# Patient Record
Sex: Female | Born: 1945 | Race: White | Hispanic: No | Marital: Single | Smoking: Never smoker
Health system: Southern US, Community
[De-identification: ages and names within clinical notes are randomized; demographics above are authoritative.]

## PROBLEM LIST (undated history)

## (undated) DIAGNOSIS — E7800 Pure hypercholesterolemia, unspecified: Secondary | ICD-10-CM

## (undated) DIAGNOSIS — R600 Localized edema: Secondary | ICD-10-CM

## (undated) DIAGNOSIS — N289 Disorder of kidney and ureter, unspecified: Secondary | ICD-10-CM

## (undated) DIAGNOSIS — J309 Allergic rhinitis, unspecified: Secondary | ICD-10-CM

## (undated) DIAGNOSIS — K5792 Diverticulitis of intestine, part unspecified, without perforation or abscess without bleeding: Secondary | ICD-10-CM

## (undated) DIAGNOSIS — M1990 Unspecified osteoarthritis, unspecified site: Secondary | ICD-10-CM

## (undated) DIAGNOSIS — K219 Gastro-esophageal reflux disease without esophagitis: Secondary | ICD-10-CM

## (undated) DIAGNOSIS — D500 Iron deficiency anemia secondary to blood loss (chronic): Secondary | ICD-10-CM

## (undated) DIAGNOSIS — R5381 Other malaise: Secondary | ICD-10-CM

## (undated) DIAGNOSIS — K922 Gastrointestinal hemorrhage, unspecified: Secondary | ICD-10-CM

## (undated) DIAGNOSIS — M810 Age-related osteoporosis without current pathological fracture: Secondary | ICD-10-CM

## (undated) DIAGNOSIS — E079 Disorder of thyroid, unspecified: Secondary | ICD-10-CM

## (undated) DIAGNOSIS — F419 Anxiety disorder, unspecified: Secondary | ICD-10-CM

## (undated) DIAGNOSIS — E785 Hyperlipidemia, unspecified: Secondary | ICD-10-CM

## (undated) DIAGNOSIS — E559 Vitamin D deficiency, unspecified: Secondary | ICD-10-CM

## (undated) DIAGNOSIS — G4700 Insomnia, unspecified: Secondary | ICD-10-CM

## (undated) DIAGNOSIS — I82A11 Acute embolism and thrombosis of right axillary vein: Secondary | ICD-10-CM

## (undated) DIAGNOSIS — E46 Unspecified protein-calorie malnutrition: Secondary | ICD-10-CM

## (undated) DIAGNOSIS — K5010 Crohn's disease of large intestine without complications: Secondary | ICD-10-CM

## (undated) DIAGNOSIS — G4730 Sleep apnea, unspecified: Secondary | ICD-10-CM

## (undated) DIAGNOSIS — K5090 Crohn's disease, unspecified, without complications: Secondary | ICD-10-CM

## (undated) HISTORY — DX: Unspecified osteoarthritis, unspecified site: M19.90

## (undated) HISTORY — DX: Disorder of thyroid, unspecified: E07.9

## (undated) HISTORY — DX: Hyperlipidemia, unspecified: E78.5

## (undated) HISTORY — DX: Insomnia, unspecified: G47.00

## (undated) HISTORY — DX: Pure hypercholesterolemia, unspecified: E78.00

## (undated) HISTORY — DX: Diverticulitis of intestine, part unspecified, without perforation or abscess without bleeding: K57.92

## (undated) HISTORY — PX: COLONOSCOPY: SHX174

## (undated) HISTORY — DX: Other malaise: R53.81

## (undated) HISTORY — DX: Crohn's disease, unspecified, without complications: K50.90

## (undated) HISTORY — DX: Localized edema: R60.0

## (undated) HISTORY — DX: Unspecified protein-calorie malnutrition: E46

## (undated) HISTORY — DX: Acute embolism and thrombosis of right axillary vein: I82.A11

## (undated) HISTORY — DX: Anxiety disorder, unspecified: F41.9

## (undated) HISTORY — DX: Vitamin D deficiency, unspecified: E55.9

## (undated) HISTORY — DX: Iron deficiency anemia secondary to blood loss (chronic): D50.0

## (undated) HISTORY — DX: Allergic rhinitis, unspecified: J30.9

## (undated) HISTORY — DX: Age-related osteoporosis without current pathological fracture: M81.0

## (undated) HISTORY — DX: Gastro-esophageal reflux disease without esophagitis: K21.9

## (undated) HISTORY — DX: Sleep apnea, unspecified: G47.30

## (undated) HISTORY — DX: Gastrointestinal hemorrhage, unspecified: K92.2

## (undated) HISTORY — DX: Disorder of kidney and ureter, unspecified: N28.9

---
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
## 1976-02-14 HISTORY — PX: PARTIAL HYSTERECTOMY: SHX80

## 2002-10-28 ENCOUNTER — Ambulatory Visit (HOSPITAL_COMMUNITY): Admission: RE | Admit: 2002-10-28 | Discharge: 2002-10-28 | Payer: Self-pay | Admitting: Pulmonary Disease

## 2002-10-28 ENCOUNTER — Encounter (INDEPENDENT_AMBULATORY_CARE_PROVIDER_SITE_OTHER): Payer: Self-pay | Admitting: *Deleted

## 2002-12-26 ENCOUNTER — Ambulatory Visit (HOSPITAL_COMMUNITY): Admission: RE | Admit: 2002-12-26 | Discharge: 2002-12-26 | Payer: Self-pay | Admitting: Internal Medicine

## 2002-12-26 ENCOUNTER — Encounter: Payer: Self-pay | Admitting: Internal Medicine

## 2002-12-26 ENCOUNTER — Encounter (INDEPENDENT_AMBULATORY_CARE_PROVIDER_SITE_OTHER): Payer: Self-pay

## 2005-02-27 ENCOUNTER — Other Ambulatory Visit: Admission: RE | Admit: 2005-02-27 | Discharge: 2005-02-27 | Payer: Self-pay | Admitting: Family Medicine

## 2006-07-24 ENCOUNTER — Encounter: Payer: Self-pay | Admitting: Internal Medicine

## 2008-03-30 ENCOUNTER — Encounter: Payer: Self-pay | Admitting: Internal Medicine

## 2008-06-22 ENCOUNTER — Encounter: Payer: Self-pay | Admitting: Internal Medicine

## 2008-10-08 ENCOUNTER — Encounter: Payer: Self-pay | Admitting: Internal Medicine

## 2009-04-12 ENCOUNTER — Encounter (INDEPENDENT_AMBULATORY_CARE_PROVIDER_SITE_OTHER): Payer: Self-pay | Admitting: *Deleted

## 2009-05-01 ENCOUNTER — Emergency Department (HOSPITAL_COMMUNITY): Admission: EM | Admit: 2009-05-01 | Discharge: 2009-05-01 | Payer: Self-pay | Admitting: Emergency Medicine

## 2009-06-08 DIAGNOSIS — K5732 Diverticulitis of large intestine without perforation or abscess without bleeding: Secondary | ICD-10-CM

## 2009-06-08 DIAGNOSIS — M1990 Unspecified osteoarthritis, unspecified site: Secondary | ICD-10-CM | POA: Insufficient documentation

## 2009-06-08 DIAGNOSIS — Z860100 Personal history of colon polyps, unspecified: Secondary | ICD-10-CM | POA: Insufficient documentation

## 2009-06-08 DIAGNOSIS — E039 Hypothyroidism, unspecified: Secondary | ICD-10-CM

## 2009-06-08 DIAGNOSIS — E785 Hyperlipidemia, unspecified: Secondary | ICD-10-CM | POA: Insufficient documentation

## 2009-06-08 DIAGNOSIS — K5730 Diverticulosis of large intestine without perforation or abscess without bleeding: Secondary | ICD-10-CM

## 2009-06-08 DIAGNOSIS — F411 Generalized anxiety disorder: Secondary | ICD-10-CM

## 2009-06-08 DIAGNOSIS — K219 Gastro-esophageal reflux disease without esophagitis: Secondary | ICD-10-CM

## 2009-06-08 DIAGNOSIS — E559 Vitamin D deficiency, unspecified: Secondary | ICD-10-CM

## 2010-03-15 NOTE — Letter (Signed)
Summary: New Patient letter  [facility]  [address] [place], [place] [address]   Phone: [phone]  Fax: [phone]       04/12/2009 MRN: [id number]  Jamie Wood [address] [place], [place]  [address]  Dear Ms. Wynelle Link,  Welcome to the Gastroenterology Division at [facility].    You are scheduled to see Dr. Juanda Chance on 06-09-09 at 2:45p.m. on the 3rd floor at [facility], [address]  We ask that you try to arrive at our office 15 minutes prior to your appointment time to allow for check-in.  We would like you to complete the enclosed self-administered evaluation form prior to your visit and bring it with you on the day of your appointment.  We will review it with you.  Also, please bring a complete list of all your medications or, if you prefer, bring the medication bottles and we will list them.  Please bring your insurance card so that we may make a copy of it.  If your insurance requires a referral to see a specialist, please bring your referral form from your primary care physician.  Co-payments are due at the time of your visit and may be paid by cash, check or credit card.     Your office visit will consist of a consult with your physician (includes a physical exam), any laboratory testing he/she may order, scheduling of any necessary diagnostic testing (e.g. x-ray, ultrasound, CT-scan), and scheduling of a procedure (e.g. Endoscopy, Colonoscopy) if required.  Please allow enough time on your schedule to allow for any/all of these possibilities.    If you cannot keep your appointment, please call [phone] to cancel or reschedule prior to your appointment date.  This allows [place] the opportunity to schedule an appointment for another patient in need of care.  If you do not cancel or reschedule by 5 p.m. the business day prior to your appointment date, you will be charged a $50.00 late cancellation/no-show fee.    Thank you for choosing [facility] for your medical needs.  We appreciate the opportunity to care for you.  Please visit [place] at our website  to learn more about our practice.                     Sincerely,                                                             The Gastroenterology Division

## 2010-03-15 NOTE — Procedures (Signed)
Summary: COLON   Colonoscopy  Procedure date:  12/26/2002  Findings:      Location:  [facility].    NAME:  Jamie Wood, Jamie Wood                         ACCOUNT NO.:  [id number]   MEDICAL RECORD NO.:  [id number]                   PATIENT TYPE:  AMB   LOCATION:  ENDO                                 FACILITY:  [facility]   PHYSICIAN:  Lina Sar, M.D. [facility]               DATE OF BIRTH:  1946/01/26   DATE OF PROCEDURE:  12/26/2002  DATE OF DISCHARGE:                                 OPERATIVE REPORT   PROCEDURE:  Colonoscopy.   GASTROENTEROLOGIST:  Lina Sar, M.D.   INDICATIONS:  This 65 year old white female has had two documented episodes  of diverticulitis, one eight years ago and one this year in September 2004.  She has residual left lower quadrant tenderness, but she has been off  antibiotics now for six weeks.  She is undergoing colonoscopy to further  evaluate her diverticulosis.   ENDOSCOPE:  Fujinon single-channel video endoscope.   SEDATION:  Versed 10 mg, IV fentanyl 100 mcg IV.   FINDINGS:  Fujinon single-channel video endoscope passed under direct vision  into the rectum into the sigmoid colon.  The patient was monitored by pulse  oximetry. Oxygen saturations were normal.   The rectal ampulla was normal.  Her prep was excellent.  There was severe  diverticulosis of the sigmoid colon between 20 and 40 cm from the rectal  opening.  Lumen was narrow, tortuous with large folds obscuring deep  diverticula.  It was difficult to negotiate through, I actually had to exert  some pressure on the endoscope for it to traverse through the narrow portion  of the colon.  The patient had some discomfort as the sigmoid colon was  negotiated with the endoscope.  Once the endoscope traversed into the  descending duodenum, it appeared to have normal size, and there were  scattered diverticulum throughout the splenic flexure.  Transverse colon,  hepatic flexure, and ascending colon  were traversed without difficulty.  Cecal pouch and ileocecal valve were normal.  Colonoscope was then slowly  retracted through the right to the left colon.  At the level of 20 cm from  the right colon was noted a tiny diminutive polyp measuring about 3 to 4 mm  which was ablated with cold biopsy and sent to pathology.  It was not clear  whether this was actual polyp or inverted diverticulum.   IMPRESSION:  1. Severe diverticulosis of the sigmoid colon with partial narrowing of the     colon lumen.  No evidence of acute diverticulitis.  2. Diminutive polyp of the left colon status post polypectomy.    PLAN:  The patient may be able to increase the fiber content of her diet now  and continue taking Metamucil on a daily basis.  She also should have  available Levsin sublingually 0.125 mg  for antispasmodic effect.  The  patient may in the future require segmental resection of the sigmoid, but at  this point she is doing well and continue conservative treatment.                                               Lina Sar, M.D. [facility]    DB/MEDQ  D:  12/26/2002  T:  12/26/2002  Job:  [id number]   cc:   Talmadge Coventry, M.D.  [address]  [place]  [place] [address]  Fax: [phone]  This report was created from the original endoscopy report, which was reviewed and signed by the above listed endoscopist.    FINAL DIAGNOSIS    ***MICROSCOPIC EXAMINATION AND DIAGNOSIS***    COLON: HYPERPLASTIC POLYP(S). NO ADENOMATOUS CHANGE OR   MALIGNANCY IDENTIFIED.    mw   Date Reported: 12/29/2002 Marcie Bal, MD   *** Electronically Signed Out By TAZ ***    Clinical information   History of diverticulitis. (ac)    specimen(s) obtained   Colon, polyp(s)    Gross Description   Received in formalin is a tan, soft tissue fragment that is   submitted in toto. Size: 0.2 cm   (GP:jes, 12/29/02)    jes/

## 2010-03-15 NOTE — Letter (Signed)
[facility]: [facility]   Imported By: Sherian Rein 06/08/2009 14:00:54  _____________________________________________________________________  External Attachment:    Type:   Image     Comment:   External Document

## 2010-03-15 NOTE — Letter (Signed)
Summary: [facility]   Imported By: Sherian Rein 06/08/2009 14:01:47  _____________________________________________________________________  External Attachment:    Type:   Image     Comment:   External Document

## 2010-07-01 NOTE — Op Note (Signed)
NAME:  Jamie Wood, Jamie Wood                         ACCOUNT NO.:  [id number]   MEDICAL RECORD NO.:  [id number]                   PATIENT TYPE:  AMB   LOCATION:  ENDO                                 FACILITY:  [facility]   PHYSICIAN:  Lina Sar, M.D. [facility]               DATE OF BIRTH:  05/23/1945   DATE OF PROCEDURE:  12/26/2002  DATE OF DISCHARGE:                                 OPERATIVE REPORT   PROCEDURE:  Colonoscopy.   GASTROENTEROLOGIST:  Lina Sar, M.D.   INDICATIONS:  This 65 year old white female has had two documented episodes  of diverticulitis, one eight years ago and one this year in September 2004.  She has residual left lower quadrant tenderness, but she has been off  antibiotics now for six weeks.  She is undergoing colonoscopy to further  evaluate her diverticulosis.   ENDOSCOPE:  Fujinon single-channel video endoscope.   SEDATION:  Versed 10 mg, IV fentanyl 100 mcg IV.   FINDINGS:  Fujinon single-channel video endoscope passed under direct vision  into the rectum into the sigmoid colon.  The patient was monitored by pulse  oximetry. Oxygen saturations were normal.   The rectal ampulla was normal.  Her prep was excellent.  There was severe  diverticulosis of the sigmoid colon between 20 and 40 cm from the rectal  opening.  Lumen was narrow, tortuous with large folds obscuring deep  diverticula.  It was difficult to negotiate through, I actually had to exert  some pressure on the endoscope for it to traverse through the narrow portion  of the colon.  The patient had some discomfort as the sigmoid colon was  negotiated with the endoscope.  Once the endoscope traversed into the  descending duodenum, it appeared to have normal size, and there were  scattered diverticulum throughout the splenic flexure.  Transverse colon,  hepatic flexure, and ascending colon were traversed without difficulty.  Cecal pouch and ileocecal valve were normal.  Colonoscope was then slowly  retracted through the right to the left colon.  At the level of 20 cm from  the right colon was noted a tiny diminutive polyp measuring about 3 to 4 mm  which was ablated with cold biopsy and sent to pathology.  It was not clear  whether this was actual polyp or inverted diverticulum.   IMPRESSION:  1. Severe diverticulosis of the sigmoid colon with partial narrowing of the     colon lumen.  No evidence of acute diverticulitis.  2. Diminutive polyp of the left colon status post polypectomy.    PLAN:  The patient may be able to increase the fiber content of her diet now  and continue taking Metamucil on a daily basis.  She also should have  available Levsin sublingually 0.125 mg for antispasmodic effect.  The  patient may in the future require segmental resection of the sigmoid, but at  this point she is doing  well and continue conservative treatment.                                               Lina Sar, M.D. [facility]    DB/MEDQ  D:  12/26/2002  T:  12/26/2002  Job:  [id number]   cc:   Talmadge Coventry, M.D.  [address]  [place]  [place] [address]  Fax: [phone]

## 2010-10-13 ENCOUNTER — Other Ambulatory Visit: Payer: Self-pay | Admitting: Family Medicine

## 2010-10-13 DIAGNOSIS — R1032 Left lower quadrant pain: Secondary | ICD-10-CM

## 2010-10-18 ENCOUNTER — Ambulatory Visit
Admission: RE | Admit: 2010-10-18 | Discharge: 2010-10-18 | Disposition: A | Discharge status: Home / Self Care | Payer: Federal, State, Local not specified - PPO | Source: Ambulatory Visit | Attending: Family Medicine | Admitting: Family Medicine

## 2010-10-18 DIAGNOSIS — R1032 Left lower quadrant pain: Secondary | ICD-10-CM

## 2010-10-18 MED ORDER — IOHEXOL 300 MG/ML  SOLN: 125.0000 mL | Freq: Once | INTRAMUSCULAR | Status: AC | PRN

## 2012-08-20 ENCOUNTER — Ambulatory Visit: Payer: Self-pay | Admitting: Neurology

## 2012-09-30 ENCOUNTER — Encounter: Payer: Self-pay | Admitting: Internal Medicine

## 2012-10-01 ENCOUNTER — Encounter: Payer: Self-pay | Admitting: Internal Medicine

## 2012-12-04 ENCOUNTER — Ambulatory Visit (AMBULATORY_SURGERY_CENTER): Payer: Self-pay

## 2012-12-04 VITALS — Ht 63.25 in | Wt 215.0 lb

## 2012-12-04 DIAGNOSIS — Z860100 Personal history of colon polyps, unspecified: Secondary | ICD-10-CM

## 2012-12-04 MED ORDER — MOVIPREP 100 G PO SOLR: 1.0000 | Freq: Once | ORAL | Status: DC

## 2012-12-18 ENCOUNTER — Ambulatory Visit (AMBULATORY_SURGERY_CENTER): Payer: Medicare Other | Admitting: Internal Medicine

## 2012-12-18 ENCOUNTER — Encounter: Payer: Self-pay | Admitting: Internal Medicine

## 2012-12-18 VITALS — BP 127/73 | HR 68 | Temp 97.0°F | Resp 13 | Ht 63.0 in | Wt 215.0 lb

## 2012-12-18 DIAGNOSIS — Z1211 Encounter for screening for malignant neoplasm of colon: Secondary | ICD-10-CM

## 2012-12-18 DIAGNOSIS — Z86010 Personal history of colonic polyps: Secondary | ICD-10-CM

## 2012-12-18 MED ORDER — SODIUM CHLORIDE 0.9 % IV SOLN: 500.0000 mL | INTRAVENOUS | Status: DC

## 2012-12-18 NOTE — Op Note (Signed)
[facility] [address] [place], [address]   COLONOSCOPY PROCEDURE REPORT  PATIENT: Jamie Wood, Jamie Wood  MR#: [id number] BIRTHDATE: [birth year] , 67  yrs. old GENDER: Female ENDOSCOPIST: Hart Carwin, MD REFERRED HY:QMVHQIO Cliffton Asters, M.D. PROCEDURE DATE:  12/18/2012 PROCEDURE:   Colonoscopy, screening First Screening Colonoscopy - Avg.  risk and is 50 yrs.  old or older - No.  Prior Negative Screening - Now for repeat screening. N/A  History of Adenoma - Now for follow-up colonoscopy & has been > or = to 3 yrs.  N/A  Polyps Removed Today? No.  Recommend repeat exam, <10 yrs? No. ASA CLASS:   Class II INDICATIONS:Average risk patient for colon cancer. , last colon 2004- MEDICATIONS: MAC sedation, administered by CRNA and propofol (Diprivan) 150mg  IV  DESCRIPTION OF PROCEDURE:   After the risks benefits and alternatives of the procedure were thoroughly explained, informed consent was obtained.  A digital rectal exam revealed no abnormalities of the rectum.   The LB [id number]-[id number]  endoscope was introduced through the anus and advanced to the cecum, which was identified by both the appendix and ileocecal valve. No adverse events experienced.   The quality of the prep was good, using MoviPrep  The instrument was then slowly withdrawn as the colon was fully examined.      COLON FINDINGS: Small internal hemorrhoids were found.   There was moderate diverticulosis noted in the sigmoid colon with associated muscular hypertrophy.  Retroflexed views revealed no abnormalities. The time to cecum=4 minutes 15 seconds.  Withdrawal time=6 minutes 15 seconds.  The scope was withdrawn and the procedure completed. COMPLICATIONS: There were no complications.  ENDOSCOPIC IMPRESSION: 1.   Small internal hemorrhoids 2.   There was moderate diverticulosis noted in the sigmoid colon  RECOMMENDATIONS: 1.  High fiber diet 2.   recall colonoscopy in 10 years   eSigned:  Hart Carwin, MD 12/18/2012 10:58 AM   cc:   PATIENT NAME:  [name], [name] MR#: [id number]

## 2012-12-18 NOTE — Progress Notes (Signed)
Patient did not experience any of the following events: a burn prior to discharge; a fall within the facility; wrong site/side/patient/procedure/implant event; or a hospital transfer or hospital admission upon discharge from the facility. (G8907) Patient did not have preoperative order for IV antibiotic SSI prophylaxis. (G8918)  

## 2012-12-18 NOTE — Progress Notes (Signed)
Lidocaine-40mg IV prior to Propofol InductionPropofol given over incremental dosages 

## 2012-12-18 NOTE — Patient Instructions (Signed)
YOU HAD AN ENDOSCOPIC PROCEDURE TODAY AT THE [facility]: Refer to the procedure report that was given to you for any specific questions about what was found during the examination.  If the procedure report does not answer your questions, please call your gastroenterologist to clarify.  If you requested that your care partner not be given the details of your procedure findings, then the procedure report has been included in a sealed envelope for you to review at your convenience later.  YOU SHOULD EXPECT: Some feelings of bloating in the abdomen. Passage of more gas than usual.  Walking can help get rid of the air that was put into your GI tract during the procedure and reduce the bloating. If you had a lower endoscopy (such as a colonoscopy or flexible sigmoidoscopy) you may notice spotting of blood in your stool or on the toilet paper. If you underwent a bowel prep for your procedure, then you may not have a normal bowel movement for a few days.  DIET: Your first meal following the procedure should be a light meal and then it is ok to progress to your normal diet.  A half-sandwich or bowl of soup is an example of a good first meal.  Heavy or fried foods are harder to digest and may make you feel nauseous or bloated.  Likewise meals heavy in dairy and vegetables can cause extra gas to form and this can also increase the bloating.  Drink plenty of fluids but you should avoid alcoholic beverages for 24 hours.  ACTIVITY: Your care partner should take you home directly after the procedure.  You should plan to take it easy, moving slowly for the rest of the day.  You can resume normal activity the day after the procedure however you should NOT DRIVE or use heavy machinery for 24 hours (because of the sedation medicines used during the test).    SYMPTOMS TO REPORT IMMEDIATELY: A gastroenterologist can be reached at any hour.  During normal business hours, 8:30 AM to 5:00 PM Monday through Friday,  call [phone].  After hours and on weekends, please call the GI answering service at [phone] who will take a message and have the physician on call contact you.   Following lower endoscopy (colonoscopy or flexible sigmoidoscopy):  Excessive amounts of blood in the stool  Significant tenderness or worsening of abdominal pains  Swelling of the abdomen that is new, acute  Fever of 100F or higher    FOLLOW UP: If any biopsies were taken you will be contacted by phone or by letter within the next 1-3 weeks.  Call your gastroenterologist if you have not heard about the biopsies in 3 weeks.  Our staff will call the home number listed on your records the next business day following your procedure to check on you and address any questions or concerns that you may have at that time regarding the information given to you following your procedure. This is a courtesy call and so if there is no answer at the home number and we have not heard from you through the emergency physician on call, we will assume that you have returned to your regular daily activities without incident.  SIGNATURES/CONFIDENTIALITY: You and/or your care partner have signed paperwork which will be entered into your electronic medical record.  These signatures attest to the fact that that the information above on your After Visit Summary has been reviewed and is understood.  Full responsibility of the confidentiality   of this discharge information lies with you and/or your care-partner.     

## 2012-12-19 ENCOUNTER — Telehealth: Payer: Self-pay | Admitting: *Deleted

## 2012-12-19 NOTE — Telephone Encounter (Signed)
  Follow up Call-  Call back number 12/18/2012  Post procedure Call Back phone  # [phone]  Permission to leave phone message Yes     Patient questions:  Do you have a fever, pain , or abdominal swelling? no Pain Score  0 *  Have you tolerated food without any problems? yes  Have you been able to return to your normal activities? yes  Do you have any questions about your discharge instructions: Diet   no Medications  no Follow up visit  no  Do you have questions or concerns about your Care? no  Actions: * If pain score is 4 or above: No action needed, pain <4.

## 2015-06-25 ENCOUNTER — Emergency Department (HOSPITAL_COMMUNITY)
Admission: EM | Admit: 2015-06-25 | Discharge: 2015-06-25 | Disposition: A | Discharge status: Home / Self Care | Payer: Medicare Other | Attending: Emergency Medicine | Admitting: Emergency Medicine

## 2015-06-25 ENCOUNTER — Emergency Department (HOSPITAL_COMMUNITY): Payer: Medicare Other

## 2015-06-25 ENCOUNTER — Encounter (HOSPITAL_COMMUNITY): Payer: Self-pay

## 2015-06-25 DIAGNOSIS — K529 Noninfective gastroenteritis and colitis, unspecified: Secondary | ICD-10-CM | POA: Diagnosis not present

## 2015-06-25 DIAGNOSIS — K219 Gastro-esophageal reflux disease without esophagitis: Secondary | ICD-10-CM | POA: Diagnosis not present

## 2015-06-25 DIAGNOSIS — Z7952 Long term (current) use of systemic steroids: Secondary | ICD-10-CM | POA: Insufficient documentation

## 2015-06-25 DIAGNOSIS — R197 Diarrhea, unspecified: Secondary | ICD-10-CM

## 2015-06-25 DIAGNOSIS — N183 Chronic kidney disease, stage 3 (moderate): Secondary | ICD-10-CM | POA: Diagnosis not present

## 2015-06-25 DIAGNOSIS — R1030 Lower abdominal pain, unspecified: Secondary | ICD-10-CM | POA: Diagnosis present

## 2015-06-25 DIAGNOSIS — Z792 Long term (current) use of antibiotics: Secondary | ICD-10-CM | POA: Insufficient documentation

## 2015-06-25 DIAGNOSIS — E7800 Pure hypercholesterolemia, unspecified: Secondary | ICD-10-CM | POA: Diagnosis not present

## 2015-06-25 DIAGNOSIS — Z79891 Long term (current) use of opiate analgesic: Secondary | ICD-10-CM | POA: Insufficient documentation

## 2015-06-25 DIAGNOSIS — Z7982 Long term (current) use of aspirin: Secondary | ICD-10-CM | POA: Diagnosis not present

## 2015-06-25 DIAGNOSIS — R195 Other fecal abnormalities: Secondary | ICD-10-CM | POA: Diagnosis not present

## 2015-06-25 DIAGNOSIS — Z79899 Other long term (current) drug therapy: Secondary | ICD-10-CM | POA: Diagnosis not present

## 2015-06-25 LAB — CBC WITH DIFFERENTIAL/PLATELET
Basophils Absolute: 0 10*3/uL (ref 0.0–0.1)
Basophils Relative: 0 %
Eosinophils Absolute: 0.1 10*3/uL (ref 0.0–0.7)
Eosinophils Relative: 1 %
HCT: 41.4 % (ref 36.0–46.0)
Hemoglobin: 14.5 g/dL (ref 12.0–15.0)
Lymphocytes Relative: 11 %
Lymphs Abs: 1.4 10*3/uL (ref 0.7–4.0)
MCH: 31.3 pg (ref 26.0–34.0)
MCHC: 35 g/dL (ref 30.0–36.0)
MCV: 89.2 fL (ref 78.0–100.0)
Monocytes Absolute: 1.3 10*3/uL — ABNORMAL HIGH (ref 0.1–1.0)
Monocytes Relative: 11 %
Neutro Abs: 9.5 10*3/uL — ABNORMAL HIGH (ref 1.7–7.7)
Neutrophils Relative %: 77 %
Platelets: 260 10*3/uL (ref 150–400)
RBC: 4.64 MIL/uL (ref 3.87–5.11)
RDW: 13.6 % (ref 11.5–15.5)
WBC: 12.3 10*3/uL — ABNORMAL HIGH (ref 4.0–10.5)

## 2015-06-25 LAB — URINALYSIS, ROUTINE W REFLEX MICROSCOPIC
Glucose, UA: NEGATIVE mg/dL
Hgb urine dipstick: NEGATIVE
Ketones, ur: 40 mg/dL — AB
Leukocytes, UA: NEGATIVE
Nitrite: NEGATIVE
Protein, ur: NEGATIVE mg/dL
Specific Gravity, Urine: >1.046 — ABNORMAL HIGH (ref 1.005–1.030)
pH: 5.5 (ref 5.0–8.0)

## 2015-06-25 LAB — COMPREHENSIVE METABOLIC PANEL
ALT: 17 U/L (ref 14–54)
AST: 14 U/L — ABNORMAL LOW (ref 15–41)
Albumin: 3.1 g/dL — ABNORMAL LOW (ref 3.5–5.0)
Alkaline Phosphatase: 75 U/L (ref 38–126)
Anion gap: 15 (ref 5–15)
BUN: 13 mg/dL (ref 6–20)
CO2: 21 mmol/L — ABNORMAL LOW (ref 22–32)
Calcium: 8.5 mg/dL — ABNORMAL LOW (ref 8.9–10.3)
Chloride: 107 mmol/L (ref 101–111)
Creatinine, Ser: 1.09 mg/dL — ABNORMAL HIGH (ref 0.44–1.00)
GFR calc Af Amer: 59 mL/min — ABNORMAL LOW (ref >60)
GFR calc non Af Amer: 51 mL/min — ABNORMAL LOW (ref >60)
Glucose, Bld: 144 mg/dL — ABNORMAL HIGH (ref 65–99)
Potassium: 3.1 mmol/L — ABNORMAL LOW (ref 3.5–5.1)
Sodium: 143 mmol/L (ref 135–145)
Total Bilirubin: 1 mg/dL (ref 0.3–1.2)
Total Protein: 6.2 g/dL — ABNORMAL LOW (ref 6.5–8.1)

## 2015-06-25 LAB — LIPASE, BLOOD: Lipase: 25 U/L (ref 11–51)

## 2015-06-25 LAB — POC OCCULT BLOOD, ED: Fecal Occult Bld: POSITIVE — AB

## 2015-06-25 MED ORDER — DIATRIZOATE MEGLUMINE & SODIUM 66-10 % PO SOLN: 15.0000 mL | ORAL | Status: DC | PRN

## 2015-06-25 MED ORDER — ONDANSETRON HCL 4 MG/2ML IJ SOLN
4.0000 mg | Freq: Once | INTRAMUSCULAR | Status: AC
  Administered 2015-06-25: 4 mg via INTRAVENOUS
  Filled 2015-06-25: qty 2

## 2015-06-25 MED ORDER — MORPHINE SULFATE (PF) 4 MG/ML IV SOLN
6.0000 mg | Freq: Once | INTRAVENOUS | Status: AC
  Administered 2015-06-25: 6 mg via INTRAVENOUS
  Filled 2015-06-25: qty 2

## 2015-06-25 MED ORDER — HYDROCODONE-ACETAMINOPHEN 5-325 MG PO TABS: 1.0000 | ORAL_TABLET | ORAL | Status: DC | PRN

## 2015-06-25 MED ORDER — IOPAMIDOL (ISOVUE-300) INJECTION 61%
100.0000 mL | Freq: Once | INTRAVENOUS | Status: AC | PRN
  Administered 2015-06-25: 100 mL via INTRAVENOUS

## 2015-06-25 MED ORDER — SODIUM CHLORIDE 0.9 % IV BOLUS (SEPSIS)
1000.0000 mL | Freq: Once | INTRAVENOUS | Status: AC
  Administered 2015-06-25: 1000 mL via INTRAVENOUS

## 2015-06-25 MED ORDER — METRONIDAZOLE 500 MG PO TABS: 500.0000 mg | ORAL_TABLET | Freq: Three times a day (TID) | ORAL | Status: DC

## 2015-06-25 NOTE — ED Provider Notes (Signed)
CSN: [id number]     Arrival date & time 06/25/15  4098 History   First MD Initiated Contact with Patient 06/25/15 [phone]     Chief Complaint  Patient presents with  . Abdominal Pain  . Diarrhea     (Consider location/radiation/quality/duration/timing/severity/associated sxs/prior Treatment) HPI Comments: Patient is a 70 year old female with history of diverticulitis who presents with 1 week of lower abdominal pain, anorexia, and diarrhea. Patient states she was seen by her primary care provider on Tuesday who diagnosed her clinically with diverticulitis. The patient was started on Cipro. Patient continues to have symptoms that are worsening. She describes her lower abdominal pain as a band across her lower abdomen. She describes the pain is constant, and sharp, however it is improved with bowel movement. She rates her pain as a 5/10. The patient reported that she had a entirely bloody bowel movement this morning. She describes it as bright red. Earlier in the week she said it started as rust colored. Patient states she has been having diarrhea 4-5 times per hour. Patient states she was nauseous last Saturday, but denies nausea vomiting since. Patient states she had a fever of 104 2 nights ago. Patient states she was recently on doxycycline for 9 days for an abscess that was drained. Patient has been on a liquid diet. Patient denies chest pain, shortness of breath, nausea, vomiting, dysuria.  Patient is a 70 y.o. female presenting with abdominal pain and diarrhea. The history is provided by the patient.  Abdominal Pain Associated symptoms: diarrhea   Associated symptoms: no chest pain, no chills, no dysuria, no fever, no nausea, no shortness of breath, no sore throat and no vomiting   Diarrhea Associated symptoms: abdominal pain   Associated symptoms: no chills, no fever, no headaches and no vomiting     Past Medical History  Diagnosis Date  . Thyroid disease   . Hypercholesterolemia   .  Arthritis   . GERD (gastroesophageal reflux disease)   . Diverticulitis   . Sleep apnea   . Renal insufficiency     stage 3 kidney disease   Past Surgical History  Procedure Laterality Date  . Partial hysterectomy  1978    vaginal  . Tonsillectomy  1967  . Appendectomy  1957   Family History  Problem Relation Age of Onset  . Colon cancer Neg Hx   . Pancreatic cancer Neg Hx   . Stomach cancer Neg Hx    Social History  Substance Use Topics  . Smoking status: Never Smoker   . Smokeless tobacco: Never Used  . Alcohol Use: No   OB History    No data available     Review of Systems  Constitutional: Negative for fever and chills.  HENT: Negative for facial swelling and sore throat.   Respiratory: Negative for shortness of breath.   Cardiovascular: Negative for chest pain.  Gastrointestinal: Positive for abdominal pain and diarrhea. Negative for nausea and vomiting.  Genitourinary: Negative for dysuria.  Musculoskeletal: Negative for back pain.  Skin: Negative for rash and wound.  Neurological: Negative for headaches.  Psychiatric/Behavioral: The patient is not nervous/anxious.       Allergies  Codeine; Penicillins; Propoxyphene hcl; and Tramadol  Home Medications   Prior to Admission medications   Medication Sig Start Date End Date Taking? Authorizing Provider  ALPRAZolam (XANAX) 0.25 MG tablet Take 0.25 mg by mouth daily.   Yes Historical Provider, MD  aspirin 81 MG tablet Take 81 mg by mouth daily.  Yes Historical Provider, MD  ciprofloxacin (CIPRO) 500 MG tablet Take 500 mg by mouth 2 (two) times daily. 06/22/15-07/01/15 06/22/15  Yes Historical Provider, MD  clobetasol cream (TEMOVATE) 0.05 % Apply 1 application topically 2 (two) times daily as needed (for rash, itching, irritation on hands).    Yes Historical Provider, MD  doxycycline (VIBRA-TABS) 100 MG tablet Take 100 mg by mouth 2 (two) times daily. 10 day course stopped by MD on day 9  06/13/15-06/22/15 06/13/15  Yes  Historical Provider, MD  fluticasone (VERAMYST) 27.5 MCG/SPRAY nasal spray Place 2 sprays into the nose daily as needed for rhinitis or allergies.    Yes Historical Provider, MD  HYDROcodone-acetaminophen (NORCO/VICODIN) 5-325 MG tablet Take 1-2 tablets by mouth every 4 (four) hours as needed. 06/25/15   Emi Holes, PA-C  levothyroxine (SYNTHROID, LEVOTHROID) 100 MCG tablet Take 100 mcg by mouth daily before breakfast.   Yes Historical Provider, MD  metroNIDAZOLE (FLAGYL) 500 MG tablet Take 1 tablet (500 mg total) by mouth 3 (three) times daily. 06/25/15   Emi Holes, PA-C  pantoprazole (PROTONIX) 40 MG tablet Take 40 mg by mouth daily.   Yes Historical Provider, MD  simvastatin (ZOCOR) 20 MG tablet Take 20 mg by mouth every evening.   Yes Historical Provider, MD  VITAMIN D, CHOLECALCIFEROL, PO Take 5,000 Units by mouth.   Yes Historical Provider, MD   BP 134/77 mmHg  Pulse 96  Temp(Src) 99.2 F (37.3 C) (Oral)  Resp 14  Ht 5\' 4"  (1.626 m)  Wt 106.142 kg  BMI 40.15 kg/m2  SpO2 98% Physical Exam  Constitutional: She appears well-developed and well-nourished. No distress.  HENT:  Head: Normocephalic and atraumatic.  Mouth/Throat: Oropharynx is clear and moist. No oropharyngeal exudate.  Eyes: Conjunctivae are normal. Pupils are equal, round, and reactive to light. Right eye exhibits no discharge. Left eye exhibits no discharge. No scleral icterus.  Neck: Normal range of motion. Neck supple. No thyromegaly present.  Cardiovascular: Normal rate, regular rhythm, normal heart sounds and intact distal pulses.  Exam reveals no gallop and no friction rub.   No murmur heard. Pulmonary/Chest: Effort normal and breath sounds normal. No stridor. No respiratory distress. She has no wheezes. She has no rales.  Abdominal: Soft. Bowel sounds are normal. She exhibits no distension. There is tenderness in the periumbilical area and left lower quadrant. There is no rebound, no guarding and negative  Murphy's sign.    Tenderness to lower abdomen and periumbilical, especially LLQ  Musculoskeletal: She exhibits no edema.  Lymphadenopathy:    She has no cervical adenopathy.  Neurological: She is alert. Coordination normal.  Skin: Skin is warm and dry. No rash noted. She is not diaphoretic. No pallor.  Psychiatric: She has a normal mood and affect.  Nursing note and vitals reviewed.   ED Course  Procedures (including critical care time) Labs Review Labs Reviewed  COMPREHENSIVE METABOLIC PANEL - Abnormal; Notable for the following:    Potassium 3.1 (*)    CO2 21 (*)    Glucose, Bld 144 (*)    Creatinine, Ser 1.09 (*)    Calcium 8.5 (*)    Total Protein 6.2 (*)    Albumin 3.1 (*)    AST 14 (*)    GFR calc non Af Amer 51 (*)    GFR calc Af Amer 59 (*)    All other components within normal limits  CBC WITH DIFFERENTIAL/PLATELET - Abnormal; Notable for the following:    WBC  12.3 (*)    Neutro Abs 9.5 (*)    Monocytes Absolute 1.3 (*)    All other components within normal limits  URINALYSIS, ROUTINE W REFLEX MICROSCOPIC (NOT AT [facility]) - Abnormal; Notable for the following:    Color, Urine AMBER (*)    Specific Gravity, Urine >1.046 (*)    Bilirubin Urine SMALL (*)    Ketones, ur 40 (*)    All other components within normal limits  POC OCCULT BLOOD, ED - Abnormal; Notable for the following:    Fecal Occult Bld POSITIVE (*)    All other components within normal limits  URINE CULTURE  GASTROINTESTINAL PANEL BY PCR, STOOL (REPLACES STOOL CULTURE)  LIPASE, BLOOD    Imaging Review Ct Abdomen Pelvis W Contrast  06/25/2015  CLINICAL DATA:  Bright red diarrhea this morning. Lower abdominal pain. EXAM: CT ABDOMEN AND PELVIS WITH CONTRAST TECHNIQUE: Multidetector CT imaging of the abdomen and pelvis was performed using the standard protocol following bolus administration of intravenous contrast. CONTRAST:  ISOVUE-300 IOPAMIDOL (ISOVUE-300) INJECTION 61% COMPARISON:  10/18/2010  FINDINGS: Lower chest: Stable 5 mm nodule in the right middle lobe on sequence 4, image 10 appears to have a few calcifications and probably represents a benign granuloma. Stable punctate nodular density near the right minor fissure on sequence 4, image 4. Small amount atelectasis or scarring at the right lung base. Hepatobiliary: Normal appearance of the liver, gallbladder and portal venous system. Pancreas: Fatty changes throughout the pancreas without evidence for inflammation or duct dilatation. Spleen: Normal appearance of spleen without enlargement Adrenals/Urinary Tract: Normal bilateral adrenal glands. There are bilateral peripelvic cysts without hydronephrosis. Normal urinary bladder. There is probably a punctate nonobstructive right kidney stone in lower pole. Stomach/Bowel: There is extensive colon wall thickening, predominantly involving the transverse colon and hepatic flexure. There is small amount of pericolonic edema near the hepatic flexure. There is extensive diverticulosis involving the descending colon and the sigmoid colon. Difficult to exclude mild inflammation in the descending colon. There is oral contrast in the small bowel and right colon. No evidence for a small bowel obstruction. Normal appearance of the stomach and duodenum. Evidence for vascular engorgement in the omentum. Vascular/Lymphatic: Normal caliber of the abdominal aorta without significant atherosclerotic disease. Few scattered periaortic lymph nodes have not significantly changed. There are prominent lymph nodes along the lymphatic drainage of the sigmoid colon on sequence 2, images 66 and 63. There are enlarged lymph nodes near the left iliac chain which could also be along the lymphatic drainage of the left colon. These lymph nodes are best seen on sequence 2, image 59 and 60. Largest node measures 0.9 cm in the short axis. Reproductive: Uterus has been removed. Evidence for bilateral ovarian tissue without gross  abnormality. Other: Trace amount of free fluid in the pelvis.  No free air. Musculoskeletal: Stable sclerotic density in the right ilium is suggestive for a bone island. No suspicious bone findings. IMPRESSION: Diffuse colonic wall thickening which is most prominent in the transverse colon and hepatic flexure. Findings are suggestive for colitis. This could be an infectious or inflammatory process. There is a small amount of fluid in the pelvis. Extensive diverticulosis involving the sigmoid colon and left colon but this does not appear to be the source for the colonic inflammation. Prominent lymph nodes along the lymphatic distribution of the sigmoid colon could be reactive. These nodes have enlarged since 2012 and consider a 3-6 month follow-up for surveillance. Bilateral renal cysts without hydronephrosis. Electronically  Signed   By: Richarda Overlie M.D.   On: 06/25/2015 10:41   I have personally reviewed and evaluated these images and lab results as part of my medical decision-making.   EKG Interpretation None      Pain completely resolved with morphine.  MDM   UA shows elevated specific gravity, small bilirubin, 40 ketones. Urine culture sent. Lipase 25. CMP shows potassium 3.1, CO2 21, glucose 144, creatinine is 1.09, calcium 8.5, protein 6.2, albumin 3.1, AST 14. CBC shows WBC 12.3. CT abdomen pelvis shows diffuse colonic wall thickening in the transverse colon and hepatic flexure suggestive for colitis; extensive diverticulosis involving the sigmoid colon and left colon not appearing to be source for colonic inflammation; prominent lymph nodes along the lymphatic distribution of the sigmoid colon could be reactive, recommending 3-6 month follow-up for surveillance; bilateral renal cysts without hydronephrosis. We'll discharge patient with added Flagyl to have more complete coverage for colitis. Patient encouraged to continue a liquid diet until abdominal pain has improved. Strict for return  precautions given and outlined in discharge paperwork. Patient will return with a stool specimen for C. difficile PCR. Patient to follow-up with PCP in 2-3 days. Patient discharged with short course of Norco. Patient also evaluated by Dr. Juleen China who is in agreement with plan. Patient vitals stable throughout ED course and in satisfactory condition at discharge.  Final diagnoses:  Colitis  Lower abdominal pain  Diarrhea, unspecified type  Occult blood in stools       Emi Holes, PA-C 06/25/15 1419  Raeford Razor, MD 07/01/15 [phone]

## 2015-06-25 NOTE — ED Notes (Signed)
Bed: RV61 Expected date:  Expected time:  Means of arrival:  Comments: EMS 69yo lower abd pain

## 2015-06-25 NOTE — Discharge Instructions (Signed)
Medications: Flagyl, Norco  Treatment: Add Flagyl to your antibiotic regimen as prescribed. Take Norco every 4 hours as needed for severe pain. Try to hydrate as much as possible. Continue your liquid diet until you are feeling better and are cleared by your primary care provider.  Follow-up: Please follow-up with your primary care provider in 2-3 days for follow-up and further evaluation and treatment of your symptoms. Your CT scan indicated that you have enlarged lymph nodes on your sigmoid colon in comparison to the last CT scan in 2012. It is recommended by the radiologist that you have follow-up and reevaluation of these in 3-6 months, although these could be reactive to the inflammatory process causing your symptoms today. Please return to emergency department if you develop any new or worsening symptoms, including increasing bleeding, or you cannot tolerate your medications or stay hydrated.   Colitis Colitis is inflammation of the colon. Colitis may last a short time (acute) or it may last a long time (chronic). CAUSES This condition may be caused by:  Viruses.  Bacteria.  Reactions to medicine.  Certain autoimmune diseases, such as Crohn disease or ulcerative colitis. SYMPTOMS Symptoms of this condition include:  Diarrhea.  Passing bloody or tarry stool.  Pain.  Fever.  Vomiting.  Tiredness (fatigue).  Weight loss.  Bloating.  Sudden increase in abdominal pain.  Having fewer bowel movements than usual. DIAGNOSIS This condition is diagnosed with a stool test or a blood test. You may also have other tests, including X-rays, a CT scan, or a colonoscopy. TREATMENT Treatment may include:  Resting the bowel. This involves not eating or drinking for a period of time.  Fluids that are given through an IV tube.  Medicine for pain and diarrhea.  Antibiotic medicines.  Cortisone medicines.  Surgery. HOME CARE INSTRUCTIONS Eating and Drinking  Follow  instructions from your health care provider about eating or drinking restrictions.  Drink enough fluid to keep your urine clear or pale yellow.  Work with a dietitian to determine which foods cause your condition to flare up.  Avoid foods that cause flare-ups.  Eat a well-balanced diet. Medicines  Take over-the-counter and prescription medicines only as told by your health care provider.  If you were prescribed an antibiotic medicine, take it as told by your health care provider. Do not stop taking the antibiotic even if you start to feel better. General Instructions  Keep all follow-up visits as told by your health care provider. This is important. SEEK MEDICAL CARE IF:  Your symptoms do not go away.  You develop new symptoms. SEEK IMMEDIATE MEDICAL CARE IF:  You have a fever that does not go away with treatment.  You develop chills.  You have extreme weakness, fainting, or dehydration.  You have repeated vomiting.  You develop severe pain in your abdomen.  You pass bloody or tarry stool.   This information is not intended to replace advice given to you by your health care provider. Make sure you discuss any questions you have with your health care provider.   Document Released: 03/09/2004 Document Revised: 10/21/2014 Document Reviewed: 05/25/2014 Elsevier Interactive Patient Education [employer]

## 2015-06-25 NOTE — ED Provider Notes (Signed)
Medical screening examination/treatment/procedure(s) were conducted as a shared visit with non-physician practitioner(s) and myself.  I personally evaluated the patient during the encounter.   EKG Interpretation None     70 year old female with abdominal pain and diarrhea. Symptom onset several days ago. Crampy abdominal pain. Center around the umbilicus also until her abdomen. Doesn't particularly lateralize. She was diagnosed with diverticulitis by her PCP on Tuesday has been taking ciprofloxacin for this. She additionally finished a course of doxycycline for a breast wound last week. Today she began having bright red blood mixed in with her diarrhea. Subjective fever. No nausea or vomiting. No urinary complaints. No recent hospitalizations.  Clinically suspect that this may be diverticulitis. She does have some mild to moderate tenderness across her lower abdomen. Could potentially explain the bloody diarrhea as well. She has been on antibiotics for only a couple days and cipro doesn't have great anerobic coverage. Less likely bacterial infectious diarrhea. Really no risk factors for cdiff aside from recent abx use. Cipro would cover for many forms of infectious diarrhea which may cause bleeding.   IVF. Labs. Will CT. Symptomatic tx.   Raeford Razor, MD 06/27/15 [phone]

## 2015-06-25 NOTE — ED Notes (Signed)
PA at bedside.

## 2015-06-25 NOTE — ED Notes (Signed)
Pt BIB EMS from home c/o lower abdominal pain. States she passed some bright red diarrhea this morning. Was dx with diverticulitis on Tuesday and has been taking cipro. Denies vomiting/ nausea/ fever.

## 2015-06-25 NOTE — ED Notes (Signed)
Pt provided with specimen cup to bring a stool sample back when she can provide one.

## 2015-06-25 NOTE — ED Notes (Signed)
Unable to obtain labs from IV start. Phlebotomy at bedside

## 2015-06-25 NOTE — ED Notes (Signed)
RN attempting labs while starting IV.

## 2015-06-25 NOTE — ED Notes (Signed)
Notified Dr Juleen China that pt is requesting pain medicine

## 2015-06-25 NOTE — ED Notes (Signed)
Pt reminded of need for urine 

## 2015-06-26 LAB — URINE CULTURE: Culture: NO GROWTH

## 2015-06-29 ENCOUNTER — Ambulatory Visit (INDEPENDENT_AMBULATORY_CARE_PROVIDER_SITE_OTHER): Payer: Medicare Other | Admitting: Gastroenterology

## 2015-06-29 ENCOUNTER — Encounter: Payer: Self-pay | Admitting: Gastroenterology

## 2015-06-29 VITALS — BP 122/70 | HR 100 | Ht 63.75 in | Wt 230.0 lb

## 2015-06-29 DIAGNOSIS — R935 Abnormal findings on diagnostic imaging of other abdominal regions, including retroperitoneum: Secondary | ICD-10-CM | POA: Diagnosis not present

## 2015-06-29 DIAGNOSIS — K602 Anal fissure, unspecified: Secondary | ICD-10-CM | POA: Diagnosis not present

## 2015-06-29 DIAGNOSIS — K625 Hemorrhage of anus and rectum: Secondary | ICD-10-CM

## 2015-06-29 DIAGNOSIS — R197 Diarrhea, unspecified: Secondary | ICD-10-CM

## 2015-06-29 DIAGNOSIS — A09 Infectious gastroenteritis and colitis, unspecified: Secondary | ICD-10-CM

## 2015-06-29 MED ORDER — AMBULATORY NON FORMULARY MEDICATION: 0.1250 mg | Freq: Three times a day (TID) | Status: DC

## 2015-06-29 NOTE — Patient Instructions (Signed)
If you are age 69 or older, your body mass index should be between 23-30. Your Body mass index is 39.8 kg/(m^2). If this is out of the aforementioned range listed, please consider follow up with your Primary Care Provider.  If you are age 12 or younger, your body mass index should be between 19-25. Your Body mass index is 39.8 kg/(m^2). If this is out of the aformentioned range listed, please consider follow up with your Primary Care Provider.   We have sent the following medications to your pharmacy for you to pick up at your convenience: Nitro ointment  This was sent to [facility].  Thank you for coming to [facility]  Dr Adela Lank

## 2015-06-29 NOTE — Progress Notes (Signed)
HPI :  70 y/o female here for ER follow up, former patient of Dr. Juanda Chance, new to me.   About 9 days ago she developed abdominal cramps and diarrhea which started at the same time. She reports she had 4 days worth of symptoms of diarrhea, high frequency loose stools, and abdominal cramps, and was given some Ciprofloxacin per PCP for possible diverticulitis. She reported symptoms were persisting and did not improve despite Cipro, she had persistent discomfort and nocturnal stools. She developed blood in the stools about 5 days after symptoms onset, which was last Friday. She was seen in the ER and had a CT scan abdomen showing colitis of the hepatic flexure and transverse colon, and given Flagyl in addition to Ciprofloxacin. Since the flagyl was added she reports she is feeling much better, roughly 75% improvement. She has started to eat BRAT diet (was on liquids for a few days), and tolerating it. In the past 24 hours she has had 7 BMs or so. When at its worst she was having 3-4 BMs per hour or so. She reports she continues to have rectal bleeding. Initially the bleeding was bright red and mixed with brown stool, and now just on the toilet paper. She thinks she has had a fever ranging to 102 to 104 when symptoms first started but this has since resolved. The abdominal discomfort is improving. Having a bowel movement will resolve the cramping. . She has some pressure in her anal canal but no pain. She was told she had an anal fissure on DRE per PCP.  A week prior to onset of symptoms she was on doxycycline for a cellulitis on left breast. At present time she is only taking Flagyl and stopped Cipro, she has 3 days left of this. She has submitted a stool sample for C Diff, and this is pending, this was given this morning.   She has never had anything like this before. No FH of colon cancer. No FH of Cronhs or colitis. She does not take NSAIDs, she had renal disease due to NSAIDs.  Colonoscopy 12/2012 -  hemorrhoids, diverticulosis, no polyps    Past Medical History  Diagnosis Date  . Thyroid disease   . Hypercholesterolemia   . Arthritis   . GERD (gastroesophageal reflux disease)   . Diverticulitis   . Sleep apnea   . Renal insufficiency     stage 3 kidney disease     Past Surgical History  Procedure Laterality Date  . Partial hysterectomy  1978    vaginal  . Tonsillectomy  1967  . Appendectomy  1957   Family History  Problem Relation Age of Onset  . Colon cancer Neg Hx   . Pancreatic cancer Neg Hx   . Stomach cancer Neg Hx    Social History  Substance Use Topics  . Smoking status: Never Smoker   . Smokeless tobacco: Never Used  . Alcohol Use: No   Current Outpatient Prescriptions  Medication Sig Dispense Refill  . ALPRAZolam (XANAX) 0.25 MG tablet Take 0.25 mg by mouth daily.    Marland Kitchen aspirin 81 MG tablet Take 81 mg by mouth daily.    . clobetasol cream (TEMOVATE) 0.05 % Apply 1 application topically 2 (two) times daily as needed (for rash, itching, irritation on hands).     . fluticasone (VERAMYST) 27.5 MCG/SPRAY nasal spray Place 2 sprays into the nose daily as needed for rhinitis or allergies.     Marland Kitchen levothyroxine (SYNTHROID, LEVOTHROID) 100 MCG tablet  Take 100 mcg by mouth daily before breakfast.    . metroNIDAZOLE (FLAGYL) 500 MG tablet Take 1 tablet (500 mg total) by mouth 3 (three) times daily. 21 tablet 0  . pantoprazole (PROTONIX) 40 MG tablet Take 40 mg by mouth daily.    . simvastatin (ZOCOR) 20 MG tablet Take 20 mg by mouth every evening.    Marland Kitchen VITAMIN D, CHOLECALCIFEROL, PO Take 5,000 Units by mouth.    Marland Kitchen HYDROcodone-acetaminophen (NORCO/VICODIN) 5-325 MG tablet Take 1-2 tablets by mouth every 4 (four) hours as needed. (Patient not taking: Reported on 06/29/2015) 15 tablet 0   No current facility-administered medications for this visit.   Allergies  Allergen Reactions  . Codeine Nausea And Vomiting    Pt is unable to recall all reactions to codeine  .  Meloxicam Other (See Comments)    Due to stage III kidney disease  . Penicillins     Has patient had a PCN reaction causing immediate rash, facial/tongue/throat swelling, SOB or lightheadedness with hypotension: unknown Has patient had a PCN reaction causing severe rash involving mucus membranes or skin necrosis: unknown Has patient had a PCN reaction that required hospitalization: yes, drs visit Has patient had a PCN reaction occurring within the last 10 years: yes If all of the above answers are "NO", then may proceed with Cephalosporin use.   Marland Kitchen Propoxyphene Hcl Nausea And Vomiting  . Tramadol Nausea Only    jittery     Review of Systems: All systems reviewed and negative except where noted in HPI.    Ct Abdomen Pelvis W Contrast  06/25/2015  CLINICAL DATA:  Bright red diarrhea this morning. Lower abdominal pain. EXAM: CT ABDOMEN AND PELVIS WITH CONTRAST TECHNIQUE: Multidetector CT imaging of the abdomen and pelvis was performed using the standard protocol following bolus administration of intravenous contrast. CONTRAST:  ISOVUE-300 IOPAMIDOL (ISOVUE-300) INJECTION 61% COMPARISON:  10/18/2010 FINDINGS: Lower chest: Stable 5 mm nodule in the right middle lobe on sequence 4, image 10 appears to have a few calcifications and probably represents a benign granuloma. Stable punctate nodular density near the right minor fissure on sequence 4, image 4. Small amount atelectasis or scarring at the right lung base. Hepatobiliary: Normal appearance of the liver, gallbladder and portal venous system. Pancreas: Fatty changes throughout the pancreas without evidence for inflammation or duct dilatation. Spleen: Normal appearance of spleen without enlargement Adrenals/Urinary Tract: Normal bilateral adrenal glands. There are bilateral peripelvic cysts without hydronephrosis. Normal urinary bladder. There is probably a punctate nonobstructive right kidney stone in lower pole. Stomach/Bowel: There is  extensive colon wall thickening, predominantly involving the transverse colon and hepatic flexure. There is small amount of pericolonic edema near the hepatic flexure. There is extensive diverticulosis involving the descending colon and the sigmoid colon. Difficult to exclude mild inflammation in the descending colon. There is oral contrast in the small bowel and right colon. No evidence for a small bowel obstruction. Normal appearance of the stomach and duodenum. Evidence for vascular engorgement in the omentum. Vascular/Lymphatic: Normal caliber of the abdominal aorta without significant atherosclerotic disease. Few scattered periaortic lymph nodes have not significantly changed. There are prominent lymph nodes along the lymphatic drainage of the sigmoid colon on sequence 2, images 66 and 63. There are enlarged lymph nodes near the left iliac chain which could also be along the lymphatic drainage of the left colon. These lymph nodes are best seen on sequence 2, image 59 and 60. Largest node measures 0.9 cm in the  short axis. Reproductive: Uterus has been removed. Evidence for bilateral ovarian tissue without gross abnormality. Other: Trace amount of free fluid in the pelvis.  No free air. Musculoskeletal: Stable sclerotic density in the right ilium is suggestive for a bone island. No suspicious bone findings. IMPRESSION: Diffuse colonic wall thickening which is most prominent in the transverse colon and hepatic flexure. Findings are suggestive for colitis. This could be an infectious or inflammatory process. There is a small amount of fluid in the pelvis. Extensive diverticulosis involving the sigmoid colon and left colon but this does not appear to be the source for the colonic inflammation. Prominent lymph nodes along the lymphatic distribution of the sigmoid colon could be reactive. These nodes have enlarged since 2012 and consider a 3-6 month follow-up for surveillance. Bilateral renal cysts without  hydronephrosis. Electronically Signed   By: Richarda Overlie M.D.   On: 06/25/2015 10:41    Physical Exam: BP 122/70 mmHg  Pulse 100  Ht 5' 3.75" (1.619 m)  Wt 230 lb (104.327 kg)  BMI 39.80 kg/m2  Repeat pulse 85-90 , obtained later in clinic visit Constitutional: Pleasant,well-developed, female in no acute distress. HEENT: Normocephalic and atraumatic. Conjunctivae are normal. No scleral icterus. Neck supple.  Cardiovascular: Normal rate, regular rhythm.  Pulmonary/chest: Effort normal and breath sounds normal. No wheezing, rales or rhonchi. Abdominal: Soft, nondistended, mild mid abdominal TTP without rebound or guarding. Bowel sounds active throughout. There are no masses palpable. No hepatomegaly. DRE - anal fissure posterior midline canal, external hemorrhoids, no mass Extremities: no edema Lymphadenopathy: No cervical adenopathy noted. Neurological: Alert and oriented to person place and time. Skin: Skin is warm and dry. No rashes noted. Psychiatric: Normal mood and affect. Behavior is normal.   ASSESSMENT AND PLAN: 70 y/o female who developed new onset high frequency stools, abdominal pain, fevers, followed by rectal bleeding after 5 days of symptoms onset. CT scan shows colitis as outlined above. DDx includes most likely infectious colitis vs. less likely IBD. I think ischemic colitis is much less likely given her description and duration of symptoms. She has an anal fissure noted on DRE and may be the likely source of her rectal bleeding in the setting of diarrhea. Overall, now on Flagyl and is "75%" improved from previous.   She had a GI pathogen panel which is pending, and just submitted C diff sample this AM, although this could be falsely negative in the setting of a few days of Flagyl. At this time she can advance her diet as tolerated. I will give her nitroglycerin ointment to apply TID and she should stop steroid suppository. If C Diff is negative, and pending GI pathogen panel,  she can use some immodium PRN. We will await these results. Otherwise, we will touch base with her in a few days for reassessment. If she worsens once she completes Flagyl or fails to resolve this with persistent symptoms, I offered her a colonoscopy to ensure no evidence of IBD. We will consider a colonoscopy in a few months regardless given her CT findings.    All questions answered, she agreed with the plan.   Ileene Patrick, MD [facility] Pager [phone]  CC: Laurann Montana, MD

## 2015-07-02 ENCOUNTER — Telehealth: Payer: Self-pay | Admitting: Gastroenterology

## 2015-07-02 ENCOUNTER — Encounter: Payer: Self-pay | Admitting: *Deleted

## 2015-07-02 DIAGNOSIS — R197 Diarrhea, unspecified: Secondary | ICD-10-CM

## 2015-07-02 NOTE — Telephone Encounter (Signed)
Patient states she continues to have diarrhea. Report 5 stools today already. She has completed the Flagyl. She is on the [employer]. I do not see that the GI pathogen panel was ever collected. Patient states she took a sample in for c. Diff. Please, advise if she needs to come to our lab for stool pathogen studies and what she can try now.

## 2015-07-02 NOTE — Telephone Encounter (Signed)
Jamie Wood, I think you are right, it appears the GI pathogen panel was never collected. She had C diff testing done by her primary care the other day, can you touch base with them to see if that was resulted? If that is negative she can use some immodium to help slow her stools. Otherwise when she saw me she said she was "75%" better on the flagyl, is that still the case or has she worsened since I have seen her? I can add her for a colonoscopy on Monday morning if her symptoms are persistent despite multiple courses of antibiotics to ensure no evidence of IBD, but would like her C diff back prior to doing it, as if she is C diff positive I would treat her with oral vancomycin. Can you please look into this and touch base with me? Thanks

## 2015-07-02 NOTE — Telephone Encounter (Signed)
Thanks very much.

## 2015-07-02 NOTE — Telephone Encounter (Signed)
Spoke with patient and Dr. Laurann Montana is her PCP that did the C. Diff test. Left a message with medical records to call me back.

## 2015-07-02 NOTE — Telephone Encounter (Signed)
Received a call from Dr. Lucilla Lame office that c. Diff is negative. Patient notified and she will take Imodium. Scheduled prop colonoscopy on 07/06/15 at 10:30 AM. Patient will come Monday at 10:00 AM to sign consent and go over instructions.

## 2015-07-05 ENCOUNTER — Telehealth: Payer: Self-pay | Admitting: Gastroenterology

## 2015-07-05 MED ORDER — NA SULFATE-K SULFATE-MG SULF 17.5-3.13-1.6 GM/177ML PO SOLN: ORAL | Status: DC

## 2015-07-05 NOTE — Telephone Encounter (Signed)
Rx sent to [facility]

## 2015-07-06 ENCOUNTER — Telehealth: Payer: Self-pay | Admitting: Gastroenterology

## 2015-07-06 ENCOUNTER — Ambulatory Visit (AMBULATORY_SURGERY_CENTER): Payer: Medicare Other | Admitting: Gastroenterology

## 2015-07-06 ENCOUNTER — Encounter: Payer: Self-pay | Admitting: Gastroenterology

## 2015-07-06 ENCOUNTER — Other Ambulatory Visit: Payer: Self-pay

## 2015-07-06 ENCOUNTER — Other Ambulatory Visit: Payer: Self-pay | Admitting: *Deleted

## 2015-07-06 ENCOUNTER — Other Ambulatory Visit (INDEPENDENT_AMBULATORY_CARE_PROVIDER_SITE_OTHER): Payer: Medicare Other

## 2015-07-06 VITALS — BP 102/60 | HR 85 | Temp 95.3°F | Resp 24 | Ht 63.75 in | Wt 230.0 lb

## 2015-07-06 DIAGNOSIS — R935 Abnormal findings on diagnostic imaging of other abdominal regions, including retroperitoneum: Secondary | ICD-10-CM

## 2015-07-06 DIAGNOSIS — K51319 Ulcerative (chronic) rectosigmoiditis with unspecified complications: Secondary | ICD-10-CM

## 2015-07-06 DIAGNOSIS — R197 Diarrhea, unspecified: Secondary | ICD-10-CM

## 2015-07-06 DIAGNOSIS — K635 Polyp of colon: Secondary | ICD-10-CM

## 2015-07-06 LAB — CBC WITH DIFFERENTIAL/PLATELET
BASOS ABS: 0 10*3/uL (ref 0.0–0.1)
Basophils Relative: 0.2 % (ref 0.0–3.0)
EOS PCT: 0.3 % (ref 0.0–5.0)
Eosinophils Absolute: 0 10*3/uL (ref 0.0–0.7)
HCT: 34.6 % — ABNORMAL LOW (ref 36.0–46.0)
HEMOGLOBIN: 11.6 g/dL — AB (ref 12.0–15.0)
LYMPHS ABS: 0.6 10*3/uL — AB (ref 0.7–4.0)
Lymphocytes Relative: 6.1 % — ABNORMAL LOW (ref 12.0–46.0)
MCHC: 33.4 g/dL (ref 30.0–36.0)
MCV: 89.8 fl (ref 78.0–100.0)
MONO ABS: 1.4 10*3/uL — AB (ref 0.1–1.0)
MONOS PCT: 13.4 % — AB (ref 3.0–12.0)
NEUTROS PCT: 80 % — AB (ref 43.0–77.0)
Neutro Abs: 8.2 10*3/uL — ABNORMAL HIGH (ref 1.4–7.7)
Platelets: 460 10*3/uL — ABNORMAL HIGH (ref 150.0–400.0)
RBC: 3.85 Mil/uL — AB (ref 3.87–5.11)
RDW: 14.9 % (ref 11.5–15.5)
WBC: 10.3 10*3/uL (ref 4.0–10.5)

## 2015-07-06 LAB — COMPREHENSIVE METABOLIC PANEL
ALBUMIN: 2.3 g/dL — AB (ref 3.5–5.2)
ALK PHOS: 46 U/L (ref 39–117)
ALT: 12 U/L (ref 0–35)
AST: 26 U/L (ref 0–37)
BILIRUBIN TOTAL: 0.4 mg/dL (ref 0.2–1.2)
BUN: 21 mg/dL (ref 6–23)
CO2: 30 mEq/L (ref 19–32)
Calcium: 7.3 mg/dL — ABNORMAL LOW (ref 8.4–10.5)
Chloride: 93 mEq/L — ABNORMAL LOW (ref 96–112)
Creatinine, Ser: 1.51 mg/dL — ABNORMAL HIGH (ref 0.40–1.20)
GFR: 36.32 mL/min — ABNORMAL LOW (ref >60.00)
GLUCOSE: 147 mg/dL — AB (ref 70–99)
Potassium: 2.9 mEq/L — ABNORMAL LOW (ref 3.5–5.1)
SODIUM: 135 meq/L (ref 135–145)
TOTAL PROTEIN: 5.1 g/dL — AB (ref 6.0–8.3)

## 2015-07-06 LAB — SEDIMENTATION RATE: SED RATE: 68 mm/h — AB (ref 0–30)

## 2015-07-06 LAB — C-REACTIVE PROTEIN: CRP: 26.2 mg/dL — ABNORMAL HIGH (ref 0.5–20.0)

## 2015-07-06 MED ORDER — MESALAMINE 4 G RE ENEM: 4.0000 g | ENEMA | Freq: Every day | RECTAL | Status: DC

## 2015-07-06 MED ORDER — MESALAMINE 1.2 G PO TBEC: 4.8000 g | DELAYED_RELEASE_TABLET | Freq: Every day | ORAL | Status: DC

## 2015-07-06 MED ORDER — SODIUM CHLORIDE 0.9 % IV SOLN: 500.0000 mL | INTRAVENOUS | Status: DC

## 2015-07-06 NOTE — Telephone Encounter (Signed)
Called pt and explained will resend prescription into [facility] at [facility].

## 2015-07-06 NOTE — Op Note (Signed)
[facility] Patient Name: Nevena Rozenberg Procedure Date: 07/06/2015 10:09 AM MRN: [id number] Endoscopist: Remo Lipps P. Havery Moros , MD Age: 70 Referring MD:  Date of Birth: 1945-04-03 Gender: Female Procedure:                Colonoscopy Indications:              Clinically significant diarrhea of unexplained                            origin, prior abnormal CT scan with inflammation of                            hepatic flexure and transverse colon. Initially                            improved significantly on antibiotics, however                            symptoms have since recurred. C diff PCR is negative Medicines:                Monitored Anesthesia Care Procedure:                Pre-Anesthesia Assessment:                           - Prior to the procedure, a History and Physical                            was performed, and patient medications and                            allergies were reviewed. The patient's tolerance of                            previous anesthesia was also reviewed. The risks                            and benefits of the procedure and the sedation                            options and risks were discussed with the patient.                            All questions were answered, and informed consent                            was obtained. Prior Anticoagulants: The patient has                            taken aspirin, last dose was 1 day prior to                            procedure. ASA Grade Assessment: II - A patient  with mild systemic disease. After reviewing the                            risks and benefits, the patient was deemed in                            satisfactory condition to undergo the procedure.                           After obtaining informed consent, the colonoscope                            was passed under direct vision. Throughout the                            procedure, the patient's blood  pressure, pulse, and                            oxygen saturations were monitored continuously. The                            Model PCF-H190DL [id number]) scope was introduced                            through the anus with the intention of advancing to                            the cecum. The scope was advanced to the sigmoid                            colon before the procedure was aborted. Medications                            were given. The colonoscopy was performed without                            difficulty. The patient tolerated the procedure                            well. The quality of the bowel preparation was                            adequate. The rectum was photographed. Scope In: Scope Out: Findings:                 The perianal exam findings include anal fissure and                            external hemorrhoids.                           Diffuse moderate inflammation characterized by                            altered vascularity,  congestion (edema) and                            erythema was found in the rectum, in the                            recto-sigmoid colon and in the sigmoid colon. The                            inflammation appeared superficial however, no                            erosions or ulcerations appreciated. Biopsies were                            taken with a cold forceps for histology. Due to the                            edema in the sigmoid colon, in conjunction with                            diverticular disease, the lumen was not well                            visualized and I did not traverse this area and the                            endoscope was withdrawn. Retroflexion was not                            performed given the inflammatory changes noted.                           A 15 mm polypoid lesion was found in the sigmoid                            colon. The polyp was sessile. Unclear if this was                             an inflammatory polyp versus adenomatous lesion.                            Given the inflammation noted, it was not removed.                            Biopsies were taken with a cold forceps for                            histology.                           A few medium-mouthed diverticula were found in the  sigmoid colon.                           Non-bleeding internal hemorrhoids were found. The                            hemorrhoids were moderate. Complications:            No immediate complications. Estimated blood loss:                            Minimal. Estimated Blood Loss:     Estimated blood loss was minimal. Impression:               - Anal fissure and non-thrombosed external                            hemorrhoids found on perianal exam.                           - Diffuse moderate inflammation was found in the                            rectum, in the recto-sigmoid colon and in the                            sigmoid colon secondary to colitis. Biopsied.                           - One polypoid lesion in the sigmoid colon.                            Biopsied.                           - Diverticulosis in the sigmoid colon.                           - Non-bleeding internal hemorrhoids.                           Overall, findings could certainly represent                            ulcerative colitis, however inflammation appeared                            more superficial than usually seen with IBD, and                            given her prior history infectious is possible but                            seems less likely at this time given the time                            course. Recommendation:           -  Patient has a contact number available for                            emergencies. The signs and symptoms of potential                            delayed complications were discussed with the                            patient. Return to  normal activities tomorrow.                            Written discharge instructions were provided to the                            patient.                           - Resume previous diet.                           - Continue present medications.                           - No aspirin, ibuprofen, naproxen, or other                            non-steroidal anti-inflammatory drugs                           - Please go to the lab to submit GI pathopen panel                            as previously ordered.                           - Please go to the lab for CBC, CMP, ESR, and CRP                            to be drawn                           - Await pathology results.                           - Start Lialda 4.8gm daily + Rowasa enemas qHS. I                            will also discuss starting steroids with the                            patient pending the severity of her symptoms.                           - Repeat colonoscopy in thwe future to evaluate the  response to therapy and pending course Carlota Raspberry. Jayln Branscom, MD 07/06/2015 10:47:30 AM This report has been signed electronically.

## 2015-07-06 NOTE — Progress Notes (Signed)
Called to room to assist during endoscopic procedure.  Patient ID and intended procedure confirmed with present staff. Received instructions for my participation in the procedure from the performing physician.  

## 2015-07-06 NOTE — Progress Notes (Signed)
Patient awakening,vss,report to rn 

## 2015-07-06 NOTE — Patient Instructions (Signed)
Discharge instructions given. Prescriptions sent to pharmacy. Sent to lab after discharge. Resume previous medications. YOU HAD AN ENDOSCOPIC PROCEDURE TODAY AT THE [facility]:   Refer to the procedure report that was given to you for any specific questions about what was found during the examination.  If the procedure report does not answer your questions, please call your gastroenterologist to clarify.  If you requested that your care partner not be given the details of your procedure findings, then the procedure report has been included in a sealed envelope for you to review at your convenience later.  YOU SHOULD EXPECT: Some feelings of bloating in the abdomen. Passage of more gas than usual.  Walking can help get rid of the air that was put into your GI tract during the procedure and reduce the bloating. If you had a lower endoscopy (such as a colonoscopy or flexible sigmoidoscopy) you may notice spotting of blood in your stool or on the toilet paper. If you underwent a bowel prep for your procedure, you may not have a normal bowel movement for a few days.  Please Note:  You might notice some irritation and congestion in your nose or some drainage.  This is from the oxygen used during your procedure.  There is no need for concern and it should clear up in a day or so.  SYMPTOMS TO REPORT IMMEDIATELY:   Following lower endoscopy (colonoscopy or flexible sigmoidoscopy):  Excessive amounts of blood in the stool  Significant tenderness or worsening of abdominal pains  Swelling of the abdomen that is new, acute  Fever of 100F or higher   For urgent or emergent issues, a gastroenterologist can be reached at any hour by calling [phone].   DIET: Your first meal following the procedure should be a small meal and then it is ok to progress to your normal diet. Heavy or fried foods are harder to digest and may make you feel nauseous or bloated.  Likewise, meals heavy in dairy  and vegetables can increase bloating.  Drink plenty of fluids but you should avoid alcoholic beverages for 24 hours.  ACTIVITY:  You should plan to take it easy for the rest of today and you should NOT DRIVE or use heavy machinery until tomorrow (because of the sedation medicines used during the test).    FOLLOW UP: Our staff will call the number listed on your records the next business day following your procedure to check on you and address any questions or concerns that you may have regarding the information given to you following your procedure. If we do not reach you, we will leave a message.  However, if you are feeling well and you are not experiencing any problems, there is no need to return our call.  We will assume that you have returned to your regular daily activities without incident.  If any biopsies were taken you will be contacted by phone or by letter within the next 1-3 weeks.  Please call us at [phone] if you have not heard about the biopsies in 3 weeks.    SIGNATURES/CONFIDENTIALITY: You and/or your care partner have signed paperwork which will be entered into your electronic medical record.  These signatures attest to the fact that that the information above on your After Visit Summary has been reviewed and is understood.  Full responsibility of the confidentiality of this discharge information lies with you and/or your care-partner.

## 2015-07-07 ENCOUNTER — Other Ambulatory Visit: Payer: Self-pay

## 2015-07-07 ENCOUNTER — Telehealth: Payer: Self-pay | Admitting: *Deleted

## 2015-07-07 ENCOUNTER — Ambulatory Visit (HOSPITAL_COMMUNITY)
Admission: RE | Admit: 2015-07-07 | Discharge: 2015-07-07 | Disposition: A | Discharge status: Home / Self Care | Payer: Medicare Other | Source: Ambulatory Visit | Attending: Family Medicine | Admitting: Family Medicine

## 2015-07-07 DIAGNOSIS — E860 Dehydration: Secondary | ICD-10-CM

## 2015-07-07 MED ORDER — SODIUM CHLORIDE 0.9 % IV SOLN
Freq: Once | INTRAVENOUS | Status: AC
  Administered 2015-07-07: 10:00:00 via INTRAVENOUS

## 2015-07-07 MED ORDER — PREDNISONE 10 MG PO TABS: ORAL_TABLET | ORAL | Status: DC

## 2015-07-07 MED ORDER — POTASSIUM CHLORIDE CRYS ER 20 MEQ PO TBCR
40.0000 meq | EXTENDED_RELEASE_TABLET | Freq: Once | ORAL | Status: AC
  Administered 2015-07-07: 40 meq via ORAL
  Filled 2015-07-07: qty 2

## 2015-07-07 NOTE — Progress Notes (Signed)
Patient ID: Jamie Wood, female   DOB: 10-Nov-1945, 70 y.o.   MRN: [id number]   Medical Provider; Ruffin Frederick, MD  Associated Diagnosis: Dehydration  Procedure: Infusion of 1500 ml 0.9% Normal Saline via peripheral IV. PO potassium given as ordered.  Patient tolerated infusion well. No adverse reactions. Went over discharge instructions and copy given to patient. Alert, oriented and ambulatory at time of discharge. Discharged to home.

## 2015-07-07 NOTE — Telephone Encounter (Signed)
  Follow up Call-  Call back number 07/06/2015 12/18/2012  Post procedure Call Back phone  # #[phone] cell [phone]  Permission to leave phone message Yes Yes     Patient questions:  Do you have a fever, pain , or abdominal swelling? No. Pain Score  0 *  Have you tolerated food without any problems? Yes.    Have you been able to return to your normal activities? Yes.    Do you have any questions about your discharge instructions: Diet   No. Medications  No. Follow up visit  No.  Do you have questions or concerns about your Care? No.  Actions: * If pain score is 4 or above: No action needed, pain <4.

## 2015-07-07 NOTE — Discharge Instructions (Signed)
You received an IV infusion of normal saline today per your physician's orders. Please follow-up with your provider for any questions or concerns related to your diagnosis.  Dehydration, Adult Dehydration is a condition in which you do not have enough fluid or water in your body. It happens when you take in less fluid than you lose. Vital organs such as the kidneys, brain, and heart cannot function without a proper amount of fluids. Any loss of fluids from the body can cause dehydration.  Dehydration can range from mild to severe. This condition should be treated right away to help prevent it from becoming severe. CAUSES  This condition may be caused by:  Vomiting.  Diarrhea.  Excessive sweating, such as when exercising in hot or humid weather.  Not drinking enough fluid during strenuous exercise or during an illness.  Excessive urine output.  Fever.  Certain medicines. RISK FACTORS This condition is more likely to develop in:  People who are taking certain medicines that cause the body to lose excess fluid (diuretics).   People who have a chronic illness, such as diabetes, that may increase urination.  Older adults.   People who live at high altitudes.   People who participate in endurance sports.  SYMPTOMS  Mild Dehydration  Thirst.  Dry lips.  Slightly dry mouth.  Dry, warm skin. Moderate Dehydration  Very dry mouth.   Muscle cramps.   Dark urine and decreased urine production.   Decreased tear production.   Headache.   Light-headedness, especially when you stand up from a sitting position.  Severe Dehydration  Changes in skin.   Cold and clammy skin.   Skin does not spring back quickly when lightly pinched and released.   Changes in body fluids.   Extreme thirst.   No tears.   Not able to sweat when body temperature is high, such as in hot weather.   Minimal urine production.   Changes in vital signs.   Rapid, weak  pulse (more than 100 beats per minute when you are sitting still).   Rapid breathing.   Low blood pressure.   Other changes.   Sunken eyes.   Cold hands and feet.   Confusion.  Lethargy and difficulty being awakened.  Fainting (syncope).   Short-term weight loss.   Unconsciousness. DIAGNOSIS  This condition may be diagnosed based on your symptoms. You may also have tests to determine how severe your dehydration is. These tests may include:   Urine tests.   Blood tests.  TREATMENT  Treatment for this condition depends on the severity. Mild or moderate dehydration can often be treated at home. Treatment should be started right away. Do not wait until dehydration becomes severe. Severe dehydration needs to be treated at the hospital. Treatment for Mild Dehydration  Drinking plenty of water to replace the fluid you have lost.   Replacing minerals in your blood (electrolytes) that you may have lost.  Treatment for Moderate Dehydration  Consuming oral rehydration solution (ORS). Treatment for Severe Dehydration  Receiving fluid through an IV tube.   Receiving electrolyte solution through a feeding tube that is passed through your nose and into your stomach (nasogastric tube or NG tube).  Correcting any abnormalities in electrolytes. HOME CARE INSTRUCTIONS   Drink enough fluid to keep your urine clear or pale yellow.   Drink water or fluid slowly by taking small sips. You can also try sucking on ice cubes.  Have food or beverages that contain electrolytes. Examples include bananas  and sports drinks.  Take over-the-counter and prescription medicines only as told by your health care provider.   Prepare ORS according to the manufacturer's instructions. Take sips of ORS every 5 minutes until your urine returns to normal.  If you have vomiting or diarrhea, continue to try to drink water, ORS, or both.   If you have diarrhea, avoid:   Beverages that  contain caffeine.   Fruit juice.   Milk.   Carbonated soft drinks.  Do not take salt tablets. This can lead to the condition of having too much sodium in your body (hypernatremia).  SEEK MEDICAL CARE IF:  You cannot eat or drink without vomiting.  You have had moderate diarrhea during a period of more than 24 hours.  You have a fever. SEEK IMMEDIATE MEDICAL CARE IF:   You have extreme thirst.  You have severe diarrhea.  You have not urinated in 6-8 hours, or you have urinated only a small amount of very dark urine.  You have shriveled skin.  You are dizzy, confused, or both.   This information is not intended to replace advice given to you by your health care provider. Make sure you discuss any questions you have with your health care provider.   Document Released: 01/30/2005 Document Revised: 10/21/2014 Document Reviewed: 06/17/2014 Elsevier Interactive Patient Education [employer]

## 2015-07-09 ENCOUNTER — Telehealth: Payer: Self-pay | Admitting: *Deleted

## 2015-07-09 ENCOUNTER — Inpatient Hospital Stay (HOSPITAL_COMMUNITY)
Admission: EM | Admit: 2015-07-09 | Discharge: 2015-07-28 | DRG: 385 | Disposition: A | Discharge status: Skilled Nursing Facility | Payer: Medicare Other | Attending: Internal Medicine | Admitting: Internal Medicine

## 2015-07-09 ENCOUNTER — Encounter (HOSPITAL_COMMUNITY): Payer: Self-pay | Admitting: Emergency Medicine

## 2015-07-09 ENCOUNTER — Inpatient Hospital Stay (HOSPITAL_COMMUNITY): Payer: Medicare Other

## 2015-07-09 DIAGNOSIS — E871 Hypo-osmolality and hyponatremia: Secondary | ICD-10-CM | POA: Diagnosis present

## 2015-07-09 DIAGNOSIS — E559 Vitamin D deficiency, unspecified: Secondary | ICD-10-CM | POA: Diagnosis present

## 2015-07-09 DIAGNOSIS — E039 Hypothyroidism, unspecified: Secondary | ICD-10-CM | POA: Diagnosis present

## 2015-07-09 DIAGNOSIS — I82401 Acute embolism and thrombosis of unspecified deep veins of right lower extremity: Secondary | ICD-10-CM | POA: Diagnosis not present

## 2015-07-09 DIAGNOSIS — K5730 Diverticulosis of large intestine without perforation or abscess without bleeding: Secondary | ICD-10-CM | POA: Diagnosis present

## 2015-07-09 DIAGNOSIS — K922 Gastrointestinal hemorrhage, unspecified: Secondary | ICD-10-CM

## 2015-07-09 DIAGNOSIS — E43 Unspecified severe protein-calorie malnutrition: Secondary | ICD-10-CM | POA: Diagnosis present

## 2015-07-09 DIAGNOSIS — W1830XA Fall on same level, unspecified, initial encounter: Secondary | ICD-10-CM | POA: Diagnosis not present

## 2015-07-09 DIAGNOSIS — Y848 Other medical procedures as the cause of abnormal reaction of the patient, or of later complication, without mention of misadventure at the time of the procedure: Secondary | ICD-10-CM | POA: Diagnosis present

## 2015-07-09 DIAGNOSIS — I071 Rheumatic tricuspid insufficiency: Secondary | ICD-10-CM | POA: Diagnosis present

## 2015-07-09 DIAGNOSIS — D500 Iron deficiency anemia secondary to blood loss (chronic): Secondary | ICD-10-CM | POA: Diagnosis present

## 2015-07-09 DIAGNOSIS — E860 Dehydration: Secondary | ICD-10-CM | POA: Diagnosis present

## 2015-07-09 DIAGNOSIS — Z7982 Long term (current) use of aspirin: Secondary | ICD-10-CM

## 2015-07-09 DIAGNOSIS — Y838 Other surgical procedures as the cause of abnormal reaction of the patient, or of later complication, without mention of misadventure at the time of the procedure: Secondary | ICD-10-CM | POA: Diagnosis not present

## 2015-07-09 DIAGNOSIS — R5381 Other malaise: Secondary | ICD-10-CM | POA: Diagnosis not present

## 2015-07-09 DIAGNOSIS — E861 Hypovolemia: Secondary | ICD-10-CM | POA: Diagnosis present

## 2015-07-09 DIAGNOSIS — N183 Chronic kidney disease, stage 3 unspecified: Secondary | ICD-10-CM

## 2015-07-09 DIAGNOSIS — D72829 Elevated white blood cell count, unspecified: Secondary | ICD-10-CM | POA: Diagnosis present

## 2015-07-09 DIAGNOSIS — F411 Generalized anxiety disorder: Secondary | ICD-10-CM | POA: Diagnosis present

## 2015-07-09 DIAGNOSIS — Y92230 Patient room in hospital as the place of occurrence of the external cause: Secondary | ICD-10-CM | POA: Diagnosis not present

## 2015-07-09 DIAGNOSIS — K219 Gastro-esophageal reflux disease without esophagitis: Secondary | ICD-10-CM | POA: Diagnosis present

## 2015-07-09 DIAGNOSIS — E8770 Fluid overload, unspecified: Secondary | ICD-10-CM | POA: Diagnosis not present

## 2015-07-09 DIAGNOSIS — Z79899 Other long term (current) drug therapy: Secondary | ICD-10-CM

## 2015-07-09 DIAGNOSIS — I951 Orthostatic hypotension: Secondary | ICD-10-CM | POA: Diagnosis present

## 2015-07-09 DIAGNOSIS — K529 Noninfective gastroenteritis and colitis, unspecified: Secondary | ICD-10-CM | POA: Diagnosis not present

## 2015-07-09 DIAGNOSIS — R609 Edema, unspecified: Secondary | ICD-10-CM | POA: Diagnosis not present

## 2015-07-09 DIAGNOSIS — Z880 Allergy status to penicillin: Secondary | ICD-10-CM

## 2015-07-09 DIAGNOSIS — D649 Anemia, unspecified: Secondary | ICD-10-CM | POA: Diagnosis not present

## 2015-07-09 DIAGNOSIS — K5010 Crohn's disease of large intestine without complications: Secondary | ICD-10-CM | POA: Diagnosis present

## 2015-07-09 DIAGNOSIS — I129 Hypertensive chronic kidney disease with stage 1 through stage 4 chronic kidney disease, or unspecified chronic kidney disease: Secondary | ICD-10-CM | POA: Diagnosis present

## 2015-07-09 DIAGNOSIS — Z886 Allergy status to analgesic agent status: Secondary | ICD-10-CM

## 2015-07-09 DIAGNOSIS — Z885 Allergy status to narcotic agent status: Secondary | ICD-10-CM

## 2015-07-09 DIAGNOSIS — K50119 Crohn's disease of large intestine with unspecified complications: Secondary | ICD-10-CM | POA: Diagnosis not present

## 2015-07-09 DIAGNOSIS — R109 Unspecified abdominal pain: Secondary | ICD-10-CM | POA: Diagnosis present

## 2015-07-09 DIAGNOSIS — M810 Age-related osteoporosis without current pathological fracture: Secondary | ICD-10-CM | POA: Diagnosis present

## 2015-07-09 DIAGNOSIS — G4730 Sleep apnea, unspecified: Secondary | ICD-10-CM | POA: Diagnosis present

## 2015-07-09 DIAGNOSIS — E669 Obesity, unspecified: Secondary | ICD-10-CM | POA: Diagnosis present

## 2015-07-09 DIAGNOSIS — R600 Localized edema: Secondary | ICD-10-CM | POA: Diagnosis not present

## 2015-07-09 DIAGNOSIS — Z7952 Long term (current) use of systemic steroids: Secondary | ICD-10-CM

## 2015-07-09 DIAGNOSIS — T82868A Thrombosis of vascular prosthetic devices, implants and grafts, initial encounter: Secondary | ICD-10-CM | POA: Diagnosis not present

## 2015-07-09 DIAGNOSIS — E785 Hyperlipidemia, unspecified: Secondary | ICD-10-CM | POA: Diagnosis present

## 2015-07-09 DIAGNOSIS — Z888 Allergy status to other drugs, medicaments and biological substances status: Secondary | ICD-10-CM

## 2015-07-09 DIAGNOSIS — I82A11 Acute embolism and thrombosis of right axillary vein: Secondary | ICD-10-CM | POA: Diagnosis not present

## 2015-07-09 DIAGNOSIS — Z6841 Body Mass Index (BMI) 40.0 and over, adult: Secondary | ICD-10-CM

## 2015-07-09 DIAGNOSIS — K51911 Ulcerative colitis, unspecified with rectal bleeding: Secondary | ICD-10-CM | POA: Diagnosis not present

## 2015-07-09 DIAGNOSIS — K50111 Crohn's disease of large intestine with rectal bleeding: Secondary | ICD-10-CM | POA: Diagnosis not present

## 2015-07-09 DIAGNOSIS — T380X5A Adverse effect of glucocorticoids and synthetic analogues, initial encounter: Secondary | ICD-10-CM | POA: Diagnosis present

## 2015-07-09 DIAGNOSIS — E876 Hypokalemia: Secondary | ICD-10-CM | POA: Diagnosis not present

## 2015-07-09 DIAGNOSIS — R197 Diarrhea, unspecified: Secondary | ICD-10-CM | POA: Diagnosis present

## 2015-07-09 DIAGNOSIS — M25561 Pain in right knee: Secondary | ICD-10-CM | POA: Diagnosis not present

## 2015-07-09 DIAGNOSIS — K5090 Crohn's disease, unspecified, without complications: Secondary | ICD-10-CM

## 2015-07-09 DIAGNOSIS — N179 Acute kidney failure, unspecified: Secondary | ICD-10-CM | POA: Diagnosis present

## 2015-07-09 DIAGNOSIS — K51011 Ulcerative (chronic) pancolitis with rectal bleeding: Secondary | ICD-10-CM | POA: Diagnosis present

## 2015-07-09 DIAGNOSIS — Z9049 Acquired absence of other specified parts of digestive tract: Secondary | ICD-10-CM

## 2015-07-09 DIAGNOSIS — D62 Acute posthemorrhagic anemia: Secondary | ICD-10-CM | POA: Diagnosis present

## 2015-07-09 DIAGNOSIS — Z7960 Long term (current) use of unspecified immunomodulators and immunosuppressants: Secondary | ICD-10-CM

## 2015-07-09 DIAGNOSIS — M25562 Pain in left knee: Secondary | ICD-10-CM | POA: Diagnosis not present

## 2015-07-09 HISTORY — DX: Crohn's disease of large intestine without complications: K50.10

## 2015-07-09 LAB — CBC
HCT: 27.2 % — ABNORMAL LOW (ref 36.0–46.0)
HCT: 21.2 % — ABNORMAL LOW (ref 36.0–46.0)
HEMOGLOBIN: 9.4 g/dL — AB (ref 12.0–15.0)
Hemoglobin: 7.1 g/dL — ABNORMAL LOW (ref 12.0–15.0)
MCH: 30.8 pg (ref 26.0–34.0)
MCH: 30.2 pg (ref 26.0–34.0)
MCHC: 34.6 g/dL (ref 30.0–36.0)
MCHC: 33.5 g/dL (ref 30.0–36.0)
MCV: 89.2 fL (ref 78.0–100.0)
MCV: 90.2 fL (ref 78.0–100.0)
PLATELETS: 426 10*3/uL — AB (ref 150–400)
Platelets: 572 10*3/uL — ABNORMAL HIGH (ref 150–400)
RBC: 3.05 MIL/uL — AB (ref 3.87–5.11)
RBC: 2.35 MIL/uL — AB (ref 3.87–5.11)
RDW: 15.2 % (ref 11.5–15.5)
RDW: 15.3 % (ref 11.5–15.5)
WBC: 18.4 10*3/uL — AB (ref 4.0–10.5)
WBC: 16.7 10*3/uL — AB (ref 4.0–10.5)

## 2015-07-09 LAB — COMPREHENSIVE METABOLIC PANEL
ALK PHOS: 58 U/L (ref 38–126)
ALT: 22 U/L (ref 14–54)
ANION GAP: 9 (ref 5–15)
AST: 41 U/L (ref 15–41)
Albumin: 1.9 g/dL — ABNORMAL LOW (ref 3.5–5.0)
BILIRUBIN TOTAL: 1.4 mg/dL — AB (ref 0.3–1.2)
BUN: 22 mg/dL — ABNORMAL HIGH (ref 6–20)
CALCIUM: 7.4 mg/dL — AB (ref 8.9–10.3)
CO2: 27 mmol/L (ref 22–32)
Chloride: 97 mmol/L — ABNORMAL LOW (ref 101–111)
Creatinine, Ser: 1.54 mg/dL — ABNORMAL HIGH (ref 0.44–1.00)
GFR calc non Af Amer: 34 mL/min — ABNORMAL LOW (ref >60)
GFR, EST AFRICAN AMERICAN: 39 mL/min — AB (ref >60)
Glucose, Bld: 206 mg/dL — ABNORMAL HIGH (ref 65–99)
Potassium: 3.5 mmol/L (ref 3.5–5.1)
Sodium: 133 mmol/L — ABNORMAL LOW (ref 135–145)
TOTAL PROTEIN: 5.5 g/dL — AB (ref 6.5–8.1)

## 2015-07-09 LAB — ABO/RH: ABO/RH(D): A POS

## 2015-07-09 LAB — LIPASE, BLOOD: Lipase: 34 U/L (ref 11–51)

## 2015-07-09 LAB — PREPARE RBC (CROSSMATCH)

## 2015-07-09 LAB — MRSA PCR SCREENING: MRSA BY PCR: NEGATIVE

## 2015-07-09 MED ORDER — DIPHENOXYLATE-ATROPINE 2.5-0.025 MG PO TABS
2.0000 | ORAL_TABLET | Freq: Once | ORAL | Status: AC
  Administered 2015-07-09: 2 via ORAL
  Filled 2015-07-09: qty 2

## 2015-07-09 MED ORDER — POTASSIUM CHLORIDE CRYS ER 20 MEQ PO TBCR
20.0000 meq | EXTENDED_RELEASE_TABLET | Freq: Three times a day (TID) | ORAL | Status: DC
  Administered 2015-07-09 – 2015-07-10 (×4): 20 meq via ORAL
  Filled 2015-07-09 (×4): qty 1

## 2015-07-09 MED ORDER — SODIUM CHLORIDE 0.9% FLUSH: 3.0000 mL | INTRAVENOUS | Status: DC | PRN

## 2015-07-09 MED ORDER — ONDANSETRON HCL 4 MG/2ML IJ SOLN
4.0000 mg | Freq: Four times a day (QID) | INTRAMUSCULAR | Status: DC | PRN
  Filled 2015-07-09 (×3): qty 2

## 2015-07-09 MED ORDER — SODIUM CHLORIDE 0.9% FLUSH: 3.0000 mL | Freq: Two times a day (BID) | INTRAVENOUS | Status: DC

## 2015-07-09 MED ORDER — SODIUM CHLORIDE 0.9 % IV SOLN: 250.0000 mL | INTRAVENOUS | Status: DC | PRN

## 2015-07-09 MED ORDER — ONDANSETRON HCL 4 MG PO TABS: 4.0000 mg | ORAL_TABLET | Freq: Four times a day (QID) | ORAL | Status: DC | PRN

## 2015-07-09 MED ORDER — ACETAMINOPHEN 650 MG RE SUPP: 650.0000 mg | Freq: Four times a day (QID) | RECTAL | Status: DC | PRN

## 2015-07-09 MED ORDER — SODIUM CHLORIDE 0.9 % IV BOLUS (SEPSIS)
1000.0000 mL | Freq: Once | INTRAVENOUS | Status: AC
  Administered 2015-07-09: 1000 mL via INTRAVENOUS

## 2015-07-09 MED ORDER — SIMVASTATIN 20 MG PO TABS
20.0000 mg | ORAL_TABLET | Freq: Every day | ORAL | Status: DC
  Administered 2015-07-09 – 2015-07-27 (×19): 20 mg via ORAL
  Filled 2015-07-09 (×13): qty 1
  Filled 2015-07-09: qty 2
  Filled 2015-07-09 (×7): qty 1

## 2015-07-09 MED ORDER — LOPERAMIDE HCL 2 MG PO CAPS: 4.0000 mg | ORAL_CAPSULE | Freq: Two times a day (BID) | ORAL | Status: DC

## 2015-07-09 MED ORDER — OXYCODONE HCL 5 MG PO TABS
5.0000 mg | ORAL_TABLET | ORAL | Status: DC | PRN
  Administered 2015-07-14 – 2015-07-18 (×2): 5 mg via ORAL
  Filled 2015-07-09 (×2): qty 1

## 2015-07-09 MED ORDER — METHYLPREDNISOLONE SODIUM SUCC 40 MG IJ SOLR
20.0000 mg | Freq: Three times a day (TID) | INTRAMUSCULAR | Status: DC
  Administered 2015-07-09 – 2015-07-14 (×15): 20 mg via INTRAVENOUS
  Filled 2015-07-09 (×2): qty 1
  Filled 2015-07-09 (×3): qty 0.5
  Filled 2015-07-09: qty 1
  Filled 2015-07-09 (×3): qty 0.5
  Filled 2015-07-09: qty 1
  Filled 2015-07-09 (×2): qty 0.5
  Filled 2015-07-09: qty 1
  Filled 2015-07-09 (×5): qty 0.5

## 2015-07-09 MED ORDER — SODIUM CHLORIDE 0.9 % IV SOLN: INTRAVENOUS | Status: DC

## 2015-07-09 MED ORDER — ONDANSETRON HCL 4 MG/2ML IJ SOLN
4.0000 mg | Freq: Two times a day (BID) | INTRAMUSCULAR | Status: DC
  Administered 2015-07-09 – 2015-07-27 (×37): 4 mg via INTRAVENOUS
  Filled 2015-07-09 (×35): qty 2

## 2015-07-09 MED ORDER — ALPRAZOLAM 0.25 MG PO TABS
0.2500 mg | ORAL_TABLET | Freq: Every day | ORAL | Status: DC
  Administered 2015-07-10 – 2015-07-28 (×19): 0.25 mg via ORAL
  Filled 2015-07-09 (×19): qty 1

## 2015-07-09 MED ORDER — POTASSIUM CHLORIDE IN NACL 20-0.9 MEQ/L-% IV SOLN
INTRAVENOUS | Status: DC
  Administered 2015-07-09 – 2015-07-10 (×3): via INTRAVENOUS
  Filled 2015-07-09 (×4): qty 1000

## 2015-07-09 MED ORDER — TRAZODONE HCL 50 MG PO TABS
50.0000 mg | ORAL_TABLET | Freq: Every evening | ORAL | Status: DC | PRN
  Administered 2015-07-13 – 2015-07-14 (×2): 50 mg via ORAL
  Filled 2015-07-09 (×3): qty 1

## 2015-07-09 MED ORDER — ONDANSETRON HCL 4 MG/2ML IJ SOLN
4.0000 mg | Freq: Once | INTRAMUSCULAR | Status: AC
  Administered 2015-07-09: 4 mg via INTRAVENOUS
  Filled 2015-07-09: qty 2

## 2015-07-09 MED ORDER — MORPHINE SULFATE (PF) 2 MG/ML IV SOLN
2.0000 mg | INTRAVENOUS | Status: DC | PRN
  Administered 2015-07-10 – 2015-07-26 (×5): 2 mg via INTRAVENOUS
  Filled 2015-07-09 (×5): qty 1

## 2015-07-09 MED ORDER — SODIUM CHLORIDE 0.9 % IV SOLN: Freq: Once | INTRAVENOUS | Status: DC

## 2015-07-09 MED ORDER — METHYLPREDNISOLONE SODIUM SUCC 40 MG IJ SOLR: 40.0000 mg | INTRAMUSCULAR | Status: DC

## 2015-07-09 MED ORDER — FOLIC ACID 1 MG PO TABS
1.0000 mg | ORAL_TABLET | Freq: Every day | ORAL | Status: DC
  Administered 2015-07-09 – 2015-07-28 (×20): 1 mg via ORAL
  Filled 2015-07-09 (×22): qty 1

## 2015-07-09 MED ORDER — LEVOTHYROXINE SODIUM 100 MCG PO TABS
100.0000 ug | ORAL_TABLET | Freq: Every day | ORAL | Status: DC
  Administered 2015-07-10 – 2015-07-28 (×18): 100 ug via ORAL
  Filled 2015-07-09 (×23): qty 1

## 2015-07-09 MED ORDER — LOPERAMIDE HCL 2 MG PO CAPS
4.0000 mg | ORAL_CAPSULE | Freq: Two times a day (BID) | ORAL | Status: DC
  Administered 2015-07-09: 4 mg via ORAL
  Filled 2015-07-09: qty 2

## 2015-07-09 MED ORDER — ACETAMINOPHEN 325 MG PO TABS
650.0000 mg | ORAL_TABLET | Freq: Four times a day (QID) | ORAL | Status: DC | PRN
  Administered 2015-07-24: 650 mg via ORAL
  Filled 2015-07-09: qty 2

## 2015-07-09 MED ORDER — VITAMIN D3 25 MCG (1000 UNIT) PO TABS
5000.0000 [IU] | ORAL_TABLET | Freq: Every morning | ORAL | Status: DC
  Administered 2015-07-10 – 2015-07-28 (×19): 5000 [IU] via ORAL
  Filled 2015-07-09 (×21): qty 5

## 2015-07-09 MED ORDER — FERROUS SULFATE 325 (65 FE) MG PO TABS
325.0000 mg | ORAL_TABLET | Freq: Two times a day (BID) | ORAL | Status: DC
  Administered 2015-07-10 – 2015-07-18 (×17): 325 mg via ORAL
  Filled 2015-07-09 (×20): qty 1

## 2015-07-09 MED ORDER — PANTOPRAZOLE SODIUM 40 MG PO TBEC
40.0000 mg | DELAYED_RELEASE_TABLET | Freq: Every day | ORAL | Status: DC
  Administered 2015-07-10 – 2015-07-28 (×19): 40 mg via ORAL
  Filled 2015-07-09 (×22): qty 1

## 2015-07-09 MED ORDER — ALBUTEROL SULFATE (2.5 MG/3ML) 0.083% IN NEBU
2.5000 mg | INHALATION_SOLUTION | RESPIRATORY_TRACT | Status: DC | PRN
  Filled 2015-07-09: qty 3

## 2015-07-09 MED ORDER — MORPHINE SULFATE (PF) 4 MG/ML IV SOLN
4.0000 mg | Freq: Once | INTRAVENOUS | Status: AC
  Administered 2015-07-09: 4 mg via INTRAVENOUS
  Filled 2015-07-09: qty 1

## 2015-07-09 NOTE — ED Provider Notes (Signed)
CSN: [id number]     Arrival date & time 07/09/15  1333 History  First MD Initiated Contact with Patient 07/09/15 1414     Chief Complaint  Patient presents with  . Abdominal Pain  . Abnormal Lab   HPI Patient presents to the emergency room because of persistent trouble with diarrhea and abdominal pain. She is now getting weaker and lightheaded. The patient has been having trouble with diarrhea and abdominal cramping for at least the last month. She has seen several doctors including her primary doctor, the emergency room and eventually saw a gastroenterologist. She was initially treated for diverticulitis with ciprofloxacin. This did not help much and the symptoms persisted. Patient has had numerous episodes of diarrhea. She has been having several episodes per hour. She denies any trouble with vomiting. She has felt nauseated. She most recently had a colonoscopy by her gastroenterologist which demonstrated findings suggestive of inflammatory bowel disease. Patient was started on steroids yesterday. She was also evaluated in the GI clinic and had to be given IV fluids and potassium. Patient called her doctor today because she is feeling worse. Told her to come to the emergency room because she may require admission to the hospital.  Past Medical History  Diagnosis Date  . Thyroid disease   . Hypercholesterolemia   . Arthritis   . GERD (gastroesophageal reflux disease)   . Diverticulitis   . Renal insufficiency     stage 3 kidney disease  . Sleep apnea     occasionally wears a c-pap  . Anxiety   . Osteoporosis   . Ulcerative colitis [facility]    Past Surgical History  Procedure Laterality Date  . Partial hysterectomy  1978    vaginal  . Tonsillectomy  1967  . Appendectomy  1957  . Colonoscopy     Family History  Problem Relation Age of Onset  . Colon cancer Neg Hx   . Pancreatic cancer Neg Hx   . Stomach cancer Neg Hx   . Esophageal cancer Neg Hx   . Rectal cancer Neg Hx    Social  History  Substance Use Topics  . Smoking status: Never Smoker   . Smokeless tobacco: Never Used  . Alcohol Use: No   OB History    No data available     Review of Systems  All other systems reviewed and are negative.     Allergies  Codeine; Meloxicam; Penicillins; Propoxyphene hcl; and Tramadol  Home Medications   Prior to Admission medications   Medication Sig Start Date End Date Taking? Authorizing Provider  ALPRAZolam (XANAX) 0.25 MG tablet Take 0.25 mg by mouth daily.   Yes Historical Provider, MD  AMBULATORY NON FORMULARY MEDICATION Place 0.125 mg rectally 3 (three) times daily. Medication Name: Nitrogylcerin ointment 0.125% 06/29/15  Yes Ruffin Frederick, MD  aspirin 81 MG tablet Take 81 mg by mouth daily.   Yes Historical Provider, MD  fluticasone (FLONASE) 50 MCG/ACT nasal spray USE 2 SPRAYS EACH NOSTRIL DAILY AS NEEDED for allergies 06/01/15  Yes Historical Provider, MD  KLOR-CON M20 20 MEQ tablet Take 20 mEq by mouth 3 (three) times daily.  06/29/15  Yes Historical Provider, MD  levothyroxine (SYNTHROID, LEVOTHROID) 100 MCG tablet Take 100 mcg by mouth daily before breakfast.   Yes Historical Provider, MD  pantoprazole (PROTONIX) 40 MG tablet Take 40 mg by mouth daily.   Yes Historical Provider, MD  predniSONE (DELTASONE) 10 MG tablet Take 4 tablets by mouth with breakfast for 14  days and as directed Patient taking differently: Take 40 mg by mouth See admin instructions. Take 4 tablets by mouth with breakfast for 14 days and as directed 07/07/15  Yes Ruffin Frederick, MD  simvastatin (ZOCOR) 20 MG tablet Take 20 mg by mouth daily at 6 PM.    Yes Historical Provider, MD  VITAMIN D, CHOLECALCIFEROL, PO Take 5,000 Units by mouth.   Yes Historical Provider, MD  HYDROcodone-acetaminophen (NORCO/VICODIN) 5-325 MG tablet Take 1-2 tablets by mouth every 4 (four) hours as needed. Patient not taking: Reported on 06/29/2015 06/25/15   Emi Holes, PA-C  mesalamine  (LIALDA) 1.2 g EC tablet Take 4 tablets (4.8 g total) by mouth daily with breakfast. Patient not taking: Reported on 07/09/2015 07/06/15   Ruffin Frederick, MD  mesalamine (ROWASA) 4 g enema Place 60 mLs (4 g total) rectally at bedtime. Patient not taking: Reported on 07/09/2015 07/06/15   Ruffin Frederick, MD   BP 105/65 mmHg  Pulse 109  Temp(Src) 98.5 F (36.9 C) (Oral)  Resp 14  Ht 5' 3.5" (1.613 m)  Wt 104.327 kg  BMI 40.10 kg/m2  SpO2 98% Physical Exam  Constitutional: She has a sickly appearance. No distress.  HENT:  Head: Normocephalic and atraumatic.  Right Ear: External ear normal.  Left Ear: External ear normal.  Mouth/Throat: Mucous membranes are dry.  Eyes: Conjunctivae are normal. Right eye exhibits no discharge. Left eye exhibits no discharge. No scleral icterus.  Neck: Neck supple. No tracheal deviation present.  Cardiovascular: Regular rhythm and intact distal pulses.  Tachycardia present.   Pulmonary/Chest: Effort normal and breath sounds normal. No stridor. No respiratory distress. She has no wheezes. She has no rales.  Abdominal: Soft. Bowel sounds are normal. She exhibits no distension and no mass. There is generalized tenderness. There is no rigidity, no rebound, no guarding and no CVA tenderness. No hernia.  Musculoskeletal: She exhibits no edema or tenderness.  Neurological: She is alert. She has normal strength. No cranial nerve deficit (no facial droop, extraocular movements intact, no slurred speech) or sensory deficit. She exhibits normal muscle tone. She displays no seizure activity. Coordination normal.  Skin: Skin is warm and dry. No rash noted. She is not diaphoretic.  Psychiatric: She has a normal mood and affect.  Nursing note and vitals reviewed.   ED Course  Procedures  Medications  sodium chloride 0.9 % bolus 1,000 mL (1,000 mLs Intravenous New Bag/Given 07/09/15 1502)    And  sodium chloride 0.9 % bolus 1,000 mL (not administered)     And  0.9 %  sodium chloride infusion (not administered)  loperamide (IMODIUM) capsule 4 mg (not administered)  ondansetron (ZOFRAN) injection 4 mg (4 mg Intravenous Given 07/09/15 1505)  morphine 4 MG/ML injection 4 mg (4 mg Intravenous Given 07/09/15 1507)    Labs Review Labs Reviewed  COMPREHENSIVE METABOLIC PANEL - Abnormal; Notable for the following:    Sodium 133 (*)    Chloride 97 (*)    Glucose, Bld 206 (*)    BUN 22 (*)    Creatinine, Ser 1.54 (*)    Calcium 7.4 (*)    Total Protein 5.5 (*)    Albumin 1.9 (*)    Total Bilirubin 1.4 (*)    GFR calc non Af Amer 34 (*)    GFR calc Af Amer 39 (*)    All other components within normal limits  CBC - Abnormal; Notable for the following:    WBC 18.4 (*)  RBC 3.05 (*)    Hemoglobin 9.4 (*)    HCT 27.2 (*)    Platelets 572 (*)    All other components within normal limits  LIPASE, BLOOD  URINALYSIS, ROUTINE W REFLEX MICROSCOPIC (NOT AT [facility])   Medications  sodium chloride 0.9 % bolus 1,000 mL (1,000 mLs Intravenous New Bag/Given 07/09/15 1502)    And  sodium chloride 0.9 % bolus 1,000 mL (not administered)    And  0.9 %  sodium chloride infusion (not administered)  loperamide (IMODIUM) capsule 4 mg (not administered)  ondansetron (ZOFRAN) injection 4 mg (4 mg Intravenous Given 07/09/15 1505)  morphine 4 MG/ML injection 4 mg (4 mg Intravenous Given 07/09/15 1507)     MDM   Final diagnoses:  Dehydration  Diarrhea, unspecified type     Pt has persistent weakness, tachcyardia associated with her diarrhea and dehydration.  Increased wbc but on steroids.  Creatinine is still elevated.   Pt is not feeling better after IV fluids.  Discussed with GI.  Agrees with inpatient hydration.  They will see patient regarding further treatment.    Linwood Dibbles, MD 07/09/15 [phone]

## 2015-07-09 NOTE — ED Notes (Signed)
Pt sent by Dr. Fritzi Mandes for abd pain. Had bloodwork and a colonoscopy 2 days ago which showed ulcerative colitis. Pt had K of 2.9 and still felt unwell despite treatment. Was told to come here for possible admission.

## 2015-07-09 NOTE — Telephone Encounter (Signed)
-----   Message from Daphine Deutscher, RN sent at 07/08/2015  9:40 AM EDT ----- Call her and check on how she is doing. SA

## 2015-07-09 NOTE — H&P (Signed)
Patient Demographics:    Jamie Wood, is a 70 y.o. female  MRN: [id number]   DOB - [birth year]  Admit Date - 07/09/2015  Outpatient Primary MD for the patient is Jamie Bradford, MD   Assessment & Plan:    Principal Problem:   AKI (acute kidney injury) (HCC) Active Problems:   Anemia due to GI blood loss   Diarrhea in adult patient   Hypovolemia dehydration   Colitis, nonspecific    1)Colitis- Infectious Vs Inflammatory- CT abdomen and pelvis from 06/25/2015 suggested diffuse colonic wall thickening most prominent along the transverse colon and the hepatic flexure, patient was treated empirically with Cipro with no relief. Direct visualization with colonoscopic  Evaluation on 07/06/2015 suggested possible inflammatory colitis however biopsies from the colonoscopy did not support any chronic inflammation. Patient did not improve on mesalamine and steroids.  GI pathogen study was previously unremarkable. Abdominal pain and bloody Diarrhea persist , check stool for C. difficile, repeat GI pathogen study, treat empirically with Imodium. GI consult pending to help with management.  patient is very familiar to GI service, please note that the total bilirubin is up to 1.4. WBC is up to 18 most likely due to steroid therapy  2)AKI/Dehydration- patient has orthostatic dizziness secondary to #1 above, hydrate IV and orally, replace electrolytes as indicated. Creatinine is up to 1.54 today from a baseline of 1.0, GFR is down to 34 from a baseline of over 50. Avoid nephrotoxic agents including NSAIDs  3)Anemia- patient's hemoglobin continues to drift down, she is having bloody diarrhea from time to time, hemoglobin was 14.5 on 06/25/2015, hemoglobin was down to 11.3 and 07/06/2015 and hemoglobin today is down to 9.4, anticipated  for the drop in hemoglobin with hydration due to hemodilution. Check serial H&H, transfuse as clinically indicated. GI consult pending patient had colonoscopic evaluation on 07/06/2015 with findings of significant inflammatory changes in the distal colon   With History of - Reviewed by me  Past Medical History  Diagnosis Date  . Thyroid disease   . Hypercholesterolemia   . Arthritis   . GERD (gastroesophageal reflux disease)   . Diverticulitis   . Renal insufficiency     stage 3 kidney disease  . Sleep apnea     occasionally wears a c-pap  . Anxiety   . Osteoporosis   . Ulcerative colitis [facility])       Past Surgical History  Procedure Laterality Date  . Partial hysterectomy  1978    vaginal  . Tonsillectomy  1967  . Appendectomy  1957  . Colonoscopy        Chief Complaint  Patient presents with  . Abdominal Pain  . Abnormal Lab      HPI:    Jamie Wood  is a 70 y.o. female with abdominal pain and diarrhea which is at times bloody for about a month now. Symptoms has gotten worse and patient presents today  with dizziness, lightheadedness, and continued loose stools (at times bloody). In the ED the patient is noted to have a drop in H&H from her baseline and had creatinines trending up she is orthostatic and clinically dehydrated.  CT abdomen and pelvis from 06/25/2015 suggested diffuse colonic wall thickening most prominent along the transverse colon and the hepatic flexure, patient was treated empirically with Cipro with no relief. Direct visualization with colonoscopic  Evaluation on 07/06/2015 suggested possible inflammatory colitis however biopsies from the colonoscopy did not support any chronic inflammation. Patient did not improve on mesalamine and steroids. Leukocytosis may be from steroids, GI pathogen study was previously unremarkable. Abdominal pain and bloody Diarrhea persist. No fever no chills. No syncope, no chest pains. Patient denies any recent travel, she does  not eat out much at all, she does not have any pets, no sick contacts at home   Review of systems:    In addition to the HPI above,   A full 12 point Review of Systems was done, except as stated above, all other Review of Systems were negative.    Social History:  Reviewed by me    Social History  Substance Use Topics  . Smoking status: Never Smoker   . Smokeless tobacco: Never Used  . Alcohol Use: No       Family History :  Reviewed by me    Family History  Problem Relation Age of Onset  . Colon cancer Neg Hx   . Pancreatic cancer Neg Hx   . Stomach cancer Neg Hx   . Esophageal cancer Neg Hx   . Rectal cancer Neg Hx      Home Medications:   Prior to Admission medications   Medication Sig Start Date End Date Taking? Authorizing Provider  ALPRAZolam (XANAX) 0.25 MG tablet Take 0.25 mg by mouth daily.   Yes Historical Provider, MD  AMBULATORY NON FORMULARY MEDICATION Place 0.125 mg rectally 3 (three) times daily. Medication Name: Nitrogylcerin ointment 0.125% 06/29/15  Yes Ruffin Frederick, MD  aspirin 81 MG tablet Take 81 mg by mouth daily.   Yes Historical Provider, MD  fluticasone (FLONASE) 50 MCG/ACT nasal spray USE 2 SPRAYS EACH NOSTRIL DAILY AS NEEDED for allergies 06/01/15  Yes Historical Provider, MD  KLOR-CON M20 20 MEQ tablet Take 20 mEq by mouth 3 (three) times daily.  06/29/15  Yes Historical Provider, MD  levothyroxine (SYNTHROID, LEVOTHROID) 100 MCG tablet Take 100 mcg by mouth daily before breakfast.   Yes Historical Provider, MD  pantoprazole (PROTONIX) 40 MG tablet Take 40 mg by mouth daily.   Yes Historical Provider, MD  predniSONE (DELTASONE) 10 MG tablet Take 4 tablets by mouth with breakfast for 14 days and as directed Patient taking differently: Take 40 mg by mouth See admin instructions. Take 4 tablets by mouth with breakfast for 14 days and as directed 07/07/15  Yes Ruffin Frederick, MD  simvastatin (ZOCOR) 20 MG tablet Take 20 mg by mouth  daily at 6 PM.    Yes Historical Provider, MD  VITAMIN D, CHOLECALCIFEROL, PO Take 5,000 Units by mouth.   Yes Historical Provider, MD  HYDROcodone-acetaminophen (NORCO/VICODIN) 5-325 MG tablet Take 1-2 tablets by mouth every 4 (four) hours as needed. Patient not taking: Reported on 06/29/2015 06/25/15   Emi Holes, PA-C  mesalamine (LIALDA) 1.2 g EC tablet Take 4 tablets (4.8 g total) by mouth daily with breakfast. Patient not taking: Reported on 07/09/2015 07/06/15   Ruffin Frederick, MD  mesalamine (  ROWASA) 4 g enema Place 60 mLs (4 g total) rectally at bedtime. Patient not taking: Reported on 07/09/2015 07/06/15   Ruffin Frederick, MD     Allergies:     Allergies  Allergen Reactions  . Codeine Nausea And Vomiting    Pt is unable to recall all reactions to codeine  . Meloxicam Other (See Comments)    Due to stage III kidney disease  . Penicillins     Has patient had a PCN reaction causing immediate rash, facial/tongue/throat swelling, SOB or lightheadedness with hypotension: unknown Has patient had a PCN reaction causing severe rash involving mucus membranes or skin necrosis: unknown Has patient had a PCN reaction that required hospitalization: yes, drs visit Has patient had a PCN reaction occurring within the last 10 years: yes If all of the above answers are "NO", then may proceed with Cephalosporin use.   Marland Kitchen Propoxyphene Hcl Nausea And Vomiting  . Tramadol Nausea Only    jittery     Physical Exam:   Vitals  Blood pressure 113/66, pulse 88, temperature 98.5 F (36.9 C), temperature source Oral, resp. rate 18, height 5' 3.5" (1.613 m), weight 104.327 kg (230 lb), SpO2 96 %.  Physical Examination: General appearance - alert, obese, and in no distress Mental status - alert, oriented to person, place, and time,  Eyes - sclera anicteric Neck - supple, no JVD elevation , Chest - clear  to auscultation bilaterally, symmetrical air movement,  Heart - S1 and S2  normal,  Abdomen - soft,   nondistended, no masses or organomegaly, generalized lower abdominal tenderness without rebound or guarding, CVA areas are nontender Neurological - screening mental status exam normal, neck supple without rigidity, cranial nerves II through XII intact, DTR's normal and symmetric Extremities - no pedal edema noted, intact peripheral pulses  Skin - warm, dry    Data Review:    CBC  Recent Labs Lab 07/06/15 1133 07/09/15 1423  WBC 10.3 18.4*  HGB 11.6* 9.4*  HCT 34.6* 27.2*  PLT 460.0* 572*  MCV 89.8 89.2  MCH  --  30.8  MCHC 33.4 34.6  RDW 14.9 15.2  LYMPHSABS 0.6*  --   MONOABS 1.4*  --   EOSABS 0.0  --   BASOSABS 0.0  --    ------------------------------------------------------------------------------------------------------------------  Chemistries   Recent Labs Lab 07/06/15 1133 07/09/15 1423  NA 135 133*  K 2.9* 3.5  CL 93* 97*  CO2 30 27  GLUCOSE 147* 206*  BUN 21 22*  CREATININE 1.51* 1.54*  CALCIUM 7.3* 7.4*  AST 26 41  ALT 12 22  ALKPHOS 46 58  BILITOT 0.4 1.4*   ------------------------------------------------------------------------------------------------------------------ estimated creatinine clearance is 40.8 mL/min (by C-G formula based on Cr of 1.54). ------------------------------------------------------------------------------------------------------------------ No results for input(s): TSH, T4TOTAL, T3FREE, THYROIDAB in the last 72 hours.  Invalid input(s): FREET3   Coagulation profile No results for input(s): INR, PROTIME in the last 168 hours. ------------------------------------------------------------------------------------------------------------------- No results for input(s): DDIMER in the last 72 hours. -------------------------------------------------------------------------------------------------------------------  Cardiac Enzymes No results for input(s): CKMB, TROPONINI, MYOGLOBIN in the last 168  hours.  Invalid input(s): CK ------------------------------------------------------------------------------------------------------------------ No results found for: BNP   ---------------------------------------------------------------------------------------------------------------  Urinalysis    Component Value Date/Time   COLORURINE AMBER* 06/25/2015 1049   APPEARANCEUR CLEAR 06/25/2015 1049   LABSPEC >1.046* 06/25/2015 1049   PHURINE 5.5 06/25/2015 1049   GLUCOSEU NEGATIVE 06/25/2015 1049   HGBUR NEGATIVE 06/25/2015 1049   BILIRUBINUR SMALL* 06/25/2015 1049   KETONESUR 40* 06/25/2015  1049   PROTEINUR NEGATIVE 06/25/2015 1049   NITRITE NEGATIVE 06/25/2015 1049   LEUKOCYTESUR NEGATIVE 06/25/2015 1049    ----------------------------------------------------------------------------------------------------------------   Imaging Results:    No results found.  Radiological Exams on Admission: No results found.  DVT Prophylaxis SCD  AM Labs Ordered, also please review Full Orders  Family Communication: Admission, patients condition and plan of care including tests being ordered have been discussed with the patient who indicate understanding and agree with the plan   Code Status - Full Code  Likely DC to  home  Condition   Stable  Keleigh Kazee M.D on 07/09/2015 at 4:52 PM   Between 7am to 7pm - Pager - [phone]  After 7pm go to [email or web address] - password [email or web address]  [facility] - Office  [phone]  Dragon dictation system was used to create this note, attempts have been made to correct errors, however presence of uncorrected errors is not a reflection quality of care provided.

## 2015-07-09 NOTE — Telephone Encounter (Signed)
I called the patient to see how she was doing. She reports feeling "terrible". Feels lightheaded and dizzy. No significant abdominal pains but she has ongoing loose stools. On prednisone for 2 days now, she does not think significantly improved. She had not taken the Lialda due to AKI noted on labs. We gave her 1.5L of NS with potassium replacement yesterday as outpatient. She lives by herself and is asked if she can be admitted since she is feeling worse. Given her recent labs with AKI, hypokalemia with elevated inflammatory markers, suspect she may need admission, but she will go to the ER for labs and initial assessment to see if she warrants admission. Unclear what time she will be available to go, awaiting on her brother to arrive and take her. I called the ER and spoke with charge nurse about her case. I have spoke with our inpatient GI service about her case in case she needs admission.   Biopsies suprisingly show only mild inflammatory changes, path not consistent with IBD initially. I will need to speak with pathology about this result. She has yet to submit GI pathologen panel which was previously requested.  I think given her course that IBD is most likely, although atypical infectious process is possible. Ischemia seems very unlikely, given time course and involvement of the rectum.

## 2015-07-09 NOTE — Telephone Encounter (Signed)
Spoke with patient and she had a little more energy yesterday. Started Prednisone yesterday and took it today. Feels a little weaker today than yesterday.

## 2015-07-09 NOTE — ED Notes (Signed)
RN starting IV will collect labs 

## 2015-07-09 NOTE — Telephone Encounter (Signed)
Spoke with patient and she had a little more energy yesterday. Started Prednisone yesterday and took it today. She had used the Rowasa enema last night. Told patient to hold this also. She states she is having less diarrhea. Felt a little stronger yesterday than today. She will push fluids. She will try Pedialyte since the Gatoraid is a little to acidic.

## 2015-07-09 NOTE — ED Notes (Signed)
Urine specimen had bloody stool mixed in so unable to use. Made Hospitalist aware.

## 2015-07-09 NOTE — ED Notes (Signed)
Pt states she doesn't have to void at this time

## 2015-07-09 NOTE — ED Notes (Signed)
GI MD at bedside

## 2015-07-09 NOTE — Consult Note (Signed)
Referring Provider: Dr. Lynelle Wood Primary Care Physician:  Jamie Bradford, MD Primary Gastroenterologist:  Dr. Adela Wood  Reason for Consultation:  Colitis   HPI: Jamie Wood is a 70 y.o. female who is known to Dr. Adela Wood.  She was admitted to the hospital earlier this month with colitis that was suspected to be infectious in nature. She was treated with antibiotics and at outpatient follow-up she was improving. Short time later her symptoms seemed to return or worsen again and she was scheduled for colonoscopy, which she underwent on May 23. This showed a lot of superficial inflammation in the rectum, rectosigmoid colon, and sigmoid colon. Procedure actually was not advanced past that area due to the amount of inflammation. It was suspected that she possibly had ulcerative colitis, but the pathology shows benign colonic mucosa with some mild active colitis, but no definite chronic changes. Her CRP and sedimentation rate were elevated and she was started on Lialda and Rowasa. She then called our office 3 days ago and said that she is not feeling better. Labs were performed and showed some acute kidney injury and hypokalemia. It was arranged for her to receive IV fluids with potassium replacement as an outpatient, which I believe she received 5/24. Lialda and Rowasa were discontinued due to AKI.  She was started on prednisone 40 mg daily as well. Dr. Adela Wood called her in follow-up today and she reported feeling terrible with dizziness, lightheadedness, and continued loose stools.  Was sent to the ED for evaluation.  Her labs do not look terrible today. She does have a white count of 18, but once again has been on prednisone.  Cr elevated from baseline but stable from 3 days ago.  Clinically she does not look well and does not feel like she can go home.  She is going to be admitted for IV hydration, etc.     Past Medical History  Diagnosis Date  . Thyroid disease   . Hypercholesterolemia   .  Arthritis   . GERD (gastroesophageal reflux disease)   . Diverticulitis   . Renal insufficiency     stage 3 kidney disease  . Sleep apnea     occasionally wears a c-pap  . Anxiety   . Osteoporosis   . Ulcerative colitis [facility])     Past Surgical History  Procedure Laterality Date  . Partial hysterectomy  1978    vaginal  . Tonsillectomy  1967  . Appendectomy  1957  . Colonoscopy      Prior to Admission medications   Medication Sig Start Date End Date Taking? Authorizing Provider  ALPRAZolam (XANAX) 0.25 MG tablet Take 0.25 mg by mouth daily.   Yes Historical Provider, MD  AMBULATORY NON FORMULARY MEDICATION Place 0.125 mg rectally 3 (three) times daily. Medication Name: Nitrogylcerin ointment 0.125% 06/29/15  Yes Jamie Frederick, MD  aspirin 81 MG tablet Take 81 mg by mouth daily.   Yes Historical Provider, MD  fluticasone (FLONASE) 50 MCG/ACT nasal spray USE 2 SPRAYS EACH NOSTRIL DAILY AS NEEDED for allergies 06/01/15  Yes Historical Provider, MD  KLOR-CON M20 20 MEQ tablet Take 20 mEq by mouth 3 (three) times daily.  06/29/15  Yes Historical Provider, MD  levothyroxine (SYNTHROID, LEVOTHROID) 100 MCG tablet Take 100 mcg by mouth daily before breakfast.   Yes Historical Provider, MD  pantoprazole (PROTONIX) 40 MG tablet Take 40 mg by mouth daily.   Yes Historical Provider, MD  predniSONE (DELTASONE) 10 MG tablet Take 4 tablets by  mouth with breakfast for 14 days and as directed Patient taking differently: Take 40 mg by mouth See admin instructions. Take 4 tablets by mouth with breakfast for 14 days and as directed 07/07/15  Yes Jamie Frederick, MD  simvastatin (ZOCOR) 20 MG tablet Take 20 mg by mouth daily at 6 PM.    Yes Historical Provider, MD  VITAMIN D, CHOLECALCIFEROL, PO Take 5,000 Units by mouth.   Yes Historical Provider, MD  HYDROcodone-acetaminophen (NORCO/VICODIN) 5-325 MG tablet Take 1-2 tablets by mouth every 4 (four) hours as needed. Patient not taking:  Reported on 06/29/2015 06/25/15   Jamie Holes, PA-C  mesalamine (LIALDA) 1.2 g EC tablet Take 4 tablets (4.8 g total) by mouth daily with breakfast. Patient not taking: Reported on 07/09/2015 07/06/15   Jamie Frederick, MD  mesalamine (ROWASA) 4 g enema Place 60 mLs (4 g total) rectally at bedtime. Patient not taking: Reported on 07/09/2015 07/06/15   Jamie Frederick, MD    Current Facility-Administered Medications  Medication Dose Route Frequency Provider Last Rate Last Dose  . sodium chloride 0.9 % bolus 1,000 mL  1,000 mL Intravenous Once Jamie Dibbles, MD 1,000 mL/hr at 07/09/15 1502 1,000 mL at 07/09/15 1502   And  . sodium chloride 0.9 % bolus 1,000 mL  1,000 mL Intravenous Once Jamie Dibbles, MD       And  . 0.9 %  sodium chloride infusion   Intravenous Continuous Jamie Dibbles, MD       Current Outpatient Prescriptions  Medication Sig Dispense Refill  . ALPRAZolam (XANAX) 0.25 MG tablet Take 0.25 mg by mouth daily.    . AMBULATORY NON FORMULARY MEDICATION Place 0.125 mg rectally 3 (three) times daily. Medication Name: Nitrogylcerin ointment 0.125% 30 g 0  . aspirin 81 MG tablet Take 81 mg by mouth daily.    . fluticasone (FLONASE) 50 MCG/ACT nasal spray USE 2 SPRAYS EACH NOSTRIL DAILY AS NEEDED for allergies  12  . KLOR-CON M20 20 MEQ tablet Take 20 mEq by mouth 3 (three) times daily.     Marland Kitchen levothyroxine (SYNTHROID, LEVOTHROID) 100 MCG tablet Take 100 mcg by mouth daily before breakfast.    . pantoprazole (PROTONIX) 40 MG tablet Take 40 mg by mouth daily.    . predniSONE (DELTASONE) 10 MG tablet Take 4 tablets by mouth with breakfast for 14 days and as directed (Patient taking differently: Take 40 mg by mouth See admin instructions. Take 4 tablets by mouth with breakfast for 14 days and as directed) 100 tablet 0  . simvastatin (ZOCOR) 20 MG tablet Take 20 mg by mouth daily at 6 PM.     . VITAMIN D, CHOLECALCIFEROL, PO Take 5,000 Units by mouth.    Marland Kitchen HYDROcodone-acetaminophen  (NORCO/VICODIN) 5-325 MG tablet Take 1-2 tablets by mouth every 4 (four) hours as needed. (Patient not taking: Reported on 06/29/2015) 15 tablet 0  . mesalamine (LIALDA) 1.2 g EC tablet Take 4 tablets (4.8 g total) by mouth daily with breakfast. (Patient not taking: Reported on 07/09/2015) 120 tablet 1  . mesalamine (ROWASA) 4 g enema Place 60 mLs (4 g total) rectally at bedtime. (Patient not taking: Reported on 07/09/2015) 14 Bottle 0    Allergies as of 07/09/2015 - Review Complete 07/09/2015  Allergen Reaction Noted  . Codeine Nausea And Vomiting 06/08/2009  . Meloxicam Other (See Comments) 06/29/2015  . Penicillins  06/08/2009  . Propoxyphene hcl Nausea And Vomiting 06/08/2009  . Tramadol Nausea Only 12/04/2012  Family History  Problem Relation Age of Onset  . Colon cancer Neg Hx   . Pancreatic cancer Neg Hx   . Stomach cancer Neg Hx   . Esophageal cancer Neg Hx   . Rectal cancer Neg Hx     Social History   Social History  . Marital Status: Single    Spouse Name: N/A  . Number of Children: N/A  . Years of Education: N/A   Occupational History  . Not on file.   Social History Main Topics  . Smoking status: Never Smoker   . Smokeless tobacco: Never Used  . Alcohol Use: No  . Drug Use: No  . Sexual Activity: Not on file   Other Topics Concern  . Not on file   Social History Narrative    Review of Systems: Ten point ROS is O/W negative except as mentioned in HPI.  Physical Exam: Vital signs in last 24 hours: Temp:  [98.5 F (36.9 C)] 98.5 F (36.9 C) (05/26 1338) Pulse Rate:  [109] 109 (05/26 1338) Resp:  [14] 14 (05/26 1338) BP: (105)/(65) 105/65 mmHg (05/26 1338) SpO2:  [98 %] 98 % (05/26 1338) Weight:  [230 lb (104.327 kg)] 230 lb (104.327 kg) (05/26 1338)   General:  Alert, Well-developed, well-nourished, pleasant and cooperative in NAD Head:  Normocephalic and atraumatic. Eyes:  Sclera clear, no icterus.  Conjunctiva pink. Ears:  Normal auditory  acuity. Mouth:  No deformity or lesions.   Lungs:  Clear throughout to auscultation.  No wheezes, crackles, or rhonchi.  Heart:  Slightly tachy.  No murmurs noted. Abdomen:  Soft, non-distended.  BS present.  Mild diffuse TTP. Rectal:  Deferred.  Msk:  Symmetrical without gross deformities. Pulses:  Normal pulses noted. Extremities:  Without clubbing or edema. Neurologic:  Alert and oriented x 4;  grossly normal neurologically. Skin:  Intact without significant lesions or rashes. Psych:  Alert and cooperative. Normal mood and affect.  Lab Results:  Recent Labs  07/09/15 1423  WBC 18.4*  HGB 9.4*  HCT 27.2*  PLT 572*   BMET  Recent Labs  07/09/15 1423  NA 133*  K 3.5  CL 97*  CO2 27  GLUCOSE 206*  BUN 22*  CREATININE 1.54*  CALCIUM 7.4*   LFT  Recent Labs  07/09/15 1423  PROT 5.5*  ALBUMIN 1.9*  AST 41  ALT 22  ALKPHOS 58  BILITOT 1.4*   IMPRESSION:  -70 year old female with active colitis, suspected to be ulcerative colitis, but no chronic features seen on recent biopsies. Is being admitted to the hospital for ongoing diarrhea, acute kidney injury, dehydration.  PLAN: -Will put her on scheduled Imodium 2 tab BID. -? Continue PO prednisone 40 mg daily vs IV solumedrol. -IV fluids, supportive care.  Wood, Jamie D.  07/09/2015, 3:41 PM  Pager number [phone]     ________________________________________________________________________  Jamie Wood GI MD note:  I personally examined the patient, reviewed the data and agree with the assessment and plan described above.  Most suspicious for IBD, UC.  No chronic features on biopsies but perhaps this is truly the start of her illness (pan colitis on CT). Agree with resending Gi pathogen panel and retesting for C. Diff.  I'm going to change to IV steroids (20mg  IV every 8 hours), will add imodium 2 pills twice daily, should encourage PO intake (starting full liquids for now), empiric zofran twice daily, IV  fluids.  Will follow along.   Jamie Bunting, MD [facility] Pager [phone]

## 2015-07-10 DIAGNOSIS — K529 Noninfective gastroenteritis and colitis, unspecified: Secondary | ICD-10-CM

## 2015-07-10 DIAGNOSIS — D649 Anemia, unspecified: Secondary | ICD-10-CM | POA: Diagnosis present

## 2015-07-10 LAB — URINALYSIS, ROUTINE W REFLEX MICROSCOPIC
Bilirubin Urine: NEGATIVE
Glucose, UA: NEGATIVE mg/dL
HGB URINE DIPSTICK: NEGATIVE
KETONES UR: NEGATIVE mg/dL
Leukocytes, UA: NEGATIVE
Nitrite: NEGATIVE
PROTEIN: NEGATIVE mg/dL
Specific Gravity, Urine: 1.018 (ref 1.005–1.030)
pH: 6 (ref 5.0–8.0)

## 2015-07-10 LAB — HEMOGLOBIN AND HEMATOCRIT, BLOOD
HCT: 26.4 % — ABNORMAL LOW (ref 36.0–46.0)
Hemoglobin: 8.9 g/dL — ABNORMAL LOW (ref 12.0–15.0)

## 2015-07-10 LAB — GASTROINTESTINAL PANEL BY PCR, STOOL (REPLACES STOOL CULTURE)
ASTROVIRUS: NOT DETECTED
Adenovirus F40/41: NOT DETECTED
CRYPTOSPORIDIUM: NOT DETECTED
CYCLOSPORA CAYETANENSIS: NOT DETECTED
Campylobacter species: NOT DETECTED
E. COLI O157: NOT DETECTED
ENTAMOEBA HISTOLYTICA: NOT DETECTED
ENTEROAGGREGATIVE E COLI (EAEC): NOT DETECTED
Enteropathogenic E coli (EPEC): NOT DETECTED
Enterotoxigenic E coli (ETEC): NOT DETECTED
Giardia lamblia: NOT DETECTED
Norovirus GI/GII: NOT DETECTED
Plesimonas shigelloides: NOT DETECTED
Rotavirus A: NOT DETECTED
SALMONELLA SPECIES: NOT DETECTED
SAPOVIRUS (I, II, IV, AND V): NOT DETECTED
Shiga like toxin producing E coli (STEC): NOT DETECTED
Shigella/Enteroinvasive E coli (EIEC): NOT DETECTED
VIBRIO CHOLERAE: NOT DETECTED
Vibrio species: NOT DETECTED
YERSINIA ENTEROCOLITICA: NOT DETECTED

## 2015-07-10 LAB — C DIFFICILE QUICK SCREEN W PCR REFLEX
C DIFFICILE (CDIFF) TOXIN: NEGATIVE
C Diff antigen: NEGATIVE
C Diff interpretation: NEGATIVE

## 2015-07-10 LAB — BASIC METABOLIC PANEL
ANION GAP: 6 (ref 5–15)
BUN: 18 mg/dL (ref 6–20)
CO2: 25 mmol/L (ref 22–32)
Calcium: 7 mg/dL — ABNORMAL LOW (ref 8.9–10.3)
Chloride: 101 mmol/L (ref 101–111)
Creatinine, Ser: 1.29 mg/dL — ABNORMAL HIGH (ref 0.44–1.00)
GFR calc Af Amer: 48 mL/min — ABNORMAL LOW (ref >60)
GFR, EST NON AFRICAN AMERICAN: 42 mL/min — AB (ref >60)
Glucose, Bld: 166 mg/dL — ABNORMAL HIGH (ref 65–99)
POTASSIUM: 3.3 mmol/L — AB (ref 3.5–5.1)
Sodium: 132 mmol/L — ABNORMAL LOW (ref 135–145)

## 2015-07-10 LAB — CBC
HEMATOCRIT: 26.6 % — AB (ref 36.0–46.0)
HEMOGLOBIN: 9 g/dL — AB (ref 12.0–15.0)
MCH: 29.2 pg (ref 26.0–34.0)
MCHC: 33.8 g/dL (ref 30.0–36.0)
MCV: 86.4 fL (ref 78.0–100.0)
Platelets: 347 10*3/uL (ref 150–400)
RBC: 3.08 MIL/uL — ABNORMAL LOW (ref 3.87–5.11)
RDW: 14.7 % (ref 11.5–15.5)
WBC: 18 10*3/uL — AB (ref 4.0–10.5)

## 2015-07-10 LAB — PREPARE RBC (CROSSMATCH)

## 2015-07-10 MED ORDER — CIPROFLOXACIN IN D5W 400 MG/200ML IV SOLN
400.0000 mg | Freq: Two times a day (BID) | INTRAVENOUS | Status: DC
  Administered 2015-07-10 – 2015-07-14 (×10): 400 mg via INTRAVENOUS
  Filled 2015-07-10 (×10): qty 200

## 2015-07-10 MED ORDER — SODIUM CHLORIDE 0.9 % IV SOLN: Freq: Once | INTRAVENOUS | Status: DC

## 2015-07-10 MED ORDER — METRONIDAZOLE IN NACL 5-0.79 MG/ML-% IV SOLN
500.0000 mg | Freq: Three times a day (TID) | INTRAVENOUS | Status: DC
  Administered 2015-07-10 – 2015-07-14 (×13): 500 mg via INTRAVENOUS
  Filled 2015-07-10 (×14): qty 100

## 2015-07-10 NOTE — Progress Notes (Signed)
Pt follow-up hgb is 9.0 this am, did not transfuse ordered 2 units of PRBC's per MD orders (only if hgb <8).

## 2015-07-10 NOTE — Consult Note (Signed)
[address] [place], [place] [address] Phone: [phone] FAX: [place]  1945-08-26 [id number]  CARE TEAM:  PCP: Vidal Schwalbe, MD  Outpatient Care Team: Patient Care Team: Harlan Stains, MD as PCP - General (Family Medicine) Manus Gunning, MD as Consulting Physician (Gastroenterology)  Inpatient Treatment Team: Treatment Team: Attending Provider: Charlynne Cousins, MD; Consulting Physician: Milus Banister, MD; Registered Nurse: Scarlett Presto Pinnix, RN; Consulting Physician: Nolon Nations, MD; Rounding Team: Garner Gavel, MD  This patient is a 70 y.o.female who presents today for surgical evaluation at the request of Dr Dyane Dustman.   Reason for evaluation: Persistent gastrointestinal bleed with colitis.  Concern for fulminant colitis in need for colectomy.  70 year old woman with history of hematochezia.  Has undergone endoscopies and found to have colitis.  Try to be treated with antibiotics and oral immunosuppression.  Worsening.  Admitted.  Starting on IV antibiotics.  Has had significant GI blood loss requiring transfusions.  Concern by gastroenterology that this is not well controlled and may require colectomy to save her life.  She had normal screening colonoscopies in 2004 2014.  Dr. Sydell Axon Birdie retired and Dr. Havery Moros assumed care.  Patient an episode of diarrhea and bloody stools.  Felt to be infectious.  Placed on oral ciprofloxacin/metronidazole 10 days.  Did not improve.  Underwent colonoscopy four days ago.  Biopsy showed active colitis.  Much inflammation on the left colon.  Proximal colon not really assessed.  Suspicious for ulcerative colitis clinically but biopsies just showed nonspecific active colitis.  Started on the Aldara and Rowasa.  No improvement after three days.  Showing evidence of kidney failure.  Started on oral steroids.  Felt worse with loose stools and dizziness.   Admitted from emergency room.  Significant bloody diarrhea requiring blood transfusions.  Now in the ICU.  Past Medical History  Diagnosis Date  . Thyroid disease   . Hypercholesterolemia   . Arthritis   . GERD (gastroesophageal reflux disease)   . Diverticulitis   . Renal insufficiency     stage 3 kidney disease  . Sleep apnea     occasionally wears a c-pap  . Anxiety   . Osteoporosis   . Ulcerative colitis [facility])     Past Surgical History  Procedure Laterality Date  . Partial hysterectomy  1978    vaginal  . Tonsillectomy  1967  . Appendectomy  1957  . Colonoscopy      Social History   Social History  . Marital Status: Single    Spouse Name: N/A  . Number of Children: N/A  . Years of Education: N/A   Occupational History  . Not on file.   Social History Main Topics  . Smoking status: Never Smoker   . Smokeless tobacco: Never Used  . Alcohol Use: No  . Drug Use: No  . Sexual Activity: Not on file   Other Topics Concern  . Not on file   Social History Narrative    Family History  Problem Relation Age of Onset  . Colon cancer Neg Hx   . Pancreatic cancer Neg Hx   . Stomach cancer Neg Hx   . Esophageal cancer Neg Hx   . Rectal cancer Neg Hx     Current Facility-Administered Medications  Medication Dose Route Frequency Provider Last Rate Last Dose  . 0.9 %  sodium chloride infusion   Intravenous Continuous Wille Glaser  Tomi Bamberger, MD      . 0.9 %  sodium chloride infusion  250 mL Intravenous PRN Courage Emokpae, MD      . 0.9 %  sodium chloride infusion   Intravenous Once Ritta Slot, NP      . 0.9 %  sodium chloride infusion   Intravenous Once Ritta Slot, NP      . 0.9 % NaCl with KCl 20 mEq/ L  infusion   Intravenous Continuous Roxan Hockey, MD 125 mL/hr at 07/10/15 0218    . acetaminophen (TYLENOL) tablet 650 mg  650 mg Oral Q6H PRN Roxan Hockey, MD       Or  . acetaminophen (TYLENOL) suppository 650 mg  650 mg Rectal Q6H PRN Courage Emokpae, MD      .  albuterol (PROVENTIL) (2.5 MG/3ML) 0.083% nebulizer solution 2.5 mg  2.5 mg Nebulization Q2H PRN Courage Emokpae, MD      . ALPRAZolam Duanne Moron) tablet 0.25 mg  0.25 mg Oral Daily Courage Emokpae, MD      . cholecalciferol (VITAMIN D) tablet 5,000 Units  5,000 Units Oral q morning - 10a Courage Emokpae, MD      . ciprofloxacin (CIPRO) IVPB 400 mg  400 mg Intravenous BID Milus Banister, MD   400 mg at 07/10/15 0333  . ferrous sulfate tablet 325 mg  325 mg Oral BID WC Roxan Hockey, MD      . folic acid (FOLVITE) tablet 1 mg  1 mg Oral Daily Roxan Hockey, MD   1 mg at 07/09/15 2223  . levothyroxine (SYNTHROID, LEVOTHROID) tablet 100 mcg  100 mcg Oral QAC breakfast Courage Emokpae, MD      . methylPREDNISolone sodium succinate (SOLU-MEDROL) 40 mg/mL injection 20 mg  20 mg Intravenous Q8H Milus Banister, MD   20 mg at 07/10/15 0251  . metroNIDAZOLE (FLAGYL) IVPB 500 mg  500 mg Intravenous Q8H Milus Banister, MD   500 mg at 07/10/15 0336  . morphine 2 MG/ML injection 2 mg  2 mg Intravenous Q3H PRN Courage Emokpae, MD      . ondansetron (ZOFRAN) tablet 4 mg  4 mg Oral Q6H PRN Roxan Hockey, MD       Or  . ondansetron (ZOFRAN) injection 4 mg  4 mg Intravenous Q6H PRN Courage Emokpae, MD      . ondansetron (ZOFRAN) injection 4 mg  4 mg Intravenous BID Milus Banister, MD   4 mg at 07/09/15 2200  . oxyCODONE (Oxy IR/ROXICODONE) immediate release tablet 5 mg  5 mg Oral Q4H PRN Courage Emokpae, MD      . pantoprazole (PROTONIX) EC tablet 40 mg  40 mg Oral Daily Courage Emokpae, MD      . potassium chloride SA (K-DUR,KLOR-CON) CR tablet 20 mEq  20 mEq Oral TID Roxan Hockey, MD   20 mEq at 07/09/15 2203  . simvastatin (ZOCOR) tablet 20 mg  20 mg Oral q1800 Roxan Hockey, MD   20 mg at 07/09/15 2200  . sodium chloride flush (NS) 0.9 % injection 3 mL  3 mL Intravenous Q12H Roxan Hockey, MD   3 mL at 07/09/15 2223  . sodium chloride flush (NS) 0.9 % injection 3 mL  3 mL Intravenous PRN Roxan Hockey, MD      . traZODone (DESYREL) tablet 50 mg  50 mg Oral QHS PRN Roxan Hockey, MD         Allergies  Allergen Reactions  . Codeine Nausea And Vomiting  Pt is unable to recall all reactions to codeine  . Meloxicam Other (See Comments)    Due to stage III kidney disease  . Penicillins     Has patient had a PCN reaction causing immediate rash, facial/tongue/throat swelling, SOB or lightheadedness with hypotension: unknown Has patient had a PCN reaction causing severe rash involving mucus membranes or skin necrosis: unknown Has patient had a PCN reaction that required hospitalization: yes, drs visit Has patient had a PCN reaction occurring within the last 10 years: yes If all of the above answers are "NO", then may proceed with Cephalosporin use.   Marland Kitchen Propoxyphene Hcl Nausea And Vomiting  . Tramadol Nausea Only    jittery    ROS: Constitutional:  No fevers, chills, sweats.  Weight stable Eyes:  No vision changes, No discharge HENT:  No sore throats, nasal drainage Lymph: No neck swelling, No bruising easily Pulmonary:  No cough, productive sputum CV: No orthopnea, PND  Patient walks 20 minutes without difficulty.  No exertional chest/neck/shoulder/arm pain. GI:  No personal nor family history of GI/colon cancer, inflammatory bowel disease, irritable bowel syndrome, allergy such as Celiac Sprue, dietary/dairy problems, ulcers nor gastritis.  No recent sick contacts/gastroenteritis.  No travel outside the country.  No changes in diet. Renal: No UTIs, No hematuria Genital:  No drainage, bleeding, masses Musculoskeletal: No severe joint pain.  Good ROM major joints Skin:  No sores or lesions.  No rashes Heme/Lymph:  No easy bleeding.  No swollen lymph nodes Neuro: No focal weakness/numbness.  No seizures Psych: No suicidal ideation.  No hallucinations  BP 125/67 mmHg  Pulse 89  Temp(Src) 98.7 F (37.1 C) (Oral)  Resp 18  Ht 5' 3.5" (1.613 m)  Wt 104.327 kg (230 lb)   BMI 40.10 kg/m2  SpO2 98%  Physical Exam: General: Pt awake/alert/oriented x4 in mild acute distress.  Tired but not toxic Eyes: PERRL, normal EOM. Sclera nonicteric Neuro: CN II-XII intact w/o focal sensory/motor deficits. Lymph: No head/neck/groin lymphadenopathy Psych:  No delerium/psychosis/paranoia HENT: Normocephalic, Mucus membranes moist.  No thrush Neck: Supple, No tracheal deviation Chest: No pain.  Good respiratory excursion. CV:  Pulses intact.  Regular rhythm Abdomen: Soft, Nondistended.  Nontender.  No incarcerated hernias. Ext:  SCDs BLE.  No significant edema.  No cyanosis Skin: No petechiae / purpurea.  No major sores Musculoskeletal: No severe joint pain.  Good ROM major joints   Results:   Diagnosis 1. Surgical [P], random left colon - BENIGN COLONIC MUCOSA WITH MILD ACTIVE COLITIS, SEE COMMENT. 2. Surgical [P], left colon - BENIGN COLONIC MUCOSA WITH MILD ACTIVE COLITIS, SEE COMMENT. Microscopic Comment 1. and 2. Both specimens show similar features with fragments of benign appearing colonic mucosa generally displaying orderly crypt architecture but with associated mild active inflammation. No granulomata or adenomatous epithelium identified. The appearance may be related to infection, diverticulitis, early ischemia, etc. There is no evidence of malignancy. Clinical and endoscopic correlation is recommended. (BNS:gt, 07/08/15) Susanne Greenhouse MD Pathologist, Electronic Signature (Case signed 07/08/2015) Specimen Arhianna Ebey and Clinical Information Specimen Comment 1. Abnormal abdominal ct scan Specimen(s) Obtained: 1. Surgical [P], random left colon 2. Surgical [P], left colon Specimen Clinical Information 1. R/O malignancy 2. R/O microscopic colitis, IBD 1 of 2 FINAL for JONETTA, DAGLEY ([id number]) Caprina Wussow 1. Received in formalin is a tan, soft tissue fragment that is submitted in toto. Size: 0.2 cm, 1 block submitted. (TA) 2. Received in formalin are tan, soft  tissue fragments that are submitted in  toto. Number: three, Size: 0.2 cm, (1 B) ( TA ) Technical component for this case was performed at [facility], [place], [place], [place] [address] CLIA# [id number] Report signed out from the following location(s) Technical Component was performed at [facility] [address],[place] [address].BJSE:[id number]., Interpretation was performed at [facility] [address], [place], [place] [address]. CLIA #: [id number], 2 of  Labs: Results for orders placed or performed during the hospital encounter of 07/09/15 (from the past 44 hour(s))  MRSA PCR Screening     Status: None   Collection Time: 07/09/15  1:36 PM  Result Value Ref Range   MRSA by PCR NEGATIVE NEGATIVE    Comment:        The GeneXpert MRSA Assay (FDA approved for NASAL specimens only), is one component of a comprehensive MRSA colonization surveillance program. It is not intended to diagnose MRSA infection nor to guide or monitor treatment for MRSA infections.   Lipase, blood     Status: None   Collection Time: 07/09/15  2:23 PM  Result Value Ref Range   Lipase 34 11 - 51 U/L  Comprehensive metabolic panel     Status: Abnormal   Collection Time: 07/09/15  2:23 PM  Result Value Ref Range   Sodium 133 (L) 135 - 145 mmol/L   Potassium 3.5 3.5 - 5.1 mmol/L   Chloride 97 (L) 101 - 111 mmol/L   CO2 27 22 - 32 mmol/L   Glucose, Bld 206 (H) 65 - 99 mg/dL   BUN 22 (H) 6 - 20 mg/dL   Creatinine, Ser 1.54 (H) 0.44 - 1.00 mg/dL   Calcium 7.4 (L) 8.9 - 10.3 mg/dL   Total Protein 5.5 (L) 6.5 - 8.1 g/dL   Albumin 1.9 (L) 3.5 - 5.0 g/dL   AST 41 15 - 41 U/L   ALT 22 14 - 54 U/L   Alkaline Phosphatase 58 38 - 126 U/L   Total Bilirubin 1.4 (H) 0.3 - 1.2 mg/dL   GFR calc non Af Amer 34 (L) >60 mL/min   GFR calc Af Amer 39 (L) >60 mL/min    Comment: (NOTE) The eGFR has been calculated using the CKD EPI  equation. This calculation has not been validated in all clinical situations. eGFR's persistently <60 mL/min signify possible Chronic Kidney Disease.    Anion gap 9 5 - 15  CBC     Status: Abnormal   Collection Time: 07/09/15  2:23 PM  Result Value Ref Range   WBC 18.4 (H) 4.0 - 10.5 K/uL   RBC 3.05 (L) 3.87 - 5.11 MIL/uL   Hemoglobin 9.4 (L) 12.0 - 15.0 g/dL   HCT 27.2 (L) 36.0 - 46.0 %   MCV 89.2 78.0 - 100.0 fL   MCH 30.8 26.0 - 34.0 pg   MCHC 34.6 30.0 - 36.0 g/dL   RDW 15.2 11.5 - 15.5 %   Platelets 572 (H) 150 - 400 K/uL  CBC     Status: Abnormal   Collection Time: 07/09/15  8:17 PM  Result Value Ref Range   WBC 16.7 (H) 4.0 - 10.5 K/uL   RBC 2.35 (L) 3.87 - 5.11 MIL/uL   Hemoglobin 7.1 (L) 12.0 - 15.0 g/dL    Comment: DELTA CHECK NOTED REPEATED TO VERIFY    HCT 21.2 (L) 36.0 - 46.0 %   MCV 90.2 78.0 - 100.0 fL   MCH 30.2 26.0 - 34.0 pg   MCHC 33.5  30.0 - 36.0 g/dL   RDW 15.3 11.5 - 15.5 %   Platelets 426 (H) 150 - 400 K/uL  Type and screen Victor     Status: None (Preliminary result)   Collection Time: 07/09/15  8:17 PM  Result Value Ref Range   ABO/RH(D) A POS    Antibody Screen NEG    Sample Expiration 07/12/2015    Unit Number [id number]    Blood Component Type RED CELLS,LR    Unit division 00    Status of Unit ISSUED    Transfusion Status OK TO TRANSFUSE    Crossmatch Result Compatible    Unit Number [id number]    Blood Component Type RED CELLS,LR    Unit division 00    Status of Unit ISSUED    Transfusion Status OK TO TRANSFUSE    Crossmatch Result Compatible    Unit Number [id number]    Blood Component Type RED CELLS,LR    Unit division 00    Status of Unit ALLOCATED    Transfusion Status OK TO TRANSFUSE    Crossmatch Result Compatible    Unit Number [id number]    Blood Component Type RED CELLS,LR    Unit division 00    Status of Unit ALLOCATED    Transfusion Status OK TO TRANSFUSE    Crossmatch Result  Compatible   ABO/Rh     Status: None   Collection Time: 07/09/15  8:17 PM  Result Value Ref Range   ABO/RH(D) A POS   Prepare RBC     Status: None   Collection Time: 07/09/15  8:28 PM  Result Value Ref Range   Order Confirmation ORDER PROCESSED BY BLOOD BANK   Prepare RBC     Status: None   Collection Time: 07/10/15  1:30 AM  Result Value Ref Range   Order Confirmation ORDER PROCESSED BY BLOOD BANK   C difficile quick scan w PCR reflex     Status: None   Collection Time: 07/10/15  2:08 AM  Result Value Ref Range   C Diff antigen NEGATIVE NEGATIVE   C Diff toxin NEGATIVE NEGATIVE   C Diff interpretation Negative for toxigenic C. difficile   Basic metabolic panel     Status: Abnormal   Collection Time: 07/10/15  4:10 AM  Result Value Ref Range   Sodium 132 (L) 135 - 145 mmol/L   Potassium 3.3 (L) 3.5 - 5.1 mmol/L   Chloride 101 101 - 111 mmol/L   CO2 25 22 - 32 mmol/L   Glucose, Bld 166 (H) 65 - 99 mg/dL   BUN 18 6 - 20 mg/dL   Creatinine, Ser 1.29 (H) 0.44 - 1.00 mg/dL   Calcium 7.0 (L) 8.9 - 10.3 mg/dL   GFR calc non Af Amer 42 (L) >60 mL/min   GFR calc Af Amer 48 (L) >60 mL/min    Comment: (NOTE) The eGFR has been calculated using the CKD EPI equation. This calculation has not been validated in all clinical situations. eGFR's persistently <60 mL/min signify possible Chronic Kidney Disease.    Anion gap 6 5 - 15  CBC     Status: Abnormal   Collection Time: 07/10/15  4:10 AM  Result Value Ref Range   WBC 18.0 (H) 4.0 - 10.5 K/uL   RBC 3.08 (L) 3.87 - 5.11 MIL/uL   Hemoglobin 9.0 (L) 12.0 - 15.0 g/dL    Comment: RESULT REPEATED AND VERIFIED DELTA CHECK NOTED POST TRANSFUSION SPECIMEN    HCT  26.6 (L) 36.0 - 46.0 %   MCV 86.4 78.0 - 100.0 fL   MCH 29.2 26.0 - 34.0 pg   MCHC 33.8 30.0 - 36.0 g/dL   RDW 14.7 11.5 - 15.5 %   Platelets 347 150 - 400 K/uL    Imaging / Studies: Ct Abdomen Pelvis Wo Contrast  07/10/2015  CLINICAL DATA:  Acute onset of GI bleeding.   Initial encounter. EXAM: CT ABDOMEN AND PELVIS WITHOUT CONTRAST TECHNIQUE: Multidetector CT imaging of the abdomen and pelvis was performed following the standard protocol without IV contrast. COMPARISON:  CT of the abdomen and pelvis from 06/25/2015 FINDINGS: Trace right-sided pleural fluid is noted, with associated atelectasis. The liver and spleen are unremarkable in appearance. The gallbladder is within normal limits. The pancreas and adrenal glands are unremarkable. Scattered bilateral parapelvic renal cysts are seen. Minimal nonspecific perinephric stranding is noted bilaterally. There is no evidence of hydronephrosis. No renal or ureteral stones are identified. The small bowel is unremarkable in appearance. The stomach is within normal limits. No acute vascular abnormalities are seen. The patient is status post appendectomy. Vague soft tissue inflammation is noted along the ascending, descending and proximal sigmoid colon, concerning for acute infectious or inflammatory colitis. Underlying diverticulosis is noted along the transverse, descending and sigmoid colon. Trace fluid is seen tracking along the paracolic gutters. The bladder is mildly distended and grossly unremarkable. The patient is status post hysterectomy. The ovaries are grossly symmetric. No suspicious adnexal masses are seen. No inguinal lymphadenopathy is seen. No acute osseous abnormalities are identified. IMPRESSION: 1. Vague soft tissue inflammation along the ascending, descending and proximal sigmoid colon, concerning for acute infectious or inflammatory colitis. Trace fluid tracking along the paracolic gutters. 2. Underlying diverticulosis along the transverse, descending and sigmoid colon. 3. Scattered bilateral parapelvic renal cysts again noted. 4. Trace right-sided pleural fluid, with associated atelectasis. Electronically Signed   By: Garald Balding M.D.   On: 07/10/2015 00:00   Ct Abdomen Pelvis W Contrast  06/25/2015  CLINICAL  DATA:  Bright red diarrhea this morning. Lower abdominal pain. EXAM: CT ABDOMEN AND PELVIS WITH CONTRAST TECHNIQUE: Multidetector CT imaging of the abdomen and pelvis was performed using the standard protocol following bolus administration of intravenous contrast. CONTRAST:  158m ISOVUE-300 IOPAMIDOL (ISOVUE-300) INJECTION 61% COMPARISON:  10/18/2010 FINDINGS: Lower chest: Stable 5 mm nodule in the right middle lobe on sequence 4, image 10 appears to have a few calcifications and probably represents a benign granuloma. Stable punctate nodular density near the right minor fissure on sequence 4, image 4. Small amount atelectasis or scarring at the right lung base. Hepatobiliary: Normal appearance of the liver, gallbladder and portal venous system. Pancreas: Fatty changes throughout the pancreas without evidence for inflammation or duct dilatation. Spleen: Normal appearance of spleen without enlargement Adrenals/Urinary Tract: Normal bilateral adrenal glands. There are bilateral peripelvic cysts without hydronephrosis. Normal urinary bladder. There is probably a punctate nonobstructive right kidney stone in lower pole. Stomach/Bowel: There is extensive colon wall thickening, predominantly involving the transverse colon and hepatic flexure. There is small amount of pericolonic edema near the hepatic flexure. There is extensive diverticulosis involving the descending colon and the sigmoid colon. Difficult to exclude mild inflammation in the descending colon. There is oral contrast in the small bowel and right colon. No evidence for a small bowel obstruction. Normal appearance of the stomach and duodenum. Evidence for vascular engorgement in the omentum. Vascular/Lymphatic: Normal caliber of the abdominal aorta without significant atherosclerotic disease. Few scattered  periaortic lymph nodes have not significantly changed. There are prominent lymph nodes along the lymphatic drainage of the sigmoid colon on sequence 2,  images 66 and 63. There are enlarged lymph nodes near the left iliac chain which could also be along the lymphatic drainage of the left colon. These lymph nodes are best seen on sequence 2, image 59 and 60. Largest node measures 0.9 cm in the short axis. Reproductive: Uterus has been removed. Evidence for bilateral ovarian tissue without Jelan Batterton abnormality. Other: Trace amount of free fluid in the pelvis.  No free air. Musculoskeletal: Stable sclerotic density in the right ilium is suggestive for a bone island. No suspicious bone findings. IMPRESSION: Diffuse colonic wall thickening which is most prominent in the transverse colon and hepatic flexure. Findings are suggestive for colitis. This could be an infectious or inflammatory process. There is a small amount of fluid in the pelvis. Extensive diverticulosis involving the sigmoid colon and left colon but this does not appear to be the source for the colonic inflammation. Prominent lymph nodes along the lymphatic distribution of the sigmoid colon could be reactive. These nodes have enlarged since 2012 and consider a 3-6 month follow-up for surveillance. Bilateral renal cysts without hydronephrosis. Electronically Signed   By: Markus Daft M.D.   On: 06/25/2015 10:41    Medications / Allergies: per chart  Antibiotics: Anti-infectives    Start     Dose/Rate Route Frequency Ordered Stop   07/10/15 0330  metroNIDAZOLE (FLAGYL) IVPB 500 mg     500 mg 100 mL/hr over 60 Minutes Intravenous Every 8 hours 07/10/15 0241     07/10/15 0300  ciprofloxacin (CIPRO) IVPB 400 mg     400 mg 200 mL/hr over 60 Minutes Intravenous 2 times daily 07/10/15 0241        Assessment  Gillis Santa  70 y.o. female       Problem List:  Principal Problem:   Colitis, nonspecific Active Problems:   Anxiety state   GERD   Diverticulosis of large intestine   AKI (acute kidney injury) ([place])   Anemia due to GI blood loss   Diarrhea in adult patient   Hypovolemia  dehydration   Colitis with bloody diarrhea of uncertain etiology.  Refractory to oral antibiotics and oral immunosuppression.  Progressing to anemia and dehydration with signs of kidney failure/acute kidney injury  Plan:  Agree with ICU admission.  Transfuse to resolve shock.  IV antibiotics.  IV immunosuppression.  If she is refractory to all medical intervention, she may require abdominal colectomy with end ileostomy and rectal stump to control the colitis and bleeding.  I do not think she is exhausted all nonsurgical options just yet.  She has had symptomatic anemia but she is not on pressors.  Her creatinine did get elevated but only to 1.5 and his artery better after a few days of IV fluids.  Her hemoglobin did improve with transfusions this morning  Another option is to consider transfer to coronary referral center such as [facility] that may have other options to help control this.  Perhaps not ggod idea with her in the stepdown/SICU.  I do not think we have exhausted all her options.  -VTE prophylaxis- SCDs, etc  -mobilize as tolerated to help recovery  I updated the patient's status to the patient & Dr Ardis Hughs with GI.  Recommendations were made.  Questions were answered.  They expressed understanding & appreciation.    Adin Hector, M.D., F.A.C.S. Gastrointestinal and Minimally  Invasive Surgery [facility] [address] [place], [place] [address] [phone] Main / Paging   07/10/2015  Note: Portions of this report may have been transcribed using voice recognition software. Every effort was made to ensure accuracy; however, inadvertent computerized transcription errors may be present.   Any transcriptional errors that result from this process are unintentional.

## 2015-07-10 NOTE — Progress Notes (Signed)
[facility] Progress Note    Since last GI note: Hb nadir 7.1, good bump after 2 units.  Still with bloody stools.  Not in significant abd pains.  She is very thirsty  Objective: Vital signs in last 24 hours: Temp:  [97.7 F (36.5 C)-99.1 F (37.3 C)] 98.7 F (37.1 C) (05/27 0220) Pulse Rate:  [80-109] 88 (05/27 0700) Resp:  [14-22] 20 (05/27 0700) BP: (103-125)/(54-70) 109/62 mmHg (05/27 0700) SpO2:  [95 %-100 %] 95 % (05/27 0700) Weight:  [230 lb (104.327 kg)] 230 lb (104.327 kg) (05/26 1338) Last BM Date: 07/09/15 General: alert and oriented times 3 Heart: regular rate and rythm Abdomen: soft, mildly tender throughout, non-distended, normal bowel sounds   Lab Results:  Recent Labs  07/09/15 1423 07/09/15 2017 07/10/15 0410  WBC 18.4* 16.7* 18.0*  HGB 9.4* 7.1* 9.0*  PLT 572* 426* 347  MCV 89.2 90.2 86.4    Recent Labs  07/09/15 1423 07/10/15 0410  NA 133* 132*  K 3.5 3.3*  CL 97* 101  CO2 27 25  GLUCOSE 206* 166*  BUN 22* 18  CREATININE 1.54* 1.29*  CALCIUM 7.4* 7.0*    Recent Labs  07/09/15 1423  PROT 5.5*  ALBUMIN 1.9*  AST 41  ALT 22  ALKPHOS 58  BILITOT 1.4*   No results for input(s): INR in the last 72 hours.   Studies/Results: Ct Abdomen Pelvis Wo Contrast  07/10/2015  CLINICAL DATA:  Acute onset of GI bleeding.  Initial encounter. EXAM: CT ABDOMEN AND PELVIS WITHOUT CONTRAST TECHNIQUE: Multidetector CT imaging of the abdomen and pelvis was performed following the standard protocol without IV contrast. COMPARISON:  CT of the abdomen and pelvis from 06/25/2015 FINDINGS: Trace right-sided pleural fluid is noted, with associated atelectasis. The liver and spleen are unremarkable in appearance. The gallbladder is within normal limits. The pancreas and adrenal glands are unremarkable. Scattered bilateral parapelvic renal cysts are seen. Minimal nonspecific perinephric stranding is noted bilaterally. There is no evidence of  hydronephrosis. No renal or ureteral stones are identified. The small bowel is unremarkable in appearance. The stomach is within normal limits. No acute vascular abnormalities are seen. The patient is status post appendectomy. Vague soft tissue inflammation is noted along the ascending, descending and proximal sigmoid colon, concerning for acute infectious or inflammatory colitis. Underlying diverticulosis is noted along the transverse, descending and sigmoid colon. Trace fluid is seen tracking along the paracolic gutters. The bladder is mildly distended and grossly unremarkable. The patient is status post hysterectomy. The ovaries are grossly symmetric. No suspicious adnexal masses are seen. No inguinal lymphadenopathy is seen. No acute osseous abnormalities are identified. IMPRESSION: 1. Vague soft tissue inflammation along the ascending, descending and proximal sigmoid colon, concerning for acute infectious or inflammatory colitis. Trace fluid tracking along the paracolic gutters. 2. Underlying diverticulosis along the transverse, descending and sigmoid colon. 3. Scattered bilateral parapelvic renal cysts again noted. 4. Trace right-sided pleural fluid, with associated atelectasis. Electronically Signed   By: Roanna Raider M.D.   On: 07/10/2015 00:00     Medications: Scheduled Meds: . sodium chloride   Intravenous Once  . sodium chloride   Intravenous Once  . ALPRAZolam  0.25 mg Oral Daily  . cholecalciferol  5,000 Units Oral q morning - 10a  . ciprofloxacin  400 mg Intravenous BID  . ferrous sulfate  325 mg Oral BID WC  . folic acid  1 mg Oral Daily  . levothyroxine  100 mcg Oral QAC breakfast  .  methylPREDNISolone (SOLU-MEDROL) injection  20 mg Intravenous Q8H  . metronidazole  500 mg Intravenous Q8H  . ondansetron  4 mg Intravenous BID  . pantoprazole  40 mg Oral Daily  . potassium chloride SA  20 mEq Oral TID  . simvastatin  20 mg Oral q1800  . sodium chloride flush  3 mL Intravenous Q12H    Continuous Infusions: . sodium chloride    . 0.9 % NaCl with KCl 20 mEq / L 125 mL/hr at 07/10/15 0708   PRN Meds:.sodium chloride, acetaminophen **OR** acetaminophen, albuterol, morphine injection, ondansetron **OR** ondansetron (ZOFRAN) IV, oxyCODONE, sodium chloride flush, traZODone    Assessment/Plan: 70 y.o. female with fulminant colitis  I favor IBD(UC) > infection but with such an acute course (her symptoms date back 3-4 weeks at most) it is difficult to differentiate and path from recent colonoscopy showed only 'active coltis.' Will continue IV steroids, IV antibiotics, supportive care. Will allow clear liquids today.  Hopefully bleeding will slow, stools firm up in next few days.    After the past 12 hours, I'm less concerned that she will need colectomy urgently but she's not 'out of the woods' yet.  Appreciate input by Dr. Michaell Cowing.    Rachael Fee, MD  07/10/2015, 7:19 AM [facility] Pager [phone]

## 2015-07-10 NOTE — Progress Notes (Signed)
Pt has had another large dark bloody stool, [facility], NP informed.  VSS.

## 2015-07-10 NOTE — Progress Notes (Signed)
Pharmacy Antibiotic Note  Jamie Wood is a 70 y.o. female admitted on 07/09/2015 with colitis, fulminant, possibly infectious.  Pharmacy has been consulted for ciprofloxacin, flagyl dosing.  Plan: Ciprofloxacin 400mg  iv q12hr Flagyl 500mg  iv q8hr  Height: 5' 3.5" (161.3 cm) Weight: 230 lb (104.327 kg) IBW/kg (Calculated) : 53.55  Temp (24hrs), Avg:98.3 F (36.8 C), Min:97.7 F (36.5 C), Max:99.1 F (37.3 C)   Recent Labs Lab 07/06/15 1133 07/09/15 1423 07/09/15 2017  WBC 10.3 18.4* 16.7*  CREATININE 1.51* 1.54*  --     Estimated Creatinine Clearance: 40.8 mL/min (by C-G formula based on Cr of 1.54).    Allergies  Allergen Reactions  . Codeine Nausea And Vomiting    Pt is unable to recall all reactions to codeine  . Meloxicam Other (See Comments)    Due to stage III kidney disease  . Penicillins     Has patient had a PCN reaction causing immediate rash, facial/tongue/throat swelling, SOB or lightheadedness with hypotension: unknown Has patient had a PCN reaction causing severe rash involving mucus membranes or skin necrosis: unknown Has patient had a PCN reaction that required hospitalization: yes, drs visit Has patient had a PCN reaction occurring within the last 10 years: yes If all of the above answers are "NO", then may proceed with Cephalosporin use.   Marland Kitchen Propoxyphene Hcl Nausea And Vomiting  . Tramadol Nausea Only    jittery    Antimicrobials this admission: Ciprofloxacin 5/27 >> Flagyl 5/27 >>  Dose adjustments this admission: -  Microbiology results: pending  Thank you for allowing pharmacy to be a part of this patient's care.  Jamie Wood 07/10/2015 2:43 AM

## 2015-07-10 NOTE — Progress Notes (Signed)
[facility] PROGRESS NOTE    Progress Note  Jamie Wood  MRN:[id number] DOB: 10/09/1945 DOA: 07/09/2015 PCP: WHITE,CYNTHIA S, MD     Brief Narrative:   Jamie Wood is an 70 y.o. female past medical history no GI discomfort with a recent endoscopy and 07/03/2015 that was congestive of possible inflammatory colitis however biopsies are not revealed inflammation, patient was treated empirically with mesalamine steroids, and previous GI pathogen was unremarkable the comes into the ED complaining of abdominal pain and bloody diarrhea for a month which has progressively gotten worse now she feels dizzy and lightheadedness, and ED was found to have a drop in the hemoglobin mild increase in her creatinine.  Assessment/Plan:   Colitis, nonspecific/  Diarrhea in adult patient: UnClear breath is infectious versus inflammatory, GI pathogens previously living alone unremarkable C. difficile, is negative ESR is elevated, GI was consulted, they're leaning more towards inflammatory bowel disease with GI pathogen, IV steroids were increased and Imodium was started. Continue IV fluids and Zofran. Keep in step down.  Acute kidney injury/orthostatic hypotension: Likely due to area, he was started on aggressive IV fluid hydration and his creatinine is improving slowly.  Hyponatremia: Likely due to hypovolemia continue IV fluid hydration.  Normocytic anemia/anemia due to GI losses: Her hemoglobin has dropped from 14 last month to 9.4 on admission, this probably component of hemoconcentration.  She status 2 units of packed red blood cells her hemoglobin came up to 9.4.  Moderate protein caloric malnutrition: Abdomen is 1.9, have encourage her to eat.  DVT prophylaxis: SCD's Family Communication:wife Disposition Plan/Barrier to D/C: keep in SDU Code Status:     Code Status Orders        Start     Ordered   07/09/15 1736  Full code   Continuous     07/09/15 1735    Code Status History     Date Active Date Inactive Code Status Order ID Comments User Context   This patient has a current code status but no historical code status.    Advance Directive Documentation        Most Recent Value   Type of Advance Directive  Living will   Pre-existing out of facility DNR order (yellow form or pink MOST form)     "MOST" Form in Place?          IV Access:    Peripheral IV   Procedures and diagnostic studies:   Ct Abdomen Pelvis Wo Contrast  07/10/2015  CLINICAL DATA:  Acute onset of GI bleeding.  Initial encounter. EXAM: CT ABDOMEN AND PELVIS WITHOUT CONTRAST TECHNIQUE: Multidetector CT imaging of the abdomen and pelvis was performed following the standard protocol without IV contrast. COMPARISON:  CT of the abdomen and pelvis from 06/25/2015 FINDINGS: Trace right-sided pleural fluid is noted, with associated atelectasis. The liver and spleen are unremarkable in appearance. The gallbladder is within normal limits. The pancreas and adrenal glands are unremarkable. Scattered bilateral parapelvic renal cysts are seen. Minimal nonspecific perinephric stranding is noted bilaterally. There is no evidence of hydronephrosis. No renal or ureteral stones are identified. The small bowel is unremarkable in appearance. The stomach is within normal limits. No acute vascular abnormalities are seen. The patient is status post appendectomy. Vague soft tissue inflammation is noted along the ascending, descending and proximal sigmoid colon, concerning for acute infectious or inflammatory colitis. Underlying diverticulosis is noted along the transverse, descending and sigmoid colon. Trace fluid is seen tracking along the paracolic gutters.   The bladder is mildly distended and grossly unremarkable. The patient is status post hysterectomy. The ovaries are grossly symmetric. No suspicious adnexal masses are seen. No inguinal lymphadenopathy is seen. No acute osseous abnormalities are identified. IMPRESSION: 1.  Vague soft tissue inflammation along the ascending, descending and proximal sigmoid colon, concerning for acute infectious or inflammatory colitis. Trace fluid tracking along the paracolic gutters. 2. Underlying diverticulosis along the transverse, descending and sigmoid colon. 3. Scattered bilateral parapelvic renal cysts again noted. 4. Trace right-sided pleural fluid, with associated atelectasis. Electronically Signed   By: Jeffery  Chang M.D.   On: 07/10/2015 00:00     Medical Consultants:    None.  Anti-Infectives:   Cipro/Flagyl 5.26.2017  Subjective:    Jamie Wood she relates she feels about the same continues to have bloody bowel movement.  Objective:    Filed Vitals:   07/10/15 0400 07/10/15 0500 07/10/15 0600 07/10/15 0700  BP: 113/54 111/61 103/56 109/62  Pulse: 80 85 85 88  Temp:      TempSrc:      Resp: 19 20 19 20  Height:      Weight:      SpO2: 98% 95% 95% 95%    Intake/Output Summary (Last 24 hours) at 07/10/15 0726 Last data filed at 07/10/15 0700  Gross per 24 hour  Intake 3024.17 ml  Output    550 ml  Net 2474.17 ml   Filed Weights   07/09/15 1338  Weight: 104.327 kg (230 lb)    Exam: General exam: In no acute distress. Respiratory system: Good air movement and clear to auscultation. Cardiovascular system: S1 & S2 heard, RRR. No JVD, murmurs, rubs, gallops or clicks.  Gastrointestinal system: Abdomen is nondistended, soft and nontender.  Central nervous system: Alert and oriented. No focal neurological deficits. Extremities: No pedal edema. Skin: No rashes, lesions or ulcers Psychiatry: Judgement and insight appear normal. Mood & affect appropriate.    Data Reviewed:    Labs: Basic Metabolic Panel:  Recent Labs Lab 07/06/15 1133 07/09/15 1423 07/10/15 0410  NA 135 133* 132*  K 2.9* 3.5 3.3*  CL 93* 97* 101  CO2 30 27 25  GLUCOSE 147* 206* 166*  BUN 21 22* 18  CREATININE 1.51* 1.54* 1.29*  CALCIUM 7.3* 7.4* 7.0*    GFR Estimated Creatinine Clearance: 48.7 mL/min (by C-G formula based on Cr of 1.29). Liver Function Tests:  Recent Labs Lab 07/06/15 1133 07/09/15 1423  AST 26 41  ALT 12 22  ALKPHOS 46 58  BILITOT 0.4 1.4*  PROT 5.1* 5.5*  ALBUMIN 2.3* 1.9*    Recent Labs Lab 07/09/15 1423  LIPASE 34   No results for input(s): AMMONIA in the last 168 hours. Coagulation profile No results for input(s): INR, PROTIME in the last 168 hours.  CBC:  Recent Labs Lab 07/06/15 1133 07/09/15 1423 07/09/15 2017 07/10/15 0410  WBC 10.3 18.4* 16.7* 18.0*  NEUTROABS 8.2*  --   --   --   HGB 11.6* 9.4* 7.1* 9.0*  HCT 34.6* 27.2* 21.2* 26.6*  MCV 89.8 89.2 90.2 86.4  PLT 460.0* 572* 426* 347   Cardiac Enzymes: No results for input(s): CKTOTAL, CKMB, CKMBINDEX, TROPONINI in the last 168 hours. BNP (last 3 results) No results for input(s): PROBNP in the last 8760 hours. CBG: No results for input(s): GLUCAP in the last 168 hours. D-Dimer: No results for input(s): DDIMER in the last 72 hours. Hgb A1c: No results for input(s): HGBA1C in the last   72 hours. Lipid Profile: No results for input(s): CHOL, HDL, LDLCALC, TRIG, CHOLHDL, LDLDIRECT in the last 72 hours. Thyroid function studies: No results for input(s): TSH, T4TOTAL, T3FREE, THYROIDAB in the last 72 hours.  Invalid input(s): FREET3 Anemia work up: No results for input(s): VITAMINB12, FOLATE, FERRITIN, TIBC, IRON, RETICCTPCT in the last 72 hours. Sepsis Labs:  Recent Labs Lab 07/06/15 1133 07/09/15 1423 07/09/15 2017 07/10/15 0410  WBC 10.3 18.4* 16.7* 18.0*   Microbiology Recent Results (from the past 240 hour(s))  MRSA PCR Screening     Status: None   Collection Time: 07/09/15  1:36 PM  Result Value Ref Range Status   MRSA by PCR NEGATIVE NEGATIVE Final    Comment:        The GeneXpert MRSA Assay (FDA approved for NASAL specimens only), is one component of a comprehensive MRSA colonization surveillance program.  It is not intended to diagnose MRSA infection nor to guide or monitor treatment for MRSA infections.   C difficile quick scan w PCR reflex     Status: None   Collection Time: 07/10/15  2:08 AM  Result Value Ref Range Status   C Diff antigen NEGATIVE NEGATIVE Final   C Diff toxin NEGATIVE NEGATIVE Final   C Diff interpretation Negative for toxigenic C. difficile  Final     Medications:   . sodium chloride   Intravenous Once  . sodium chloride   Intravenous Once  . ALPRAZolam  0.25 mg Oral Daily  . cholecalciferol  5,000 Units Oral q morning - 10a  . ciprofloxacin  400 mg Intravenous BID  . ferrous sulfate  325 mg Oral BID WC  . folic acid  1 mg Oral Daily  . levothyroxine  100 mcg Oral QAC breakfast  . methylPREDNISolone (SOLU-MEDROL) injection  20 mg Intravenous Q8H  . metronidazole  500 mg Intravenous Q8H  . ondansetron  4 mg Intravenous BID  . pantoprazole  40 mg Oral Daily  . potassium chloride SA  20 mEq Oral TID  . simvastatin  20 mg Oral q1800  . sodium chloride flush  3 mL Intravenous Q12H   Continuous Infusions: . sodium chloride    . 0.9 % NaCl with KCl 20 mEq / L 125 mL/hr at 07/10/15 0708    Time spent: 25 min   LOS: 1 day   FELIZ ORTIZ,   [facility] Pager [phone]  *Please refer to [email or web address], password [email or web address] to get updated schedule on who will round on this patient, as hospitalists switch teams weekly. If 7PM-7AM, please contact night-coverage at [email or web address], password [email or web address] for any overnight needs.  07/10/2015, 7:26 AM         

## 2015-07-10 NOTE — Progress Notes (Signed)
Frank hematochezia 3-4 times since admission.  She says this is the character of her stooling for several days.  I viewed the output and it is blood, blood clot with little obvious stool.  Hb 7.1.  I ordered 2 units blood and she is completing her second unit now.  Repeat stat (non- IV contrast) Ct scan shows inflammation ascending, descending and sigmoid with some fluid around the colon.  She has mild abd discomfort but not significant pain or tenderness. She's been hemodynamically stable.  UC vs. Infectious: her illness has been 3-4 weeks and seems fulminant at this point. She had 1-2 days PO prednisone and has just started IV steroids 12 hours ago.  I spoke with General Surgery Dr. Michaell Cowing and we agreed to add IV antibiotics as well.  If she does not improve with IV steroids, IV Abx she will need colectomy.  Timing based on pace of her bleeding, illness.  After her second unit blood, will repeat CBC and will transfuse 2 more units if she doesn't bump above 8.

## 2015-07-11 DIAGNOSIS — K922 Gastrointestinal hemorrhage, unspecified: Secondary | ICD-10-CM | POA: Diagnosis present

## 2015-07-11 LAB — MAGNESIUM: Magnesium: 2.1 mg/dL (ref 1.7–2.4)

## 2015-07-11 LAB — COMPREHENSIVE METABOLIC PANEL
ALT: 18 U/L (ref 14–54)
AST: 26 U/L (ref 15–41)
Albumin: 1.3 g/dL — ABNORMAL LOW (ref 3.5–5.0)
Alkaline Phosphatase: 42 U/L (ref 38–126)
Anion gap: 5 (ref 5–15)
BILIRUBIN TOTAL: 0.5 mg/dL (ref 0.3–1.2)
BUN: 16 mg/dL (ref 6–20)
CALCIUM: 7.3 mg/dL — AB (ref 8.9–10.3)
CHLORIDE: 108 mmol/L (ref 101–111)
CO2: 22 mmol/L (ref 22–32)
CREATININE: 1.18 mg/dL — AB (ref 0.44–1.00)
GFR, EST AFRICAN AMERICAN: 54 mL/min — AB (ref >60)
GFR, EST NON AFRICAN AMERICAN: 46 mL/min — AB (ref >60)
Glucose, Bld: 172 mg/dL — ABNORMAL HIGH (ref 65–99)
Potassium: 5.3 mmol/L — ABNORMAL HIGH (ref 3.5–5.1)
Sodium: 135 mmol/L (ref 135–145)
TOTAL PROTEIN: 3.8 g/dL — AB (ref 6.5–8.1)

## 2015-07-11 LAB — CBC
HCT: 31.2 % — ABNORMAL LOW (ref 36.0–46.0)
HEMOGLOBIN: 7.2 g/dL — AB (ref 12.0–15.0)
MCH: 20.3 pg — AB (ref 26.0–34.0)
MCHC: 23.1 g/dL — AB (ref 30.0–36.0)
MCV: 87.9 fL (ref 78.0–100.0)
Platelets: 277 10*3/uL (ref 150–400)
RBC: 3.55 MIL/uL — AB (ref 3.87–5.11)
RDW: 15.6 % — ABNORMAL HIGH (ref 11.5–15.5)
WBC: 16.8 10*3/uL — ABNORMAL HIGH (ref 4.0–10.5)

## 2015-07-11 LAB — GLUCOSE, CAPILLARY
GLUCOSE-CAPILLARY: 170 mg/dL — AB (ref 65–99)
Glucose-Capillary: 189 mg/dL — ABNORMAL HIGH (ref 65–99)
Glucose-Capillary: 266 mg/dL — ABNORMAL HIGH (ref 65–99)

## 2015-07-11 LAB — PHOSPHORUS: Phosphorus: 3.3 mg/dL (ref 2.5–4.6)

## 2015-07-11 MED ORDER — FAT EMULSION 20 % IV EMUL
240.0000 mL | INTRAVENOUS | Status: AC
  Administered 2015-07-11: 240 mL via INTRAVENOUS
  Filled 2015-07-11: qty 250

## 2015-07-11 MED ORDER — SODIUM CHLORIDE 0.9% FLUSH
10.0000 mL | INTRAVENOUS | Status: DC | PRN
  Administered 2015-07-12: 40 mL
  Administered 2015-07-13 – 2015-07-14 (×2): 20 mL
  Administered 2015-07-15 – 2015-07-21 (×2): 10 mL
  Filled 2015-07-11 (×5): qty 40

## 2015-07-11 MED ORDER — SODIUM CHLORIDE 0.9 % IV SOLN
Freq: Once | INTRAVENOUS | Status: AC
  Administered 2015-07-11: 09:00:00 via INTRAVENOUS

## 2015-07-11 MED ORDER — INSULIN ASPART 100 UNIT/ML ~~LOC~~ SOLN
0.0000 [IU] | SUBCUTANEOUS | Status: DC
  Administered 2015-07-11 (×2): 3 [IU] via SUBCUTANEOUS
  Administered 2015-07-11: 8 [IU] via SUBCUTANEOUS
  Administered 2015-07-12 (×2): 3 [IU] via SUBCUTANEOUS
  Administered 2015-07-12: 2 [IU] via SUBCUTANEOUS

## 2015-07-11 MED ORDER — M.V.I. ADULT IV INJ
INJECTION | INTRAVENOUS | Status: AC
  Administered 2015-07-11: 18:00:00 via INTRAVENOUS
  Filled 2015-07-11: qty 960

## 2015-07-11 MED ORDER — SODIUM CHLORIDE 0.9% FLUSH
10.0000 mL | Freq: Two times a day (BID) | INTRAVENOUS | Status: DC
  Administered 2015-07-13 – 2015-07-28 (×15): 10 mL

## 2015-07-11 MED ORDER — ALTEPLASE 100 MG IV SOLR
2.0000 mg | Freq: Once | INTRAVENOUS | Status: AC
  Administered 2015-07-11: 2 mg
  Filled 2015-07-11: qty 2

## 2015-07-11 NOTE — Progress Notes (Addendum)
PARENTERAL NUTRITION CONSULT NOTE - INITIAL  Pharmacy Consult for TPN Indication: prolonged malnutrition d/t colitis  Allergies  Allergen Reactions  . Codeine Nausea And Vomiting    Pt is unable to recall all reactions to codeine  . Meloxicam Other (See Comments)    Due to stage III kidney disease  . Penicillins     Has patient had a PCN reaction causing immediate rash, facial/tongue/throat swelling, SOB or lightheadedness with hypotension: unknown Has patient had a PCN reaction causing severe rash involving mucus membranes or skin necrosis: unknown Has patient had a PCN reaction that required hospitalization: yes, drs visit Has patient had a PCN reaction occurring within the last 10 years: yes If all of the above answers are "NO", then may proceed with Cephalosporin use.   Marland Kitchen Propoxyphene Hcl Nausea And Vomiting  . Tramadol Nausea Only    jittery    Patient Measurements: Height: 5' 3.5" (161.3 cm) Weight: 230 lb (104.327 kg) IBW/kg (Calculated) : 53.55 Adjusted Body Weight: 74 kg Usual Weight: 106 kg  Vital Signs: Temp: 97.6 F (36.4 C) (05/28 0500) Temp Source: Oral (05/28 0500) BP: 99/53 mmHg (05/28 0800) Pulse Rate: 93 (05/28 0800) Intake/Output from previous day: 05/27 0701 - 05/28 0700 In: 4020 [P.O.:1220; I.V.:2100; IV Piggyback:700] Out: 1450 [Urine:1450] Intake/Output from this shift: Total I/O In: 200 [IV Piggyback:200] Out: -   Labs:  Recent Labs  07/09/15 2017 07/10/15 0410 07/10/15 1555 07/11/15 0421  WBC 16.7* 18.0*  --  16.8*  HGB 7.1* 9.0* 8.9* 7.2*  HCT 21.2* 26.6* 26.4* 31.2*  PLT 426* 347  --  277     Recent Labs  07/09/15 1423 07/10/15 0410 07/11/15 0421  NA 133* 132* 135  K 3.5 3.3* 5.3*  CL 97* 101 108  CO2 GLUCOSE 206* 166* 172*  BUN 22* 18 16  CREATININE 1.54* 1.29* 1.18*  CALCIUM 7.4* 7.0* 7.3*  PROT 5.5*  --  3.8*  ALBUMIN 1.9*  --  1.3*  AST 41  --  26  ALT 22  --  18  ALKPHOS 58  --  42  BILITOT 1.4*   --  0.5   Estimated Creatinine Clearance: 53.2 mL/min (by C-G formula based on Cr of 1.18).   No results for input(s): GLUCAP in the last 72 hours.  Medical History: Past Medical History  Diagnosis Date  . Thyroid disease   . Hypercholesterolemia   . Arthritis   . GERD (gastroesophageal reflux disease)   . Diverticulitis   . Renal insufficiency     stage 3 kidney disease  . Sleep apnea     occasionally wears a c-pap  . Anxiety   . Osteoporosis   . Ulcerative colitis (HCC)    Insulin Requirements: not ordered  Current Nutrition: FLD; tolerating some broth, grits; no supplements ordered yet  IVF: none  Central access: PICC placed 5/28 TPN start date: 5/28  ASSESSMENT  HPI: 46 yoF with abd pain and diarrhea (sometimes bloody) x 1 month admitted 5/26 for symptomatic anemia.  Workup reveals infectious vs inflammatory colitis.  GI felt PO diet was inadequate and not planning to advance diet in immediate future, so Pharmacy consulted to start TPN.  Also noted with new AKI on admission secondary to dehydration from diarrhea.  Significant events:   Today:    Glucose - No Hx DM; CBGs elevated; no insulin ordered; on steroids for colitis  Electrolytes - K elevated, otherwise normal  Renal - AKI resolving  LFTs - wnl except albumin low; Tbili previously high now normalized  TGs - pending  Prealbumin - pending  NUTRITIONAL GOALS                                                                                             RD recs: pending  Preliminary calculations: 2220 kcal/day (using ABW); 89-104 g/day protein Estimate taking in 200-300 kcal/day from PO nutrition; no supplements ordered yet  Clinimix 5/15 at a goal rate of 80 ml/hr + 20% fat emulsion at 10 ml/hr to provide: 96 g/day protein, 1843 Kcal/day.  PLAN                                                                                                                           Hold potassium supplements with elevated K; consider resuming when appropriate  At 1800 today:  Start Clinimix 5/15 without lytes (d/t high K+) at 40 ml/hr.  20% fat emulsion at 10 ml/hr.  Plan to advance as tolerated to the goal rate.  TPN to contain standard multivitamins and trace elements.  Add mod SSI with q4 CBG checks. Qualifies for resistant SSI w/ obesity and steroids; will start conservatively with moderate and low threshold to escalate  TPN lab panels on Mondays & Thursdays.  F/u daily.  Bernadene Person, PharmD, BCPS Pager: [phone] 07/11/2015, 9:13 AM

## 2015-07-11 NOTE — Progress Notes (Signed)
Patient arrived to unit from ICU.  A&O x4.  Denies pain.  PICC line intact, blood infusing.  Patient reports BM today which was dark red in color.  Denies cough.

## 2015-07-11 NOTE — Progress Notes (Addendum)
[facility] Progress Note    Since last GI note: Bloody diarrhea, hematochezia continues. About the same pace as prior to admission per patient.  3 overnight as 3-4 during the day yesterday.  Picking at her clear liquid tray. No abd pain.  Reluctant to eat/drink because it causes GI activity.  Objective: Vital signs in last 24 hours: Temp:  [97.6 F (36.4 C)-98.8 F (37.1 C)] 97.6 F (36.4 C) (05/28 0500) Pulse Rate:  [75-99] 93 (05/28 0600) Resp:  [14-21] 19 (05/28 0600) BP: (80-131)/(20-57) 110/40 mmHg (05/28 0600) SpO2:  [94 %-99 %] 96 % (05/28 0600) Last BM Date: 07/10/15 General: alert and oriented times 3 Heart: regular rate and rythm Abdomen: soft, non-tender, non-distended, normal bowel sounds   Lab Results:  Recent Labs  07/09/15 1423 07/09/15 2017 07/10/15 0410 07/10/15 1555  WBC 18.4* 16.7* 18.0*  --   HGB 9.4* 7.1* 9.0* 8.9*  PLT 572* 426* 347  --   MCV 89.2 90.2 86.4  --     Recent Labs  07/09/15 1423 07/10/15 0410 07/11/15 0421  NA 133* 132* 135  K 3.5 3.3* 5.3*  CL 97* 101 108  CO2 27 25 22   GLUCOSE 206* 166* 172*  BUN 22* 18 16  CREATININE 1.54* 1.29* 1.18*  CALCIUM 7.4* 7.0* 7.3*    Recent Labs  07/09/15 1423 07/11/15 0421  PROT 5.5* 3.8*  ALBUMIN 1.9* 1.3*  AST 41 26  ALT 22 18  ALKPHOS 58 42  BILITOT 1.4* 0.5    Medications: Scheduled Meds: . ALPRAZolam  0.25 mg Oral Daily  . cholecalciferol  5,000 Units Oral q morning - 10a  . ciprofloxacin  400 mg Intravenous BID  . ferrous sulfate  325 mg Oral BID WC  . folic acid  1 mg Oral Daily  . levothyroxine  100 mcg Oral QAC breakfast  . methylPREDNISolone (SOLU-MEDROL) injection  20 mg Intravenous Q8H  . metronidazole  500 mg Intravenous Q8H  . ondansetron  4 mg Intravenous BID  . pantoprazole  40 mg Oral Daily  . potassium chloride SA  20 mEq Oral TID  . simvastatin  20 mg Oral q1800   Continuous Infusions:  PRN Meds:.acetaminophen **OR** acetaminophen,  albuterol, morphine injection, ondansetron **OR** ondansetron (ZOFRAN) IV, oxyCODONE, traZODone    Assessment/Plan: 70 y.o. female with severe, fulminant colitis (infectious vs. Inflammatory)  Later today will be 48 hours of IV steroids. C. Diff was neg and gi pathogen panel was neg.  Will continue cipro/flagyl IV another 24 hours and then consider d/c.  Her alb was quite low and she is not eating well now, will start TNA with pharmacy aid.  She needs cbc this morning, will order.  Addendum: Hb this AB 7.2; I've ordered 2 unit blood transfusion    Rachael Fee, MD  07/11/2015, 7:21 AM [facility] Pager [phone]

## 2015-07-11 NOTE — Progress Notes (Signed)
[address] [place], [place] [address] Phone: [phone] FAX: [place] [phone] 1945/09/09  CARE TEAM:  PCP: Vidal Schwalbe, MD  Outpatient Care Team: Patient Care Team: Harlan Stains, MD as PCP - General (Family Medicine) Manus Gunning, MD as Consulting Physician (Gastroenterology)  Inpatient Treatment Team: Treatment Team: Attending Provider: Charlynne Cousins, MD; Consulting Physician: Milus Banister, MD; Registered Nurse: Scarlett Presto Pinnix, RN; Consulting Physician: Nolon Nations, MD; Rounding Team: Garner Gavel, MD; Registered Nurse: Tamsen Roers, RN; Technician: Linton Rump, NT  Problem List:   Principal Problem:   Colitis, nonspecific Active Problems:   Anxiety state   GERD   Diverticulosis of large intestine   AKI (acute kidney injury) ([place])   Anemia due to GI blood loss   Diarrhea in adult patient   Hypovolemia dehydration   Absolute anemia   Acute lower GI bleeding      * No surgery found *     Assessment  Colitis with bleeding and diarrhea.  Some persistent hematochezia and diarrhea but he would have clean much more stable.  Plan:  -Advance diet as tolerated.  IV antibiotics.  IV steroids.  Continue workup for etiology of colitis.  The patient is stable.  There is no evidence of peritonitis, acute abdomen, nor shock.  There is no strong evidence of failure of improvement nor decline with current non-operative management.  There is no need for surgery at the present moment.  We will continue to follow more peripherally.  Please call if need to be urgently reevaluated.  -VTE prophylaxis- SCDs, etc -mobilize as tolerated to help recovery  Adin Hector, M.D., F.A.C.S. Gastrointestinal and Minimally Invasive Surgery [facility] [address], [place], [place] [address] [phone] Main /  Paging   07/11/2015  Subjective:  Tired.  Friend at bedside.  Stepdown nurse at bedside.  No nausea or vomiting.  No abdominal pain.  Still has some diarrhea with a little bit of blood.  Objective:  Vital signs:  Filed Vitals:   07/11/15 0500 07/11/15 0600 07/11/15 0700 07/11/15 0800  BP: 111/42 110/40 98/60 99/53   Pulse: 75 93 91 93  Temp: 97.6 F (36.4 C)     TempSrc: Oral     Resp: 19 19 15 18   Height:      Weight:      SpO2: 96% 96% 97% 98%    Last BM Date: 07/11/15  Intake/Output   Yesterday:  05/27 0701 - 05/28 0700 In: 3254 [P.O.:1220; I.V.:2100; IV Piggyback:700] Out: 1450 [Urine:1450] This shift:  Total I/O In: 200 [IV Piggyback:200] Out: -   Bowel function:  Flatus: YES  BM:  YES  Drain: (No drain)   Physical Exam:  General: Pt awake/alert/oriented x4 in No acute distress Eyes: PERRL, normal EOM.  Sclera clear.  No icterus Neuro: CN II-XII intact w/o focal sensory/motor deficits. Lymph: No head/neck/groin lymphadenopathy Psych:  No delerium/psychosis/paranoia HENT: Normocephalic, Mucus membranes moist.  No thrush Neck: Supple, No tracheal deviation Chest: No chest wall pain w good excursion CV:  Pulses intact.  Regular rhythm MS: Normal AROM mjr joints.  No obvious deformity Abdomen: Soft.  Nondistended.  Nontender.  Obese.  No evidence of peritonitis.  No incarcerated hernias. Ext:  SCDs BLE.  No mjr edema.  No cyanosis Skin: No petechiae / purpura  Results:   Labs: Results for orders placed or performed during the  hospital encounter of 07/09/15 (from the past 48 hour(s))  MRSA PCR Screening     Status: None   Collection Time: 07/09/15  1:36 PM  Result Value Ref Range   MRSA by PCR NEGATIVE NEGATIVE    Comment:        The GeneXpert MRSA Assay (FDA approved for NASAL specimens only), is one component of a comprehensive MRSA colonization surveillance program. It is not intended to diagnose MRSA infection nor to guide  or monitor treatment for MRSA infections.   Lipase, blood     Status: None   Collection Time: 07/09/15  2:23 PM  Result Value Ref Range   Lipase 34 11 - 51 U/L  Comprehensive metabolic panel     Status: Abnormal   Collection Time: 07/09/15  2:23 PM  Result Value Ref Range   Sodium 133 (L) 135 - 145 mmol/L   Potassium 3.5 3.5 - 5.1 mmol/L   Chloride 97 (L) 101 - 111 mmol/L   CO2 27 22 - 32 mmol/L   Glucose, Bld 206 (H) 65 - 99 mg/dL   BUN 22 (H) 6 - 20 mg/dL   Creatinine, Ser 1.54 (H) 0.44 - 1.00 mg/dL   Calcium 7.4 (L) 8.9 - 10.3 mg/dL   Total Protein 5.5 (L) 6.5 - 8.1 g/dL   Albumin 1.9 (L) 3.5 - 5.0 g/dL   AST 41 15 - 41 U/L   ALT 22 14 - 54 U/L   Alkaline Phosphatase 58 38 - 126 U/L   Total Bilirubin 1.4 (H) 0.3 - 1.2 mg/dL   GFR calc non Af Amer 34 (L) >60 mL/min   GFR calc Af Amer 39 (L) >60 mL/min    Comment: (NOTE) The eGFR has been calculated using the CKD EPI equation. This calculation has not been validated in all clinical situations. eGFR's persistently <60 mL/min signify possible Chronic Kidney Disease.    Anion gap 9 5 - 15  CBC     Status: Abnormal   Collection Time: 07/09/15  2:23 PM  Result Value Ref Range   WBC 18.4 (H) 4.0 - 10.5 K/uL   RBC 3.05 (L) 3.87 - 5.11 MIL/uL   Hemoglobin 9.4 (L) 12.0 - 15.0 g/dL   HCT 27.2 (L) 36.0 - 46.0 %   MCV 89.2 78.0 - 100.0 fL   MCH 30.8 26.0 - 34.0 pg   MCHC 34.6 30.0 - 36.0 g/dL   RDW 15.2 11.5 - 15.5 %   Platelets 572 (H) 150 - 400 K/uL  CBC     Status: Abnormal   Collection Time: 07/09/15  8:17 PM  Result Value Ref Range   WBC 16.7 (H) 4.0 - 10.5 K/uL   RBC 2.35 (L) 3.87 - 5.11 MIL/uL   Hemoglobin 7.1 (L) 12.0 - 15.0 g/dL    Comment: DELTA CHECK NOTED REPEATED TO VERIFY    HCT 21.2 (L) 36.0 - 46.0 %   MCV 90.2 78.0 - 100.0 fL   MCH 30.2 26.0 - 34.0 pg   MCHC 33.5 30.0 - 36.0 g/dL   RDW 15.3 11.5 - 15.5 %   Platelets 426 (H) 150 - 400 K/uL  Type and screen [place]     Status:  None (Preliminary result)   Collection Time: 07/09/15  8:17 PM  Result Value Ref Range   ABO/RH(D) A POS    Antibody Screen NEG    Sample Expiration 07/12/2015    Unit Number [id number]    Blood Component Type RED CELLS,LR  Unit division 00    Status of Unit ISSUED    Transfusion Status OK TO TRANSFUSE    Crossmatch Result Compatible    Unit Number [id number]    Blood Component Type RED CELLS,LR    Unit division 00    Status of Unit ISSUED    Transfusion Status OK TO TRANSFUSE    Crossmatch Result Compatible    Unit Number [id number]    Blood Component Type RED CELLS,LR    Unit division 00    Status of Unit ALLOCATED    Transfusion Status OK TO TRANSFUSE    Crossmatch Result Compatible    Unit Number [id number]    Blood Component Type RED CELLS,LR    Unit division 00    Status of Unit ALLOCATED    Transfusion Status OK TO TRANSFUSE    Crossmatch Result Compatible   ABO/Rh     Status: None   Collection Time: 07/09/15  8:17 PM  Result Value Ref Range   ABO/RH(D) A POS   Prepare RBC     Status: None   Collection Time: 07/09/15  8:28 PM  Result Value Ref Range   Order Confirmation ORDER PROCESSED BY BLOOD BANK   Prepare RBC     Status: None   Collection Time: 07/10/15  1:30 AM  Result Value Ref Range   Order Confirmation ORDER PROCESSED BY BLOOD BANK   C difficile quick scan w PCR reflex     Status: None   Collection Time: 07/10/15  2:08 AM  Result Value Ref Range   C Diff antigen NEGATIVE NEGATIVE   C Diff toxin NEGATIVE NEGATIVE   C Diff interpretation Negative for toxigenic C. difficile   Gastrointestinal Panel by PCR , Stool     Status: None   Collection Time: 07/10/15  2:08 AM  Result Value Ref Range   Campylobacter species NOT DETECTED NOT DETECTED   Plesimonas shigelloides NOT DETECTED NOT DETECTED   Salmonella species NOT DETECTED NOT DETECTED   Yersinia enterocolitica NOT DETECTED NOT DETECTED   Vibrio species NOT DETECTED NOT DETECTED    Vibrio cholerae NOT DETECTED NOT DETECTED   Enteroaggregative E coli (EAEC) NOT DETECTED NOT DETECTED   Enteropathogenic E coli (EPEC) NOT DETECTED NOT DETECTED   Enterotoxigenic E coli (ETEC) NOT DETECTED NOT DETECTED   Shiga like toxin producing E coli (STEC) NOT DETECTED NOT DETECTED   E. coli O157 NOT DETECTED NOT DETECTED   Shigella/Enteroinvasive E coli (EIEC) NOT DETECTED NOT DETECTED   Cryptosporidium NOT DETECTED NOT DETECTED   Cyclospora cayetanensis NOT DETECTED NOT DETECTED   Entamoeba histolytica NOT DETECTED NOT DETECTED   Giardia lamblia NOT DETECTED NOT DETECTED   Adenovirus F40/41 NOT DETECTED NOT DETECTED   Astrovirus NOT DETECTED NOT DETECTED   Norovirus GI/GII NOT DETECTED NOT DETECTED   Rotavirus A NOT DETECTED NOT DETECTED   Sapovirus (I, II, IV, and V) NOT DETECTED NOT DETECTED  Basic metabolic panel     Status: Abnormal   Collection Time: 07/10/15  4:10 AM  Result Value Ref Range   Sodium 132 (L) 135 - 145 mmol/L   Potassium 3.3 (L) 3.5 - 5.1 mmol/L   Chloride 101 101 - 111 mmol/L   CO2 25 22 - 32 mmol/L   Glucose, Bld 166 (H) 65 - 99 mg/dL   BUN 18 6 - 20 mg/dL   Creatinine, Ser 1.29 (H) 0.44 - 1.00 mg/dL   Calcium 7.0 (L) 8.9 - 10.3 mg/dL   GFR calc  non Af Amer 42 (L) >60 mL/min   GFR calc Af Amer 48 (L) >60 mL/min    Comment: (NOTE) The eGFR has been calculated using the CKD EPI equation. This calculation has not been validated in all clinical situations. eGFR's persistently <60 mL/min signify possible Chronic Kidney Disease.    Anion gap 6 5 - 15  CBC     Status: Abnormal   Collection Time: 07/10/15  4:10 AM  Result Value Ref Range   WBC 18.0 (H) 4.0 - 10.5 K/uL   RBC 3.08 (L) 3.87 - 5.11 MIL/uL   Hemoglobin 9.0 (L) 12.0 - 15.0 g/dL    Comment: RESULT REPEATED AND VERIFIED DELTA CHECK NOTED POST TRANSFUSION SPECIMEN    HCT 26.6 (L) 36.0 - 46.0 %   MCV 86.4 78.0 - 100.0 fL   MCH 29.2 26.0 - 34.0 pg   MCHC 33.8 30.0 - 36.0 g/dL   RDW 14.7  11.5 - 15.5 %   Platelets 347 150 - 400 K/uL  Hemoglobin and hematocrit, blood     Status: Abnormal   Collection Time: 07/10/15  3:55 PM  Result Value Ref Range   Hemoglobin 8.9 (L) 12.0 - 15.0 g/dL   HCT 26.4 (L) 36.0 - 46.0 %  Urinalysis, Routine w reflex microscopic (not at [facility])     Status: Abnormal   Collection Time: 07/10/15  8:16 PM  Result Value Ref Range   Color, Urine AMBER (A) YELLOW    Comment: BIOCHEMICALS MAY BE AFFECTED BY COLOR   APPearance CLOUDY (A) CLEAR   Specific Gravity, Urine 1.018 1.005 - 1.030   pH 6.0 5.0 - 8.0   Glucose, UA NEGATIVE NEGATIVE mg/dL   Hgb urine dipstick NEGATIVE NEGATIVE   Bilirubin Urine NEGATIVE NEGATIVE   Ketones, ur NEGATIVE NEGATIVE mg/dL   Protein, ur NEGATIVE NEGATIVE mg/dL   Nitrite NEGATIVE NEGATIVE   Leukocytes, UA NEGATIVE NEGATIVE    Comment: MICROSCOPIC NOT DONE ON URINES WITH NEGATIVE PROTEIN, BLOOD, LEUKOCYTES, NITRITE, OR GLUCOSE <1000 mg/dL.  Comprehensive metabolic panel     Status: Abnormal   Collection Time: 07/11/15  4:21 AM  Result Value Ref Range   Sodium 135 135 - 145 mmol/L   Potassium 5.3 (H) 3.5 - 5.1 mmol/L    Comment: QUANTITY NOT SUFFICIENT TO REPEAT TEST NO VISIBLE HEMOLYSIS DELTA CHECK NOTED    Chloride 108 101 - 111 mmol/L   CO2 22 22 - 32 mmol/L   Glucose, Bld 172 (H) 65 - 99 mg/dL   BUN 16 6 - 20 mg/dL   Creatinine, Ser 1.18 (H) 0.44 - 1.00 mg/dL   Calcium 7.3 (L) 8.9 - 10.3 mg/dL   Total Protein 3.8 (L) 6.5 - 8.1 g/dL   Albumin 1.3 (L) 3.5 - 5.0 g/dL   AST 26 15 - 41 U/L   ALT 18 14 - 54 U/L   Alkaline Phosphatase 42 38 - 126 U/L   Total Bilirubin 0.5 0.3 - 1.2 mg/dL   GFR calc non Af Amer 46 (L) >60 mL/min   GFR calc Af Amer 54 (L) >60 mL/min    Comment: (NOTE) The eGFR has been calculated using the CKD EPI equation. This calculation has not been validated in all clinical situations. eGFR's persistently <60 mL/min signify possible Chronic Kidney Disease.    Anion gap 5 5 - 15  CBC      Status: Abnormal   Collection Time: 07/11/15  4:21 AM  Result Value Ref Range   WBC 16.8 (H)  4.0 - 10.5 K/uL   RBC 3.55 (L) 3.87 - 5.11 MIL/uL   Hemoglobin 7.2 (L) 12.0 - 15.0 g/dL   HCT 31.2 (L) 36.0 - 46.0 %   MCV 87.9 78.0 - 100.0 fL   MCH 20.3 (L) 26.0 - 34.0 pg   MCHC 23.1 (L) 30.0 - 36.0 g/dL   RDW 15.6 (H) 11.5 - 15.5 %   Platelets 277 150 - 400 K/uL  Magnesium     Status: None   Collection Time: 07/11/15  4:21 AM  Result Value Ref Range   Magnesium 2.1 1.7 - 2.4 mg/dL  Phosphorus     Status: None   Collection Time: 07/11/15  4:21 AM  Result Value Ref Range   Phosphorus 3.3 2.5 - 4.6 mg/dL    Imaging / Studies: Ct Abdomen Pelvis Wo Contrast  07/10/2015  CLINICAL DATA:  Acute onset of GI bleeding.  Initial encounter. EXAM: CT ABDOMEN AND PELVIS WITHOUT CONTRAST TECHNIQUE: Multidetector CT imaging of the abdomen and pelvis was performed following the standard protocol without IV contrast. COMPARISON:  CT of the abdomen and pelvis from 06/25/2015 FINDINGS: Trace right-sided pleural fluid is noted, with associated atelectasis. The liver and spleen are unremarkable in appearance. The gallbladder is within normal limits. The pancreas and adrenal glands are unremarkable. Scattered bilateral parapelvic renal cysts are seen. Minimal nonspecific perinephric stranding is noted bilaterally. There is no evidence of hydronephrosis. No renal or ureteral stones are identified. The small bowel is unremarkable in appearance. The stomach is within normal limits. No acute vascular abnormalities are seen. The patient is status post appendectomy. Vague soft tissue inflammation is noted along the ascending, descending and proximal sigmoid colon, concerning for acute infectious or inflammatory colitis. Underlying diverticulosis is noted along the transverse, descending and sigmoid colon. Trace fluid is seen tracking along the paracolic gutters. The bladder is mildly distended and grossly unremarkable. The  patient is status post hysterectomy. The ovaries are grossly symmetric. No suspicious adnexal masses are seen. No inguinal lymphadenopathy is seen. No acute osseous abnormalities are identified. IMPRESSION: 1. Vague soft tissue inflammation along the ascending, descending and proximal sigmoid colon, concerning for acute infectious or inflammatory colitis. Trace fluid tracking along the paracolic gutters. 2. Underlying diverticulosis along the transverse, descending and sigmoid colon. 3. Scattered bilateral parapelvic renal cysts again noted. 4. Trace right-sided pleural fluid, with associated atelectasis. Electronically Signed   By: Garald Balding M.D.   On: 07/10/2015 00:00    Medications / Allergies: per chart  Antibiotics: Anti-infectives    Start     Dose/Rate Route Frequency Ordered Stop   07/10/15 0330  metroNIDAZOLE (FLAGYL) IVPB 500 mg     500 mg 100 mL/hr over 60 Minutes Intravenous Every 8 hours 07/10/15 0241     07/10/15 0300  ciprofloxacin (CIPRO) IVPB 400 mg     400 mg 200 mL/hr over 60 Minutes Intravenous 2 times daily 07/10/15 0241          Note: Portions of this report may have been transcribed using voice recognition software. Every effort was made to ensure accuracy; however, inadvertent computerized transcription errors may be present.   Any transcriptional errors that result from this process are unintentional.     Adin Hector, M.D., F.A.C.S. Gastrointestinal and Minimally Invasive Surgery [facility] [address], [place], [place] [address] [phone] Main / Paging   07/11/2015

## 2015-07-11 NOTE — Progress Notes (Signed)
[facility] PROGRESS NOTE    Progress Note  Jamie Wood  ZOX:[id number] DOB: 1945-05-31 DOA: 07/09/2015 PCP: Vidal Schwalbe, MD     Brief Narrative:   Jamie Wood is an 70 y.o. female past medical history no GI discomfort with a recent endoscopy and 07/03/2015 that was congestive of possible inflammatory colitis however biopsies are not revealed inflammation, patient was treated empirically with mesalamine steroids, and previous GI pathogen was unremarkable the comes into the ED complaining of abdominal pain and bloody diarrhea for a month which has progressively gotten worse now she feels dizzy and lightheadedness, and ED was found to have a drop in the hemoglobin mild increase in her creatinine.  Assessment/Plan:   Colitis, nonspecific/  Diarrhea in adult patient: Unclear weather if nfectious versus inflammatory,leaning more towards inflamatory GI pathogens negative, C. difficile, is negative. ESR is elevated, appreciate GI assistance, cont IV steroids and antibiotics. Continue IV fluids and Zofran. KVO IV fluids, start enteral nutrition  Acute kidney injury/orthostatic hypotension: Likely due to pre-renal etiology, she was started on aggressive IV fluid, now Cr at baseline, KVO IV fluids  Hyponatremia: Likely due to hypovolemia continue IV fluid hydration. resolved  Normocytic anemia/anemia due to GI losses: Her hemoglobin has dropped from 14 last month to 9.4 to  on admission to 7.1, received 2 unit of PRBC Now 8.9 will cont to monitor.  Moderate protein caloric malnutrition: Albumin is 1.3, have encourage her to eat.  DVT prophylaxis: SCD's Family Communication:Husband Disposition Plan/Barrier to D/C: transfer to SDU Code Status:     Code Status Orders        Start     Ordered   07/09/15 1736  Full code   Continuous     07/09/15 1735    Code Status History    Date Active Date Inactive Code Status Order ID Comments User Context   This patient has a  current code status but no historical code status.    Advance Directive Documentation        Most Recent Value   Type of Advance Directive  Living will   Pre-existing out of facility DNR order (yellow form or pink MOST form)     "MOST" Form in Place?          IV Access:    Peripheral IV   Procedures and diagnostic studies:   Ct Abdomen Pelvis Wo Contrast  07/10/2015  CLINICAL DATA:  Acute onset of GI bleeding.  Initial encounter. EXAM: CT ABDOMEN AND PELVIS WITHOUT CONTRAST TECHNIQUE: Multidetector CT imaging of the abdomen and pelvis was performed following the standard protocol without IV contrast. COMPARISON:  CT of the abdomen and pelvis from 06/25/2015 FINDINGS: Trace right-sided pleural fluid is noted, with associated atelectasis. The liver and spleen are unremarkable in appearance. The gallbladder is within normal limits. The pancreas and adrenal glands are unremarkable. Scattered bilateral parapelvic renal cysts are seen. Minimal nonspecific perinephric stranding is noted bilaterally. There is no evidence of hydronephrosis. No renal or ureteral stones are identified. The small bowel is unremarkable in appearance. The stomach is within normal limits. No acute vascular abnormalities are seen. The patient is status post appendectomy. Vague soft tissue inflammation is noted along the ascending, descending and proximal sigmoid colon, concerning for acute infectious or inflammatory colitis. Underlying diverticulosis is noted along the transverse, descending and sigmoid colon. Trace fluid is seen tracking along the paracolic gutters. The bladder is mildly distended and grossly unremarkable. The patient is status post hysterectomy. The  ovaries are grossly symmetric. No suspicious adnexal masses are seen. No inguinal lymphadenopathy is seen. No acute osseous abnormalities are identified. IMPRESSION: 1. Vague soft tissue inflammation along the ascending, descending and proximal sigmoid colon,  concerning for acute infectious or inflammatory colitis. Trace fluid tracking along the paracolic gutters. 2. Underlying diverticulosis along the transverse, descending and sigmoid colon. 3. Scattered bilateral parapelvic renal cysts again noted. 4. Trace right-sided pleural fluid, with associated atelectasis. Electronically Signed   By: Garald Balding M.D.   On: 07/10/2015 00:00     Medical Consultants:    None.  Anti-Infectives:   Cipro/Flagyl 5.26.2017  Subjective:    Jamie Wood she relates she feels about the same continues to have bloody bowel movement, 4 overnight  Objective:    Filed Vitals:   07/11/15 0300 07/11/15 0400 07/11/15 0500 07/11/15 0600  BP: 113/49 121/46 111/42 110/40  Pulse: 81 75 75 93  Temp:   97.6 F (36.4 C)   TempSrc:   Oral   Resp: _0 Height:      Weight:      SpO2: 94% 97% 96% 96%    Intake/Output Summary (Last 24 hours) at 07/11/15 0727 Last data filed at 07/11/15 0600  Gross per 24 hour  Intake   4020 ml  Output   1450 ml  Net   2570 ml   Filed Weights   07/09/15 1338  Weight: 104.327 kg (230 lb)    Exam: General exam: In no acute distress. Respiratory system: Good air movement and clear to auscultation. Cardiovascular system: S1 & S2 heard, RRR.  Gastrointestinal system: Abdomen is nondistended, soft and nontender.  Central nervous system: Alert and oriented. No focal neurological deficits. Extremities: No pedal edema. Skin: No rashes, lesions or ulcers Psychiatry: Judgement and insight appear normal. Mood & affect appropriate.    Data Reviewed:    Labs: Basic Metabolic Panel:  Recent Labs Lab 07/06/15 1133 07/09/15 1423 07/10/15 0410 07/11/15 0421  NA 135 133* 132* 135  K 2.9* 3.5 3.3* 5.3*  CL 93* 97* 101 108  CO2 _1 GLUCOSE 147* 206* 166* 172*  BUN 21 22* 18 16  CREATININE 1.51* 1.54* 1.29* 1.18*  CALCIUM 7.3* 7.4* 7.0* 7.3*   GFR Estimated Creatinine Clearance: 53.2 mL/min (by C-G  formula based on Cr of 1.18). Liver Function Tests:  Recent Labs Lab 07/06/15 1133 07/09/15 1423 07/11/15 0421  AST 26 41 26  ALT _2 ALKPHOS 46 58 42  BILITOT 0.4 1.4* 0.5  PROT 5.1* 5.5* 3.8*  ALBUMIN 2.3* 1.9* 1.3*    Recent Labs Lab 07/09/15 1423  LIPASE 34   No results for input(s): AMMONIA in the last 168 hours. Coagulation profile No results for input(s): INR, PROTIME in the last 168 hours.  CBC:  Recent Labs Lab 07/06/15 1133 07/09/15 1423 07/09/15 2017 07/10/15 0410 07/10/15 1555  WBC 10.3 18.4* 16.7* 18.0*  --   NEUTROABS 8.2*  --   --   --   --   HGB 11.6* 9.4* 7.1* 9.0* 8.9*  HCT 34.6* 27.2* 21.2* 26.6* 26.4*  MCV 89.8 89.2 90.2 86.4  --   PLT 460.0* 572* 426* 347  --    Cardiac Enzymes: No results for input(s): CKTOTAL, CKMB, CKMBINDEX, TROPONINI in the last 168 hours. BNP (last 3 results) No results for input(s): PROBNP in the last 8760 hours. CBG: No results for input(s): GLUCAP in the last 168 hours. D-Dimer:  No results for input(s): DDIMER in the last 72 hours. Hgb A1c: No results for input(s): HGBA1C in the last 72 hours. Lipid Profile: No results for input(s): CHOL, HDL, LDLCALC, TRIG, CHOLHDL, LDLDIRECT in the last 72 hours. Thyroid function studies: No results for input(s): TSH, T4TOTAL, T3FREE, THYROIDAB in the last 72 hours.  Invalid input(s): FREET3 Anemia work up: No results for input(s): VITAMINB12, FOLATE, FERRITIN, TIBC, IRON, RETICCTPCT in the last 72 hours. Sepsis Labs:  Recent Labs Lab 07/06/15 1133 07/09/15 1423 07/09/15 2017 07/10/15 0410  WBC 10.3 18.4* 16.7* 18.0*   Microbiology Recent Results (from the past 240 hour(s))  MRSA PCR Screening     Status: None   Collection Time: 07/09/15  1:36 PM  Result Value Ref Range Status   MRSA by PCR NEGATIVE NEGATIVE Final    Comment:        The GeneXpert MRSA Assay (FDA approved for NASAL specimens only), is one component of a comprehensive MRSA  colonization surveillance program. It is not intended to diagnose MRSA infection nor to guide or monitor treatment for MRSA infections.   C difficile quick scan w PCR reflex     Status: None   Collection Time: 07/10/15  2:08 AM  Result Value Ref Range Status   C Diff antigen NEGATIVE NEGATIVE Final   C Diff toxin NEGATIVE NEGATIVE Final   C Diff interpretation Negative for toxigenic C. difficile  Final  Gastrointestinal Panel by PCR , Stool     Status: None   Collection Time: 07/10/15  2:08 AM  Result Value Ref Range Status   Campylobacter species NOT DETECTED NOT DETECTED Final   Plesimonas shigelloides NOT DETECTED NOT DETECTED Final   Salmonella species NOT DETECTED NOT DETECTED Final   Yersinia enterocolitica NOT DETECTED NOT DETECTED Final   Vibrio species NOT DETECTED NOT DETECTED Final   Vibrio cholerae NOT DETECTED NOT DETECTED Final   Enteroaggregative E coli (EAEC) NOT DETECTED NOT DETECTED Final   Enteropathogenic E coli (EPEC) NOT DETECTED NOT DETECTED Final   Enterotoxigenic E coli (ETEC) NOT DETECTED NOT DETECTED Final   Shiga like toxin producing E coli (STEC) NOT DETECTED NOT DETECTED Final   E. coli O157 NOT DETECTED NOT DETECTED Final   Shigella/Enteroinvasive E coli (EIEC) NOT DETECTED NOT DETECTED Final   Cryptosporidium NOT DETECTED NOT DETECTED Final   Cyclospora cayetanensis NOT DETECTED NOT DETECTED Final   Entamoeba histolytica NOT DETECTED NOT DETECTED Final   Giardia lamblia NOT DETECTED NOT DETECTED Final   Adenovirus F40/41 NOT DETECTED NOT DETECTED Final   Astrovirus NOT DETECTED NOT DETECTED Final   Norovirus GI/GII NOT DETECTED NOT DETECTED Final   Rotavirus A NOT DETECTED NOT DETECTED Final   Sapovirus (I, II, IV, and V) NOT DETECTED NOT DETECTED Final     Medications:   . ALPRAZolam  0.25 mg Oral Daily  . cholecalciferol  5,000 Units Oral q morning - 10a  . ciprofloxacin  400 mg Intravenous BID  . ferrous sulfate  325 mg Oral BID WC  .  folic acid  1 mg Oral Daily  . levothyroxine  100 mcg Oral QAC breakfast  . methylPREDNISolone (SOLU-MEDROL) injection  20 mg Intravenous Q8H  . metronidazole  500 mg Intravenous Q8H  . ondansetron  4 mg Intravenous BID  . pantoprazole  40 mg Oral Daily  . potassium chloride SA  20 mEq Oral TID  . simvastatin  20 mg Oral q1800   Continuous Infusions:    Time spent:  25 min   LOS: 2 days   Charlynne Cousins  [facility] Pager [phone]  *Please refer to [email or web address], password [email or web address] to get updated schedule on who will round on this patient, as hospitalists switch teams weekly. If 7PM-7AM, please contact night-coverage at [email or web address], password [email or web address] for any overnight needs.  07/11/2015, 7:27 AM

## 2015-07-11 NOTE — Progress Notes (Signed)
Peripherally Inserted Central Catheter/Midline Placement  The IV Nurse has discussed with the patient and/or persons authorized to consent for the patient, the purpose of this procedure and the potential benefits and risks involved with this procedure.  The benefits include less needle sticks, lab draws from the catheter and patient may be discharged home with the catheter.  Risks include, but not limited to, infection, bleeding, blood clot (thrombus formation), and puncture of an artery; nerve damage and irregular heat beat.  Alternatives to this procedure were also discussed.  PICC/Midline Placement Documentation  PICC Triple Lumen 07/11/15 PICC Right Brachial 38 cm 0 cm (Active)  Indication for Insertion or Continuance of Line Administration of hyperosmolar/irritating solutions (i.e. TPN, Vancomycin, etc.) 07/11/2015  9:24 AM  Exposed Catheter (cm) 0 cm 07/11/2015  9:24 AM  Site Assessment Clean;Dry;Intact 07/11/2015  9:24 AM  Lumen #1 Status Flushed;Saline locked;Blood return noted 07/11/2015  9:24 AM  Lumen #2 Status Flushed;Saline locked;Blood return noted 07/11/2015  9:24 AM  Lumen #3 Status Flushed;Saline locked;Blood return noted 07/11/2015  9:24 AM  Dressing Type Occlusive 07/11/2015  9:24 AM  Dressing Status Clean;Dry;Intact 07/11/2015  9:24 AM  Dressing Change Due 07/13/15 07/11/2015  9:24 AM       Jamie Wood 07/11/2015, 9:25 AM

## 2015-07-11 NOTE — Progress Notes (Signed)
Initial Nutrition Assessment  DOCUMENTATION CODES:   Morbid obesity  INTERVENTION:   TPN per Pharmacy RD to continue to monitor PO intake and need for supplements  NUTRITION DIAGNOSIS:   Inadequate oral intake related to poor appetite as evidenced by per patient/family report.  GOAL:   Patient will meet greater than or equal to 90% of their needs  MONITOR:   PO intake, Labs, Weight trends, I & O's, Other (Comment) (TPN)  REASON FOR ASSESSMENT:   Consult New TPN/TNA  ASSESSMENT:   70 y.o. female past medical history no GI discomfort with a recent endoscopy and 07/03/2015 that was congestive of possible inflammatory colitis however biopsies are not revealed inflammation, patient was treated empirically with mesalamine steroids, and previous GI pathogen was unremarkable the comes into the ED complaining of abdominal pain and bloody diarrhea for a month which has progressively gotten worse now she feels dizzy and lightheadedness, and ED was found to have a drop in the hemoglobin mild increase in her creatinine.  Patient in room with visitor at bedside. Per pt, she has been eating poorly for over a week now. She was following a BRAT diet for 1 week then was unable to tolerate any foods after that. Currently pt is on a full liquid diet. She reports eating cream of chicken soup and grits for breakfast (163 kcal, 3g protein) and cream of chicken soup and sherbet today for lunch ( 176 kcal, 4g protein). She states she continue to have no appetite. Per chart review, she has been eating 50-60% of her meals since admission.  TPN to begin tonight per surgery. Patient reports 6 lb of weight loss. Per weight history, pt has lost 4 lb since 5/12 (2% wt loss x 2 weeks, significant for time frame).  Medications: vitamin D tablet daily, Ferrous sulfate tablet daily, IV Zofran BID Labs reviewed: Elevated K Mg/Phos WNL  Diet Order:  Diet full liquid Room service appropriate?: Yes; Fluid  consistency:: Thin TPN (CLINIMIX) Adult without lytes  Skin:  Reviewed, no issues  Last BM:  5/28  Height:   Ht Readings from Last 1 Encounters:  07/09/15 5' 3.5" (1.613 m)    Weight:   Wt Readings from Last 1 Encounters:  07/09/15 230 lb (104.327 kg)    Ideal Body Weight:  54.5 kg  BMI:  Body mass index is 40.1 kg/(m^2).  Estimated Nutritional Needs:   Kcal:  2000-2200  Protein:  80-90g  Fluid:  2L/day  EDUCATION NEEDS:   No education needs identified at this time  Tilda Franco, MS, RD, LDN Pager: [phone] After Hours Pager: [phone]

## 2015-07-12 LAB — TYPE AND SCREEN
ABO/RH(D): A POS
Antibody Screen: NEGATIVE
UNIT DIVISION: 0
UNIT DIVISION: 0
UNIT DIVISION: 0
Unit division: 0

## 2015-07-12 LAB — GLUCOSE, CAPILLARY
GLUCOSE-CAPILLARY: 173 mg/dL — AB (ref 65–99)
GLUCOSE-CAPILLARY: 182 mg/dL — AB (ref 65–99)
GLUCOSE-CAPILLARY: 235 mg/dL — AB (ref 65–99)
GLUCOSE-CAPILLARY: 227 mg/dL — AB (ref 65–99)
Glucose-Capillary: 149 mg/dL — ABNORMAL HIGH (ref 65–99)
Glucose-Capillary: 203 mg/dL — ABNORMAL HIGH (ref 65–99)
Glucose-Capillary: 216 mg/dL — ABNORMAL HIGH (ref 65–99)

## 2015-07-12 LAB — COMPREHENSIVE METABOLIC PANEL
ALBUMIN: 1.7 g/dL — AB (ref 3.5–5.0)
ALK PHOS: 55 U/L (ref 38–126)
ALT: 21 U/L (ref 14–54)
ANION GAP: 6 (ref 5–15)
AST: 22 U/L (ref 15–41)
BILIRUBIN TOTAL: 0.3 mg/dL (ref 0.3–1.2)
BUN: 17 mg/dL (ref 6–20)
CALCIUM: 7.6 mg/dL — AB (ref 8.9–10.3)
CO2: 23 mmol/L (ref 22–32)
Chloride: 106 mmol/L (ref 101–111)
Creatinine, Ser: 1.23 mg/dL — ABNORMAL HIGH (ref 0.44–1.00)
GFR calc Af Amer: 51 mL/min — ABNORMAL LOW (ref >60)
GFR calc non Af Amer: 44 mL/min — ABNORMAL LOW (ref >60)
GLUCOSE: 201 mg/dL — AB (ref 65–99)
Potassium: 4.1 mmol/L (ref 3.5–5.1)
Sodium: 135 mmol/L (ref 135–145)
TOTAL PROTEIN: 4.6 g/dL — AB (ref 6.5–8.1)

## 2015-07-12 LAB — MAGNESIUM: Magnesium: 2 mg/dL (ref 1.7–2.4)

## 2015-07-12 LAB — PHOSPHORUS: PHOSPHORUS: 3 mg/dL (ref 2.5–4.6)

## 2015-07-12 LAB — PREALBUMIN: Prealbumin: 5.8 mg/dL — ABNORMAL LOW (ref 18–38)

## 2015-07-12 LAB — TRIGLYCERIDES: Triglycerides: 273 mg/dL — ABNORMAL HIGH (ref <150)

## 2015-07-12 MED ORDER — INSULIN ASPART 100 UNIT/ML ~~LOC~~ SOLN
0.0000 [IU] | SUBCUTANEOUS | Status: DC
  Administered 2015-07-12 – 2015-07-13 (×4): 7 [IU] via SUBCUTANEOUS
  Administered 2015-07-13: 3 [IU] via SUBCUTANEOUS
  Administered 2015-07-13: 4 [IU] via SUBCUTANEOUS
  Administered 2015-07-13: 7 [IU] via SUBCUTANEOUS
  Administered 2015-07-13 (×2): 4 [IU] via SUBCUTANEOUS
  Administered 2015-07-14 (×2): 3 [IU] via SUBCUTANEOUS
  Administered 2015-07-14 (×2): 4 [IU] via SUBCUTANEOUS
  Administered 2015-07-14: 3 [IU] via SUBCUTANEOUS
  Administered 2015-07-15: 4 [IU] via SUBCUTANEOUS
  Administered 2015-07-15: 3 [IU] via SUBCUTANEOUS
  Administered 2015-07-15: 4 [IU] via SUBCUTANEOUS
  Administered 2015-07-15: 1 [IU] via SUBCUTANEOUS
  Administered 2015-07-16: 7 [IU] via SUBCUTANEOUS
  Administered 2015-07-16: 3 [IU] via SUBCUTANEOUS
  Administered 2015-07-16: 4 [IU] via SUBCUTANEOUS
  Administered 2015-07-16: 7 [IU] via SUBCUTANEOUS
  Administered 2015-07-17: 3 [IU] via SUBCUTANEOUS
  Administered 2015-07-17: 7 [IU] via SUBCUTANEOUS
  Administered 2015-07-17: 3 [IU] via SUBCUTANEOUS
  Administered 2015-07-17: 7 [IU] via SUBCUTANEOUS
  Administered 2015-07-17: 3 [IU] via SUBCUTANEOUS
  Administered 2015-07-18: 4 [IU] via SUBCUTANEOUS
  Administered 2015-07-18 (×3): 3 [IU] via SUBCUTANEOUS
  Administered 2015-07-19: 4 [IU] via SUBCUTANEOUS
  Administered 2015-07-19 – 2015-07-20 (×4): 3 [IU] via SUBCUTANEOUS

## 2015-07-12 MED ORDER — M.V.I. ADULT IV INJ
INJECTION | INTRAVENOUS | Status: DC
  Administered 2015-07-12: 18:00:00 via INTRAVENOUS
  Filled 2015-07-12: qty 1440

## 2015-07-12 MED ORDER — FAT EMULSION 20 % IV EMUL
240.0000 mL | INTRAVENOUS | Status: DC
  Administered 2015-07-12: 240 mL via INTRAVENOUS
  Filled 2015-07-12: qty 250

## 2015-07-12 MED ORDER — SODIUM CHLORIDE 0.45 % IV SOLN
INTRAVENOUS | Status: DC
  Administered 2015-07-12: 14:00:00 via INTRAVENOUS

## 2015-07-12 NOTE — Progress Notes (Addendum)
Date:  Jul 12, 2015 Chart reviewed for concurrent status and case management needs. Will continue to follow patient for changes and needs:  Iv solumedrol, iv tna, advancing diet Expected discharge date: [id number] Marcelle Smiling, BSN, [place], [place]   [phone]

## 2015-07-12 NOTE — Progress Notes (Signed)
PARENTERAL NUTRITION CONSULT NOTE - Follow Up  Pharmacy Consult for TPN Indication: prolonged malnutrition d/t colitis  Allergies  Allergen Reactions  . Codeine Nausea And Vomiting    Pt is unable to recall all reactions to codeine  . Meloxicam Other (See Comments)    Due to stage III kidney disease  . Penicillins     Has patient had a PCN reaction causing immediate rash, facial/tongue/throat swelling, SOB or lightheadedness with hypotension: unknown Has patient had a PCN reaction causing severe rash involving mucus membranes or skin necrosis: unknown Has patient had a PCN reaction that required hospitalization: yes, drs visit Has patient had a PCN reaction occurring within the last 10 years: yes If all of the above answers are "NO", then may proceed with Cephalosporin use.   Marland Kitchen Propoxyphene Hcl Nausea And Vomiting  . Tramadol Nausea Only    jittery    Patient Measurements: Height: 5\' 3"  (160 cm) Weight: 250 lb 7.1 oz (113.6 kg) IBW/kg (Calculated) : 52.4 Adjusted Body Weight: 74 kg Usual Weight: 106 kg  Vital Signs: Temp: 97.9 F (36.6 C) (05/29 0557) Temp Source: Oral (05/29 0557) BP: 114/55 mmHg (05/29 0557) Pulse Rate: 81 (05/29 0557) Intake/Output from previous day: 05/28 0701 - 05/29 0700 In: 2965.8 [P.O.:1320; I.V.:70; Blood:710; IV Piggyback:600; TPN:265.8] Out: 2875 [Urine:2175; Stool:700] Intake/Output from this shift: Total I/O In: 120 [P.O.:120] Out: -   Labs:  Recent Labs  07/10/15 0410 07/10/15 1555 07/11/15 0421 07/12/15 0755  WBC 18.0*  --  16.8* 15.7*  HGB 9.0* 8.9* 7.2* 10.7*  HCT 26.6* 26.4* 31.2* 31.1*  PLT 347  --  277 318     Recent Labs  07/09/15 1423 07/10/15 0410 07/11/15 0421 07/12/15 0430 07/12/15 0755  NA 133* 132* 135  --  135  K 3.5 3.3* 5.3*  --  4.1  CL 97* 101 108  --  106  CO2 27 25 22   --  23  GLUCOSE 206* 166* 172*  --  201*  BUN 22* 18 16  --  17  CREATININE 1.54* 1.29* 1.18*  --  1.23*  CALCIUM 7.4* 7.0*  7.3*  --  7.6*  MG  --   --  2.1  --  2.0  PHOS  --   --  3.3  --  3.0  PROT 5.5*  --  3.8*  --  4.6*  ALBUMIN 1.9*  --  1.3*  --  1.7*  AST 41  --  26  --  22  ALT 22  --  18  --  21  ALKPHOS 58  --  42  --  55  BILITOT 1.4*  --  0.5  --  0.3  PREALBUMIN  --   --   --  5.8*  --   TRIG  --   --   --  273*  --    Estimated Creatinine Clearance: 53.1 mL/min (by C-G formula based on Cr of 1.23).    Recent Labs  07/11/15 2355 07/12/15 0355 07/12/15 0744  GLUCAP 149* 173* 182*    Medical History: Past Medical History  Diagnosis Date  . Thyroid disease   . Hypercholesterolemia   . Arthritis   . GERD (gastroesophageal reflux disease)   . Diverticulitis   . Renal insufficiency     stage 3 kidney disease  . Sleep apnea     occasionally wears a c-pap  . Anxiety   . Osteoporosis   . Ulcerative colitis (HCC)  Insulin Requirements: 19 units SSI last 24hr  Current Nutrition: FLD; tolerating some broth, grits; no supplements ordered yet  IVF: none  Central access: PICC placed 5/28 TPN start date: 5/28  ASSESSMENT                                                                                                          HPI: 46 yoF with abd pain and diarrhea (sometimes bloody) x 1 month admitted 5/26 for symptomatic anemia.  Workup reveals infectious vs inflammatory colitis.  GI felt PO diet was inadequate and not planning to advance diet in immediate future, so Pharmacy consulted to start TPN.  Also noted with new AKI on admission secondary to dehydration from diarrhea.  Significant events:  5/29: per GI, continued bloody BMs but may take up to 7 days to see response from steroids, possible TNF inhibitor vs colectomy in future if no response  Today:   Glucose - No Hx DM; CBGs range 149-182 with one value of 266 since TPN started; no insulin ordered; on steroids for colitis  Electrolytes - WNL  Renal - AKI resolving  LFTs - wnl except albumin low; Tbili previously high  now normalized  TGs -273  Prealbumin - 5.8  NUTRITIONAL GOALS                                                                                             RD recs: 2000-2200 Kcal, 80-90g protein  Preliminary calculations: 2220 kcal/day (using ABW); 89-104 g/day protein Estimate taking in 200-300 kcal/day from PO nutrition; no supplements ordered yet  Clinimix 5/15 at a goal rate of 80 ml/hr + 20% fat emulsion at 10 ml/hr to provide: 96 g/day protein, 1843 Kcal/day.  PLAN                                                                                                                         At 1800 today:  Continue Clinimix 5/15 but will add lytes back and increase rate to 60 ml/hr  Continue 20% fat emulsion at 10 ml/hr.  Plan to advance as tolerated to the goal rate.  TPN to contain standard multivitamins and trace  elements.  Change SSI to resistant scale. Qualifies for resistant SSI w/ obesity and steroids; will start conservatively with moderate and low threshold to escalate  TPN lab panels on Mondays & Thursdays.  F/u daily.   Hessie Knows, PharmD, BCPS Pager [phone] 07/12/2015 9:32 AM

## 2015-07-12 NOTE — Progress Notes (Signed)
[facility] Progress Note    Since last GI note: TPN started yesterday.  Still 3-4 bloody BMs yesterday and two overnight.  She thinks this AM bowel bleeding was a bit less than previous.  Cbc pending from this morning.  Objective: Vital signs in last 24 hours: Temp:  [97.7 F (36.5 C)-98.4 F (36.9 C)] 97.9 F (36.6 C) (05/29 0557) Pulse Rate:  [81-93] 81 (05/29 0557) Resp:  [17-20] 18 (05/29 0557) BP: (99-143)/(44-73) 114/55 mmHg (05/29 0557) SpO2:  [96 %-100 %] 98 % (05/29 0557) Weight:  [250 lb 7.1 oz (113.6 kg)] 250 lb 7.1 oz (113.6 kg) (05/29 0557) Last BM Date: 07/11/15 General: alert and oriented times 3 Heart: regular rate and rythm Abdomen: soft, non-tender, non-distended, normal bowel sounds   Lab Results:  Recent Labs  07/09/15 2017 07/10/15 0410 07/10/15 1555 07/11/15 0421  WBC 16.7* 18.0*  --  16.8*  HGB 7.1* 9.0* 8.9* 7.2*  PLT 426* 347  --  277  MCV 90.2 86.4  --  87.9    Recent Labs  07/09/15 1423 07/10/15 0410 07/11/15 0421  NA 133* 132* 135  K 3.5 3.3* 5.3*  CL 97* 101 108  CO2 27 25 22   GLUCOSE 206* 166* 172*  BUN 22* 18 16  CREATININE 1.54* 1.29* 1.18*  CALCIUM 7.4* 7.0* 7.3*    Recent Labs  07/09/15 1423 07/11/15 0421  PROT 5.5* 3.8*  ALBUMIN 1.9* 1.3*  AST 41 26  ALT 22 18  ALKPHOS 58 42  BILITOT 1.4* 0.5   Medications: Scheduled Meds: . ALPRAZolam  0.25 mg Oral Daily  . cholecalciferol  5,000 Units Oral q morning - 10a  . ciprofloxacin  400 mg Intravenous BID  . ferrous sulfate  325 mg Oral BID WC  . folic acid  1 mg Oral Daily  . insulin aspart  0-15 Units Subcutaneous Q4H  . levothyroxine  100 mcg Oral QAC breakfast  . methylPREDNISolone (SOLU-MEDROL) injection  20 mg Intravenous Q8H  . metronidazole  500 mg Intravenous Q8H  . ondansetron  4 mg Intravenous BID  . pantoprazole  40 mg Oral Daily  . simvastatin  20 mg Oral q1800  . sodium chloride flush  10-40 mL Intracatheter Q12H   Continuous  Infusions: . TPN (CLINIMIX) Adult without lytes 40 mL/hr at 07/11/15 1741   And  . fat emulsion 240 mL (07/11/15 1741)   PRN Meds:.acetaminophen **OR** acetaminophen, albuterol, morphine injection, ondansetron **OR** ondansetron (ZOFRAN) IV, oxyCODONE, sodium chloride flush, traZODone    Assessment/Plan: 70 y.o. female severe, fulminant colitis (infection vs. IBD)  She will have completed 3 days of IV steroids later this evening (first dose Friday).  Still on IV cipro/flagyl. TPN started yesterday.  Still several bloody BMs daily. She has required 3 units blood so far and pending this morning CBC she may need more.  She feels bleeding may have slowed a bit this morning but otherwise really no significant improvement in her bleeding.  It can take 5-7 days for colitis symptoms to respond to IV steroids and so I would not consider her a failure yet.  Will draw quant gold and Hep B testing in case she does not respond, will be ready to offer inpatient TNF inhibitor (vs. Colectomy).  I'm keeping IV abx going for now, given the uncertainty explained previously.   Rachael Fee, MD  07/12/2015, 7:19 AM [facility] Pager [phone]

## 2015-07-12 NOTE — Progress Notes (Signed)
Pt is very edematous, hands and feet look like there is fluid built up. Pt has no history of CHF. Pt stated that it's only happened since she arrived to the hospital. Rockne Coons, RN

## 2015-07-12 NOTE — Progress Notes (Signed)
[facility] PROGRESS NOTE    Progress Note  Jamie Wood  NWG:[id number] DOB: 1945-10-12 DOA: 07/09/2015 PCP: Cala Bradford, MD     Brief Narrative:   Jamie Wood is an 70 y.o. female past medical history no GI discomfort with a recent endoscopy and 07/03/2015 that was congestive of possible inflammatory colitis however biopsies are not revealed inflammation, patient was treated empirically with mesalamine steroids, and previous GI pathogen was unremarkable the comes into the ED complaining of abdominal pain and bloody diarrhea for a month which has progressively gotten worse now she feels dizzy and lightheadedness, and ED was found to have a drop in the hemoglobin mild increase in her creatinine.  Assessment/Plan:   Colitis, nonspecific/  Diarrhea in adult patient: Inflammatory Colitis. This can take several day  to respond to IV steroids, appreciate GIs assistance O continue IV steroids and antibiotics. I will also appreciate surgery's assistance, surgery degrees and I agree that I would not consider this a failure to treatment yet. Continue IV fluids and Zofran. PICC line in place with started TNA.  Acute kidney injury/orthostatic hypotension: Likely due to pre-renal etiology, doesn't involve revising her creatinine will start gentle half-normal saline.  Hyponatremia: Likely due to hypovolemia continue IV fluid hydration. resolved  Normocytic anemia/anemia due to GI losses: She has received 3 unit of PRBC. Her hemoglobin now is 10.7.cont to monitor CBC.  Moderate protein caloric malnutrition: Albumin is 1.3, have encourage her to eat. On TNA.  DVT prophylaxis: SCD's Family Communication:Husband Disposition Plan/Barrier to D/C: unable to detemine Code Status:     Code Status Orders        Start     Ordered   07/09/15 1736  Full code   Continuous     07/09/15 1735    Code Status History    Date Active Date Inactive Code Status Order ID Comments User  Context   This patient has a current code status but no historical code status.    Advance Directive Documentation        Most Recent Value   Type of Advance Directive  Living will   Pre-existing out of facility DNR order (yellow form or pink MOST form)     "MOST" Form in Place?          IV Access:    Peripheral IV   Procedures and diagnostic studies:   No results found.   Medical Consultants:    None.  Anti-Infectives:   Cipro/Flagyl 5.26.2017  Subjective:    Jamie Wood she relates she feels better, continues to have bloody bowel movement, 4 overnight  Objective:    Filed Vitals:   07/11/15 1508 07/11/15 1556 07/11/15 2112 07/12/15 0557  BP: 124/59 115/61 143/73 114/55  Pulse: 87 89 88 81  Temp: 97.7 F (36.5 C) 97.8 F (36.6 C) 98.3 F (36.8 C) 97.9 F (36.6 C)  TempSrc: Oral Oral Oral Oral  Resp: Height:  (1.6 m)     Weight:    113.6 kg (250 lb 7.1 oz)  SpO2: 100% 99% 98% 98%    Intake/Output Summary (Last 24 hours) at 07/12/15 1115 Last data filed at 07/12/15 0810  Gross per 24 hour  Intake 2425.84 ml  Output   2875 ml  Net -449.16 ml   Filed Weights   07/09/15 1338 07/12/15 0557  Weight: 104.327 kg (230 lb) 113.6 kg (250 lb 7.1 oz)    Exam: General exam: In no  acute distress. Respiratory system: Good air movement and clear to auscultation. Cardiovascular system: S1 & S2 heard, RRR.  Gastrointestinal system: Abdomen is nondistended, soft and nontender.  Central nervous system: Alert and oriented. No focal neurological deficits. Extremities: No pedal edema. Skin: No rashes, lesions or ulcers Psychiatry: Judgement and insight appear normal. Mood & affect appropriate.    Data Reviewed:    Labs: Basic Metabolic Panel:  Recent Labs Lab 07/06/15 1133 07/09/15 1423 07/10/15 0410 07/11/15 0421 07/12/15 0755  NA 135 133* 132* 135 135  K 2.9* 3.5 3.3* 5.3* 4.1  CL 93* 97* 101 108 106  CO2 30 27 25 22 23     GLUCOSE 147* 206* 166* 172* 201*  BUN 21 22* 18 16 17   CREATININE 1.51* 1.54* 1.29* 1.18* 1.23*  CALCIUM 7.3* 7.4* 7.0* 7.3* 7.6*  MG  --   --   --  2.1 2.0  PHOS  --   --   --  3.3 3.0   GFR Estimated Creatinine Clearance: 53.1 mL/min (by C-G formula based on Cr of 1.23). Liver Function Tests:  Recent Labs Lab 07/06/15 1133 07/09/15 1423 07/11/15 0421 07/12/15 0755  AST 26 41 26 22  ALT 12 22 18 21   ALKPHOS 46 58 42 55  BILITOT 0.4 1.4* 0.5 0.3  PROT 5.1* 5.5* 3.8* 4.6*  ALBUMIN 2.3* 1.9* 1.3* 1.7*    Recent Labs Lab 07/09/15 1423  LIPASE 34   No results for input(s): AMMONIA in the last 168 hours. Coagulation profile No results for input(s): INR, PROTIME in the last 168 hours.  CBC:  Recent Labs Lab 07/06/15 1133 07/09/15 1423 07/09/15 2017 07/10/15 0410 07/10/15 1555 07/11/15 0421 07/12/15 0755  WBC 10.3 18.4* 16.7* 18.0*  --  16.8* 15.7*  NEUTROABS 8.2*  --   --   --   --   --  13.5*  HGB 11.6* 9.4* 7.1* 9.0* 8.9* 7.2* 10.7*  HCT 34.6* 27.2* 21.2* 26.6* 26.4* 31.2* 31.1*  MCV 89.8 89.2 90.2 86.4  --  87.9 86.4  PLT 460.0* 572* 426* 347  --  277 318   Cardiac Enzymes: No results for input(s): CKTOTAL, CKMB, CKMBINDEX, TROPONINI in the last 168 hours. BNP (last 3 results) No results for input(s): PROBNP in the last 8760 hours. CBG:  Recent Labs Lab 07/11/15 1618 07/11/15 1951 07/11/15 2355 07/12/15 0355 07/12/15 0744  GLUCAP 170* 266* 149* 173* 182*   D-Dimer: No results for input(s): DDIMER in the last 72 hours. Hgb A1c: No results for input(s): HGBA1C in the last 72 hours. Lipid Profile:  Recent Labs  07/12/15 0430  TRIG 273*   Thyroid function studies: No results for input(s): TSH, T4TOTAL, T3FREE, THYROIDAB in the last 72 hours.  Invalid input(s): FREET3 Anemia work up: No results for input(s): VITAMINB12, FOLATE, FERRITIN, TIBC, IRON, RETICCTPCT in the last 72 hours. Sepsis Labs:  Recent Labs Lab 07/09/15 2017  07/10/15 0410 07/11/15 0421 07/12/15 0755  WBC 16.7* 18.0* 16.8* 15.7*   Microbiology Recent Results (from the past 240 hour(s))  MRSA PCR Screening     Status: None   Collection Time: 07/09/15  1:36 PM  Result Value Ref Range Status   MRSA by PCR NEGATIVE NEGATIVE Final    Comment:        The GeneXpert MRSA Assay (FDA approved for NASAL specimens only), is one component of a comprehensive MRSA colonization surveillance program. It is not intended to diagnose MRSA infection nor to guide or monitor treatment for MRSA  infections.   C difficile quick scan w PCR reflex     Status: None   Collection Time: 07/10/15  2:08 AM  Result Value Ref Range Status   C Diff antigen NEGATIVE NEGATIVE Final   C Diff toxin NEGATIVE NEGATIVE Final   C Diff interpretation Negative for toxigenic C. difficile  Final  Gastrointestinal Panel by PCR , Stool     Status: None   Collection Time: 07/10/15  2:08 AM  Result Value Ref Range Status   Campylobacter species NOT DETECTED NOT DETECTED Final   Plesimonas shigelloides NOT DETECTED NOT DETECTED Final   Salmonella species NOT DETECTED NOT DETECTED Final   Yersinia enterocolitica NOT DETECTED NOT DETECTED Final   Vibrio species NOT DETECTED NOT DETECTED Final   Vibrio cholerae NOT DETECTED NOT DETECTED Final   Enteroaggregative E coli (EAEC) NOT DETECTED NOT DETECTED Final   Enteropathogenic E coli (EPEC) NOT DETECTED NOT DETECTED Final   Enterotoxigenic E coli (ETEC) NOT DETECTED NOT DETECTED Final   Shiga like toxin producing E coli (STEC) NOT DETECTED NOT DETECTED Final   E. coli O157 NOT DETECTED NOT DETECTED Final   Shigella/Enteroinvasive E coli (EIEC) NOT DETECTED NOT DETECTED Final   Cryptosporidium NOT DETECTED NOT DETECTED Final   Cyclospora cayetanensis NOT DETECTED NOT DETECTED Final   Entamoeba histolytica NOT DETECTED NOT DETECTED Final   Giardia lamblia NOT DETECTED NOT DETECTED Final   Adenovirus F40/41 NOT DETECTED NOT DETECTED  Final   Astrovirus NOT DETECTED NOT DETECTED Final   Norovirus GI/GII NOT DETECTED NOT DETECTED Final   Rotavirus A NOT DETECTED NOT DETECTED Final   Sapovirus (I, II, IV, and V) NOT DETECTED NOT DETECTED Final     Medications:   . ALPRAZolam  0.25 mg Oral Daily  . cholecalciferol  5,000 Units Oral q morning - 10a  . ciprofloxacin  400 mg Intravenous BID  . ferrous sulfate  325 mg Oral BID WC  . folic acid  1 mg Oral Daily  . insulin aspart  0-20 Units Subcutaneous Q4H  . levothyroxine  100 mcg Oral QAC breakfast  . methylPREDNISolone (SOLU-MEDROL) injection  20 mg Intravenous Q8H  . metronidazole  500 mg Intravenous Q8H  . ondansetron  4 mg Intravenous BID  . pantoprazole  40 mg Oral Daily  . simvastatin  20 mg Oral q1800  . sodium chloride flush  10-40 mL Intracatheter Q12H   Continuous Infusions: . TPN (CLINIMIX) Adult without lytes 40 mL/hr at 07/11/15 1741   And  . fat emulsion 240 mL (07/11/15 1741)  . Marland KitchenTPN (CLINIMIX-E) Adult     And  . fat emulsion      Time spent: 15 min   LOS: 3 days   Marinda Elk  [facility] Pager [phone]  *Please refer to [email or web address], password [email or web address] to get updated schedule on who will round on this patient, as hospitalists switch teams weekly. If 7PM-7AM, please contact night-coverage at [email or web address], password [email or web address] for any overnight needs.  07/12/2015, 11:15 AM

## 2015-07-12 NOTE — Clinical Documentation Improvement (Signed)
General Surgery Internal Medicine  Can the diagnosis of Shock be further specified? Please document response in next progress note. Thank you!   Shock, including Type: Cardiogenic, Hypovolemic, Hemorrhagic, Other type, including suspected or known cause and/or associated condition(s)  Other  Clinically Undetermined  Document any associated diagnoses/conditions.  Supporting Information:  "Transfuse to resolve shock" documented in 5/27 Surgical Consult  BP's running 93/35 with Map of 54, 110/40 Map of 64  Heart Rate > 90  Hgb 7.1/21; transfused  Please exercise your independent, professional judgment when responding. A specific answer is not anticipated or expected.  Thank You,  Shellee Milo RN, BSN, CCDS Health Information Management [place] [phone]; Cell: [phone]

## 2015-07-13 DIAGNOSIS — K922 Gastrointestinal hemorrhage, unspecified: Secondary | ICD-10-CM

## 2015-07-13 LAB — CBC WITH DIFFERENTIAL/PLATELET
BASOS ABS: 0 10*3/uL (ref 0.0–0.1)
Basophils Relative: 0 %
EOS PCT: 0 %
Eosinophils Absolute: 0 10*3/uL (ref 0.0–0.7)
HEMATOCRIT: 31.1 % — AB (ref 36.0–46.0)
HEMOGLOBIN: 10.7 g/dL — AB (ref 12.0–15.0)
LYMPHS PCT: 4 %
Lymphs Abs: 0.6 10*3/uL — ABNORMAL LOW (ref 0.7–4.0)
MCH: 29.7 pg (ref 26.0–34.0)
MCHC: 34.4 g/dL (ref 30.0–36.0)
MCV: 86.4 fL (ref 78.0–100.0)
MONOS PCT: 10 %
Monocytes Absolute: 1.6 10*3/uL — ABNORMAL HIGH (ref 0.1–1.0)
NEUTROS ABS: 13.5 10*3/uL — AB (ref 1.7–7.7)
Neutrophils Relative %: 86 %
Platelets: 318 10*3/uL (ref 150–400)
RBC: 3.6 MIL/uL — ABNORMAL LOW (ref 3.87–5.11)
RDW: 15.5 % (ref 11.5–15.5)
WBC: 15.7 10*3/uL — ABNORMAL HIGH (ref 4.0–10.5)

## 2015-07-13 LAB — COMPREHENSIVE METABOLIC PANEL
ALT: 17 U/L (ref 14–54)
AST: 19 U/L (ref 15–41)
Albumin: 1.7 g/dL — ABNORMAL LOW (ref 3.5–5.0)
Alkaline Phosphatase: 53 U/L (ref 38–126)
Anion gap: 4 — ABNORMAL LOW (ref 5–15)
BUN: 19 mg/dL (ref 6–20)
CHLORIDE: 104 mmol/L (ref 101–111)
CO2: 25 mmol/L (ref 22–32)
CREATININE: 1.22 mg/dL — AB (ref 0.44–1.00)
Calcium: 7.6 mg/dL — ABNORMAL LOW (ref 8.9–10.3)
GFR calc non Af Amer: 44 mL/min — ABNORMAL LOW (ref >60)
GFR, EST AFRICAN AMERICAN: 52 mL/min — AB (ref >60)
Glucose, Bld: 167 mg/dL — ABNORMAL HIGH (ref 65–99)
POTASSIUM: 4.3 mmol/L (ref 3.5–5.1)
SODIUM: 133 mmol/L — AB (ref 135–145)
Total Bilirubin: 0.2 mg/dL — ABNORMAL LOW (ref 0.3–1.2)
Total Protein: 4.5 g/dL — ABNORMAL LOW (ref 6.5–8.1)

## 2015-07-13 LAB — CBC
HEMATOCRIT: 31.1 % — AB (ref 36.0–46.0)
Hemoglobin: 10.4 g/dL — ABNORMAL LOW (ref 12.0–15.0)
MCH: 30.1 pg (ref 26.0–34.0)
MCHC: 33.4 g/dL (ref 30.0–36.0)
MCV: 90.1 fL (ref 78.0–100.0)
PLATELETS: 313 10*3/uL (ref 150–400)
RBC: 3.45 MIL/uL — AB (ref 3.87–5.11)
RDW: 15.6 % — ABNORMAL HIGH (ref 11.5–15.5)
WBC: 15.9 10*3/uL — ABNORMAL HIGH (ref 4.0–10.5)

## 2015-07-13 LAB — GLUCOSE, CAPILLARY
GLUCOSE-CAPILLARY: 182 mg/dL — AB (ref 65–99)
GLUCOSE-CAPILLARY: 184 mg/dL — AB (ref 65–99)
GLUCOSE-CAPILLARY: 208 mg/dL — AB (ref 65–99)
GLUCOSE-CAPILLARY: 139 mg/dL — AB (ref 65–99)
Glucose-Capillary: 177 mg/dL — ABNORMAL HIGH (ref 65–99)

## 2015-07-13 LAB — PHOSPHORUS: Phosphorus: 3.2 mg/dL (ref 2.5–4.6)

## 2015-07-13 LAB — MAGNESIUM: Magnesium: 1.9 mg/dL (ref 1.7–2.4)

## 2015-07-13 MED ORDER — MENTHOL 3 MG MT LOZG
1.0000 | LOZENGE | OROMUCOSAL | Status: DC | PRN
  Filled 2015-07-13: qty 9

## 2015-07-13 NOTE — Progress Notes (Addendum)
Patient ID: Jamie Wood, female   DOB: [birth year], 70 y.o.   MRN: [id number]    Progress Note   Subjective  Feels like she is making progress- no abdominal pain except some cramping before BM's - stool starting to form, no obvious blood today. Wants to eat   Objective   Vital signs in last 24 hours: Temp:  [98.1 F (36.7 C)-98.3 F (36.8 C)] 98.2 F (36.8 C) (05/30 0600) Pulse Rate:  [81-89] 84 (05/30 0600) Resp:  [18-20] 20 (05/30 0600) BP: (121-130)/(61-78) 130/78 mmHg (05/30 0600) SpO2:  [96 %-98 %] 97 % (05/30 0600) Last BM Date: 07/13/15 General:  Older white female in NAD Heart:  Regular rate and rhythm; no murmurs Lungs: Respirations even and unlabored, lungs CTA bilaterally Abdomen:  Soft, nontender and nondistended. Normal bowel sounds. Extremities:  Without edema. Neurologic:  Alert and oriented,  grossly normal neurologically. Psych:  Cooperative. Normal mood and affect.  Intake/Output from previous day: 05/29 0701 - 05/30 0700 In: 2929 [P.O.:360; I.V.:791.7; IV Piggyback:200; TPN:1577.3] Out: 300 [Urine:300] Intake/Output this shift:    Lab Results:  Recent Labs  07/11/15 0421 07/12/15 0755 07/13/15 0545  WBC 16.8* 15.7* 15.9*  HGB 7.2* 10.7* 10.4*  HCT 31.2* 31.1* 31.1*  PLT 277 318 313   BMET  Recent Labs  07/11/15 0421 07/12/15 0755 07/13/15 0545  NA 135 135 133*  K 5.3* 4.1 4.3  CL 108 106 104  CO2 22 23 25   GLUCOSE 172* 201* 167*  BUN 16 17 19   CREATININE 1.18* 1.23* 1.22*  CALCIUM 7.3* 7.6* 7.6*   LFT  Recent Labs  07/13/15 0545  PROT 4.5*  ALBUMIN 1.7*  AST 19  ALT 17  ALKPHOS 53  BILITOT 0.2*      Assessment / Plan:   #1 70 yo female with acute severe colitis - infectious workup negative, and bx from 5/23 consistent with mild active colitis. She seems to be responding to IV steroids - Day #4 Also on Cipro and Flagyl - will continue one more day #2 AKI - resolved #3 Anemia secondary to blood loss- stable #4  Weakness  Plan:  Advance to soft, low residue diet OOB, will ask PT to see - start ambulating Follow up Quantiferon gold and hep serology Stop IV abx tomorrow Stop TPN after current bag   Principal Problem:   Colitis, nonspecific Active Problems:   Anxiety state   GERD   Diverticulosis of large intestine   AKI (acute kidney injury) (HCC)   Anemia due to GI blood loss   Diarrhea in adult patient   Hypovolemia dehydration   Absolute anemia   Acute lower GI bleeding    LOS: 4 days   Jamie Wood  07/13/2015, 9:01 AM      Attending physician's note   I have taken an interval history, reviewed the chart and examined the patient. I agree with the Advanced Practitioner's note, impression and recommendations. Frequency of bowel movements and volume of bleeding have both significantly decreased. Working diagnosis is ulcerative colitis. Advance to a soft, low residue diet as tolerated. If tolerated will wean off TNA. No medication changes today.    Claudette Head, MD Clementeen Graham [phone] Mon-Fri 8a-5p [phone] after 5p, weekends, holidays

## 2015-07-13 NOTE — Progress Notes (Signed)
[facility] PROGRESS NOTE    Progress Note  Jamie Wood  FAO:[id number] DOB: Dec 16, 1945 DOA: 07/09/2015 PCP: Cala Bradford, MD     Brief Narrative:   Jamie Wood is an 70 y.o. female past medical history no GI discomfort with a recent endoscopy and 07/03/2015 that was congestive of possible inflammatory colitis however biopsies are not revealed inflammation, patient was treated empirically with mesalamine steroids, and previous GI pathogen was unremarkable the comes into the ED complaining of abdominal pain and bloody diarrhea for a month which has progressively gotten worse now she feels dizzy and lightheadedness, and ED was found to have a drop in the hemoglobin mild increase in her creatinine.  Assessment/Plan:   Colitis, nonspecific/  Diarrhea in adult patient: Inflammatory Colitis.  The spleen with bowels movement, continue IV steroids and I will be antibiotics per GI. Continue IV fluids and Zofran. PICC line in place cont TNA.  Acute kidney injury/orthostatic hypotension: Likely due to pre-renal etiology, doesn't involve revising her creatinine will start gentle half-normal saline.  Hyponatremia: Likely due to hypovolemia continue IV fluid hydration. resolved  Normocytic anemia/anemia due to GI losses: She has received 3 unit of PRBC. Her hemoglobin now is 10.7.cont to monitor CBC.  Moderate protein caloric malnutrition: Albumin is 1.3, have encourage her to eat. On TNA.  No cytosis: Due to steroids. DVT prophylaxis: SCD's Family Communication:Husband Disposition Plan/Barrier to D/C: unable to detemine Code Status:     Code Status Orders        Start     Ordered   07/09/15 1736  Full code   Continuous     07/09/15 1735    Code Status History    Date Active Date Inactive Code Status Order ID Comments User Context   This patient has a current code status but no historical code status.    Advance Directive Documentation        Most Recent Value   Type of Advance Directive  Living will   Pre-existing out of facility DNR order (yellow form or pink MOST form)     "MOST" Form in Place?          IV Access:    Peripheral IV   Procedures and diagnostic studies:   No results found.   Medical Consultants:    None.  Anti-Infectives:   Cipro/Flagyl 5.26.2017  Subjective:    Jamie Wood she relates she feels better, continues to have bloody bowel movement, 4 overnight  Objective:    Filed Vitals:   07/12/15 0557 07/12/15 1347 07/12/15 2037 07/13/15 0600  BP: 114/55 121/64 125/61 130/78  Pulse: 81 89 81 84  Temp: 97.9 F (36.6 C) 98.3 F (36.8 C) 98.1 F (36.7 C) 98.2 F (36.8 C)  TempSrc: Oral Oral Oral Oral  Resp: Height:      Weight: 113.6 kg (250 lb 7.1 oz)     SpO2: 98% 98% 96% 97%    Intake/Output Summary (Last 24 hours) at 07/13/15 1047 Last data filed at 07/13/15 0817  Gross per 24 hour  Intake   3049 ml  Output    300 ml  Net   2749 ml   Filed Weights   07/09/15 1338 07/12/15 0557  Weight: 104.327 kg (230 lb) 113.6 kg (250 lb 7.1 oz)    Exam: General exam: In no acute distress. Respiratory system: Good air movement and clear to auscultation. Cardiovascular system: S1 & S2 heard, RRR.  Gastrointestinal system:  Abdomen is nondistended, soft and nontender.  Central nervous system: Alert and oriented. No focal neurological deficits. Extremities: No pedal edema. Skin: No rashes, lesions or ulcers Psychiatry: Judgement and insight appear normal. Mood & affect appropriate.    Data Reviewed:    Labs: Basic Metabolic Panel:  Recent Labs Lab 07/09/15 1423 07/10/15 0410 07/11/15 0421 07/12/15 0755 07/13/15 0545  NA 133* 132* 135 135 133*  K 3.5 3.3* 5.3* 4.1 4.3  CL 97* 101 108 106 104  CO2 27 25 22 23 25   GLUCOSE 206* 166* 172* 201* 167*  BUN 22* 18 16 17 19   CREATININE 1.54* 1.29* 1.18* 1.23* 1.22*  CALCIUM 7.4* 7.0* 7.3* 7.6* 7.6*  MG  --   --  2.1 2.0 1.9    PHOS  --   --  3.3 3.0 3.2   GFR Estimated Creatinine Clearance: 53.6 mL/min (by C-G formula based on Cr of 1.22). Liver Function Tests:  Recent Labs Lab 07/06/15 1133 07/09/15 1423 07/11/15 0421 07/12/15 0755 07/13/15 0545  AST 26 41 26 22 19   ALT 12 22 18 21 17   ALKPHOS 46 58 42 55 53  BILITOT 0.4 1.4* 0.5 0.3 0.2*  PROT 5.1* 5.5* 3.8* 4.6* 4.5*  ALBUMIN 2.3* 1.9* 1.3* 1.7* 1.7*    Recent Labs Lab 07/09/15 1423  LIPASE 34   No results for input(s): AMMONIA in the last 168 hours. Coagulation profile No results for input(s): INR, PROTIME in the last 168 hours.  CBC:  Recent Labs Lab 07/06/15 1133  07/09/15 2017 07/10/15 0410 07/10/15 1555 07/11/15 0421 07/12/15 0755 07/13/15 0545  WBC 10.3  < > 16.7* 18.0*  --  16.8* 15.7* 15.9*  NEUTROABS 8.2*  --   --   --   --   --  13.5*  --   HGB 11.6*  < > 7.1* 9.0* 8.9* 7.2* 10.7* 10.4*  HCT 34.6*  < > 21.2* 26.6* 26.4* 31.2* 31.1* 31.1*  MCV 89.8  < > 90.2 86.4  --  87.9 86.4 90.1  PLT 460.0*  < > 426* 347  --  277 318 313  < > = values in this interval not displayed. Cardiac Enzymes: No results for input(s): CKTOTAL, CKMB, CKMBINDEX, TROPONINI in the last 168 hours. BNP (last 3 results) No results for input(s): PROBNP in the last 8760 hours. CBG:  Recent Labs Lab 07/12/15 1650 07/12/15 2036 07/12/15 2348 07/13/15 0349 07/13/15 0731  GLUCAP 216* 227* 203* 182* 177*   D-Dimer: No results for input(s): DDIMER in the last 72 hours. Hgb A1c: No results for input(s): HGBA1C in the last 72 hours. Lipid Profile:  Recent Labs  07/12/15 0430  TRIG 273*   Thyroid function studies: No results for input(s): TSH, T4TOTAL, T3FREE, THYROIDAB in the last 72 hours.  Invalid input(s): FREET3 Anemia work up: No results for input(s): VITAMINB12, FOLATE, FERRITIN, TIBC, IRON, RETICCTPCT in the last 72 hours. Sepsis Labs:  Recent Labs Lab 07/10/15 0410 07/11/15 0421 07/12/15 0755 07/13/15 0545  WBC 18.0*  16.8* 15.7* 15.9*   Microbiology Recent Results (from the past 240 hour(s))  MRSA PCR Screening     Status: None   Collection Time: 07/09/15  1:36 PM  Result Value Ref Range Status   MRSA by PCR NEGATIVE NEGATIVE Final    Comment:        The GeneXpert MRSA Assay (FDA approved for NASAL specimens only), is one component of a comprehensive MRSA colonization surveillance program. It is not intended to diagnose  MRSA infection nor to guide or monitor treatment for MRSA infections.   C difficile quick scan w PCR reflex     Status: None   Collection Time: 07/10/15  2:08 AM  Result Value Ref Range Status   C Diff antigen NEGATIVE NEGATIVE Final   C Diff toxin NEGATIVE NEGATIVE Final   C Diff interpretation Negative for toxigenic C. difficile  Final  Gastrointestinal Panel by PCR , Stool     Status: None   Collection Time: 07/10/15  2:08 AM  Result Value Ref Range Status   Campylobacter species NOT DETECTED NOT DETECTED Final   Plesimonas shigelloides NOT DETECTED NOT DETECTED Final   Salmonella species NOT DETECTED NOT DETECTED Final   Yersinia enterocolitica NOT DETECTED NOT DETECTED Final   Vibrio species NOT DETECTED NOT DETECTED Final   Vibrio cholerae NOT DETECTED NOT DETECTED Final   Enteroaggregative E coli (EAEC) NOT DETECTED NOT DETECTED Final   Enteropathogenic E coli (EPEC) NOT DETECTED NOT DETECTED Final   Enterotoxigenic E coli (ETEC) NOT DETECTED NOT DETECTED Final   Shiga like toxin producing E coli (STEC) NOT DETECTED NOT DETECTED Final   E. coli O157 NOT DETECTED NOT DETECTED Final   Shigella/Enteroinvasive E coli (EIEC) NOT DETECTED NOT DETECTED Final   Cryptosporidium NOT DETECTED NOT DETECTED Final   Cyclospora cayetanensis NOT DETECTED NOT DETECTED Final   Entamoeba histolytica NOT DETECTED NOT DETECTED Final   Giardia lamblia NOT DETECTED NOT DETECTED Final   Adenovirus F40/41 NOT DETECTED NOT DETECTED Final   Astrovirus NOT DETECTED NOT DETECTED Final    Norovirus GI/GII NOT DETECTED NOT DETECTED Final   Rotavirus A NOT DETECTED NOT DETECTED Final   Sapovirus (I, II, IV, and V) NOT DETECTED NOT DETECTED Final     Medications:   . ALPRAZolam  0.25 mg Oral Daily  . cholecalciferol  5,000 Units Oral q morning - 10a  . ciprofloxacin  400 mg Intravenous BID  . ferrous sulfate  325 mg Oral BID WC  . folic acid  1 mg Oral Daily  . insulin aspart  0-20 Units Subcutaneous Q4H  . levothyroxine  100 mcg Oral QAC breakfast  . methylPREDNISolone (SOLU-MEDROL) injection  20 mg Intravenous Q8H  . metronidazole  500 mg Intravenous Q8H  . ondansetron  4 mg Intravenous BID  . pantoprazole  40 mg Oral Daily  . simvastatin  20 mg Oral q1800  . sodium chloride flush  10-40 mL Intracatheter Q12H   Continuous Infusions: . sodium chloride 50 mL/hr at 07/12/15 1342  . Marland KitchenTPN (CLINIMIX-E) Adult 60 mL/hr at 07/12/15 1740   And  . fat emulsion 240 mL (07/12/15 1740)    Time spent: 15 min   LOS: 4 days   Marinda Elk  [facility] Pager [phone]  *Please refer to [email or web address], password [email or web address] to get updated schedule on who will round on this patient, as hospitalists switch teams weekly. If 7PM-7AM, please contact night-coverage at [email or web address], password [email or web address] for any overnight needs.  07/13/2015, 10:47 AM

## 2015-07-13 NOTE — Progress Notes (Signed)
Pharmacy Antibiotic Note  Jamie Wood is a 70 y.o. female admitted on 07/09/2015 with acute colitis.  Pharmacy has been consulted for Ciprofloxacin and Flagyl dosing.  Plan: D4 abx: Continue present dosing of Ciprofloxacin  IV q12h and Flagyl  IV q8h. Noted MD's plans to stop IV abx tomorrow.   Height:  (160 cm) Weight: 250 lb 7.1 oz (113.6 kg) IBW/kg (Calculated) : 52.4  Temp (24hrs), Avg:98.2 F (36.8 C), Min:98.1 F (36.7 C), Max:98.3 F (36.8 C)   Recent Labs Lab 07/09/15 1423 07/09/15 2017 07/10/15 0410 07/11/15 0421 07/12/15 0755 07/13/15 0545  WBC 18.4* 16.7* 18.0* 16.8* 15.7* 15.9*  CREATININE 1.54*  --  1.29* 1.18* 1.23* 1.22*    Estimated Creatinine Clearance: 53.6 mL/min (by C-G formula based on Cr of 1.22).    Allergies  Allergen Reactions  . Codeine Nausea And Vomiting    Pt is unable to recall all reactions to codeine  . Meloxicam Other (See Comments)    Due to stage III kidney disease  . Penicillins     Has patient had a PCN reaction causing immediate rash, facial/tongue/throat swelling, SOB or lightheadedness with hypotension: unknown Has patient had a PCN reaction causing severe rash involving mucus membranes or skin necrosis: unknown Has patient had a PCN reaction that required hospitalization: yes, drs visit Has patient had a PCN reaction occurring within the last 10 years: yes If all of the above answers are "NO", then may proceed with Cephalosporin use.   Marland Kitchen Propoxyphene Hcl Nausea And Vomiting  . Tramadol Nausea Only    jittery    Antimicrobials this admission: Ciprofloxacin 5/27 >> Flagyl 5/27 >>  Dose adjustments this admission: -  Microbiology results: 5/26 MRSA PCR: neg 5/27 C diff PCR: neg 5/27 GI panel: neg  Thank you for allowing pharmacy to be a part of this patient's care.   Greer Pickerel, PharmD, BCPS Pager: [phone] 07/13/2015 11:17 AM

## 2015-07-13 NOTE — Evaluation (Addendum)
Physical Therapy Evaluation Patient Details Name: Jamie Wood MRN: [id number] DOB: Mar 13, 1945 Today's Date: 07/13/2015   History of Present Illness  Jamie Wood is an 70 y.o. female past medical history no GI discomfort with a recent endoscopy and 07/03/2015 that was suggestive  of possible inflammatory colitis however biopsies are not revealed inflammation, patient was treated empirically steroids, and previous GI pathogen was unremarkable the comes into the ED complaining of abdominal pain and bloody diarrhea for a month which has progressively gotten worse now she feels dizzy and lightheaded  Clinical Impression  Pt admitted with above diagnosis. Pt currently with functional limitations due to the deficits listed below (see PT Problem List).  Pt will benefit from skilled PT to increase their independence and safety with mobility to allow discharge to the venue listed below.    Pt cooperative and motivated although limited mobility d/t bowel urgency/frequency; will see again later this week for incr amb/activity; do not feel at this time that pt will need f/u PT at home or DME as long as we are able to progress her activity during acute stay;  She demonstrates fairly good gait stability but her activity tolerance is significantly diminished; Will follow.     Follow Up Recommendations No PT follow up    Equipment Recommendations  None recommended by PT    Recommendations for Other Services       Precautions / Restrictions        Mobility  Bed Mobility Overal bed mobility: Modified Independent                Transfers Overall transfer level: Needs assistance   Transfers: Sit to/from Stand           General transfer comment: for lines and safety  Ambulation/Gait Ambulation/Gait assistance: Min guard Ambulation Distance (Feet): 25 Feet (in room  only (d/t bowel urgency)) Assistive device: None Gait Pattern/deviations: Step-through pattern;Wide base of  support;Decreased stride length     General Gait Details: pt fatigues easily, mild to moderate DOE with distance above  Stairs            Wheelchair Mobility    Modified Rankin (Stroke Patients Only)       Balance Overall balance assessment: Needs assistance   Sitting balance-Leahy Scale: Good       Standing balance-Leahy Scale: Fair                               Pertinent Vitals/Pain Pain Assessment: 0-10 Pain Score: 4  Pain Location: abd Pain Descriptors / Indicators: Sore Pain Intervention(s): Limited activity within patient's tolerance;Monitored during session    Home Living Family/patient expects to be discharged to:: Private residence Living Arrangements: Alone   Type of Home: House (townhouse) Home Access: Stairs to enter   Secretary/administrator of Steps: 1 Home Layout: One level Home Equipment: None Additional Comments: pt does not have any family close by, brother and sisiter -in-law in [place], they will likely come back when pt is D/C'd home if needed    Prior Function Level of Independence: Independent               Hand Dominance        Extremity/Trunk Assessment   Upper Extremity Assessment: Overall WFL for tasks assessed           Lower Extremity Assessment: Overall WFL for tasks assessed  Communication   Communication: No difficulties  Cognition Arousal/Alertness: Awake/alert Behavior During Therapy: WFL for tasks assessed/performed Overall Cognitive Status: Within Functional Limits for tasks assessed                      General Comments      Exercises   Ankle pumps, quad sets x 5--encouraged pt to do on her own as well     Assessment/Plan    PT Assessment Patient needs continued PT services  PT Diagnosis Difficulty walking   PT Problem List Decreased activity tolerance;Decreased mobility;Obesity  PT Treatment Interventions Gait training;Functional mobility  training;Therapeutic activities;Therapeutic exercise   PT Goals (Current goals can be found in the Care Plan section) Acute Rehab PT Goals Patient Stated Goal: to get better PT Goal Formulation: With patient Time For Goal Achievement: 07/23/15 Potential to Achieve Goals: Good    Frequency Min 3X/week   Barriers to discharge        Co-evaluation               End of Session   Activity Tolerance: Patient tolerated treatment well;Other (comment) (see gait section) Patient left: in bed;with call bell/phone within reach;with bed alarm set           Time: 1352-1410 PT Time Calculation (min) (ACUTE ONLY): 18 min   Charges:   PT Evaluation $PT Eval Low Complexity: 1 Procedure     PT G Codes:        Jamie Wood 07-20-15, 2:21 PM

## 2015-07-14 DIAGNOSIS — E861 Hypovolemia: Secondary | ICD-10-CM

## 2015-07-14 LAB — CBC
HEMATOCRIT: 30.4 % — AB (ref 36.0–46.0)
HEMOGLOBIN: 10.2 g/dL — AB (ref 12.0–15.0)
MCH: 30.6 pg (ref 26.0–34.0)
MCHC: 33.6 g/dL (ref 30.0–36.0)
MCV: 91.3 fL (ref 78.0–100.0)
Platelets: 272 10*3/uL (ref 150–400)
RBC: 3.33 MIL/uL — AB (ref 3.87–5.11)
RDW: 15.7 % — ABNORMAL HIGH (ref 11.5–15.5)
WBC: 13.1 10*3/uL — ABNORMAL HIGH (ref 4.0–10.5)

## 2015-07-14 LAB — HEPATITIS B SURFACE ANTIBODY, QUANTITATIVE

## 2015-07-14 LAB — HEPATITIS B SURFACE ANTIGEN: Hepatitis B Surface Ag: NEGATIVE

## 2015-07-14 LAB — GLUCOSE, CAPILLARY
GLUCOSE-CAPILLARY: 162 mg/dL — AB (ref 65–99)
GLUCOSE-CAPILLARY: 155 mg/dL — AB (ref 65–99)
Glucose-Capillary: 105 mg/dL — ABNORMAL HIGH (ref 65–99)
Glucose-Capillary: 121 mg/dL — ABNORMAL HIGH (ref 65–99)
Glucose-Capillary: 130 mg/dL — ABNORMAL HIGH (ref 65–99)
Glucose-Capillary: 135 mg/dL — ABNORMAL HIGH (ref 65–99)

## 2015-07-14 MED ORDER — METHYLPREDNISOLONE SODIUM SUCC 40 MG IJ SOLR
20.0000 mg | Freq: Two times a day (BID) | INTRAMUSCULAR | Status: DC
  Administered 2015-07-14: 20 mg via INTRAVENOUS
  Filled 2015-07-14 (×3): qty 0.5

## 2015-07-14 MED ORDER — PRO-STAT SUGAR FREE PO LIQD
30.0000 mL | Freq: Two times a day (BID) | ORAL | Status: DC
  Administered 2015-07-14 – 2015-07-17 (×8): 30 mL via ORAL
  Filled 2015-07-14 (×10): qty 30

## 2015-07-14 NOTE — Plan of Care (Signed)
Problem: Food- and Nutrition-Related Knowledge Deficit (NB-1.1) Goal: Nutrition education Formal process to instruct or train a patient/client in a skill or to impart knowledge to help patients/clients voluntarily manage or modify food choices and eating behavior to maintain or improve health. Outcome: Completed/Met Date Met:  07/14/15 Nutrition Education Note  RD consulted for nutrition education regarding a diet for ulcerative colitis.  RD provided "Fiber Restricted Nutrition Therapy" handout from the [employer]. Reviewed patient's dietary recall. Provided examples of low and high fiber foods. Discouraged intake of high fiber foods, processed foods, caffeine and red meats. Encouraged use of a multi-vitamin while following a low fiber diet. Teach back method used. Provided patient with list of possible food triggers for ulcerative colitis and a list of foods that may contain sulfates/sulphites.  Expect fair compliance.  Body mass index is 44.38 kg/(m^2). Pt meets criteria for obese class III based on current BMI.  Current diet order is regular, patient is consuming approximately 75-100% of meals at this time. Labs and medications reviewed. No further nutrition interventions warranted at this time. RD contact information provided. If additional nutrition issues arise, please re-consult RD.  [name]. Genny Caulder, MS, RD LDN Inpatient Clinical Dietitian Pager [phone]

## 2015-07-14 NOTE — Progress Notes (Signed)
Progress Note   Subjective  Jamie Wood is a 70 year old Caucasian female with a new diagnosis of inflammatory colitis. This morning the patient continues to feel like she is making progress. She denies any abdominal pain except some cramping before bowel movements. The patient notes that she continues to see no bright red blood in her stool though there is some whenthe nurses wipe her. She tells me that she has also had a decreased frequency of stooling, though they "seem to come shortly after I eat". Patient did start a regular diet last night and has done well. She does request to possibly see nutrition either while inpatient or as an outpatient for further recommendations on diet in the future. She is overall very pleased with her improvement. The patient does relay that she is somewhat hesitant to be discharged in the future as she "lives alone".   Objective   Vital signs in last 24 hours: Temp:  [98.1 F (36.7 C)-98.6 F (37 C)] 98.6 F (37 C) (05/31 0507) Pulse Rate:  [77-87] 77 (05/31 0507) Resp:  [16-20] 16 (05/31 0507) BP: (110-136)/(66-70) 110/66 mmHg (05/31 0507) SpO2:  [97 %-99 %] 97 % (05/31 0507) Last BM Date: 07/14/15 General:  Caucasian female in NAD Heart:  Regular rate and rhythm; no murmurs Lungs: Respirations even and unlabored, lungs CTA bilaterally Abdomen:  Soft, nontender and nondistended. Normal bowel sounds. Extremities:  Without edema. Neurologic:  Alert and oriented,  grossly normal neurologically. Psych:  Cooperative. Normal mood and affect.  Intake/Output from previous day: 05/30 0701 - 05/31 0700 In: 260 [P.O.:240; I.V.:20] Out: 700 [Urine:700]     Lab Results:  Recent Labs  07/12/15 0755 07/13/15 0545 07/14/15 0458  WBC 15.7* 15.9* 13.1*  HGB 10.7* 10.4* 10.2*  HCT 31.1* 31.1* 30.4*  PLT 318 313 272   BMET  Recent Labs  07/12/15 0755 07/13/15 0545  NA 135 133*  K 4.1 4.3  CL 106 104  CO2 23 25  GLUCOSE 201* 167*  BUN 17 19    CREATININE 1.23* 1.22*  CALCIUM 7.6* 7.6*   LFT  Recent Labs  07/13/15 0545  PROT 4.5*  ALBUMIN 1.7*  AST 19  ALT 17  ALKPHOS 53  BILITOT 0.2*      Assessment / Plan:   Assessment: 1. Acute severe colitis, suspected IBD, likely UC: Infectious workup negative, biopsies from 5/23 consistent with mild active colitis, patient is responding to IV steroids-day #5. Will discontinue Cipro and Flagyl. 2. AKI 3. Anemia secondary to blood loss: Stable 5. Weakness  Plan: 1. Continue on low residue diet 2. Would recommend ambulating with assistance 3. Will continue to wait on Quantiferon Gold results, Hep B serology negative 4. Will discontinue IV antibiotics 5. Continue IV steroids today, will discuss starting PO tomorrow 6. We will consult nutrition for diet education regarding anti-inflammatory diet per pt request 7. Will discuss above with Dr. Russella Dar, please await any further recommendations   Principal Problem:   Colitis, nonspecific Active Problems:   Anxiety state   GERD   Diverticulosis of large intestine   AKI (acute kidney injury) (HCC)   Anemia due to GI blood loss   Diarrhea in adult patient   Hypovolemia dehydration   Absolute anemia   Acute lower GI bleeding    LOS: 5 days   Unk Lightning  07/14/2015, 9:01 AM     Attending physician's note   I have taken an interval history, reviewed the chart and examined the  patient. I agree with the Advanced Practitioner's note, impression and recommendations. Presumed UC is improving steadily with current mgmt. Slight increase in diarrhea secondary to advanced diet. WBC improving however it remains mildly elevated likely due to corticosteroids and colitis. Cr is improving but has not returned to baseline. Continue low residue, low fat diet. Change IV Solumedrol to 40 mg IV q12h today. Consider changing to PO prednisone tomorrow if she continues to improve.  Claudette Head, MD [facility] [phone] Mon-Fri 8a-5p [phone]  after 5p, weekends, holidays

## 2015-07-14 NOTE — Care Management Important Message (Signed)
Important Message  Patient Details  Name: Jamie Wood MRN: [id number] Date of Birth: 04/05/1945   Medicare Important Message Given:  Yes    Teva, [place] 07/14/2015, 10:19 AMImportant Message  Patient Details  Name: Jamie Wood MRN: [id number] Date of Birth: [birth year]   Medicare Important Message Given:  Yes    Linna, [place] 07/14/2015, 10:19 AM

## 2015-07-14 NOTE — Progress Notes (Signed)
PROGRESS NOTE    Jamie Wood  QIO:[id number] DOB: [birth year] DOA: 07/09/2015  PCP: Cala Bradford, MD   Brief Narrative:  Jamie Wood is an 70 y.o. female past medical history no GI discomfort with a recent endoscopy and 07/03/2015 with biopsies revealing moderate inflammation in rectum and sigmoid areas. Treated empirically with Lialda and Rowasa enema. She did not improve and was started on Prednisone 40 mg daily. She followed up with GI and mentioned dizziness and ongoing loose stools and was sent to the ED and admitted for IV hydration, IV antibiotics and placed on IV steroids by GI and Imodium.   Subjective: Much less diarrhea today but still has had about 4 BMs this AM. Cant tell is there is still blood in stool. Tolerating solids. Not much urine output this AM  Assessment & Plan:   Principal Problem:   Colitis, nonspecific- GI bleed - biopsies done on colonoscopy on 5/23 as mentioned showed inflammation - GI suspecting UC- cont steroids per GI- antibiotics stopped after 5 days- c diff and GI pathogen panel negative -follow diarrhea - TNA was being given but now stopped  - quantiferon gold pending - hepatitis serologies negative   Active Problems:   Hyponatremia/ orthostatic hypotension/ Dehydration/ CKD 3 - Cr 1.51 on admission >> 1.1 (baseline) - IVF have been discontinued- follow oral intake  Acute blood loss anemia - slp 3 u PRBC- con to follow hemoglobin  Moderate protein calorie malnutrition  - TNA stopped- follow PO intake   DVT prophylaxis: SCDs Code Status: full code Family Communication:  Disposition Plan: home in 1-2 days Consultants:   GI Procedures:   Colonoscopy prior to this admission Antimicrobials:  Anti-infectives    Start     Dose/Rate Route Frequency Ordered Stop   07/10/15 0330  metroNIDAZOLE (FLAGYL) IVPB 500 mg  Status:  Discontinued     500 mg 100 mL/hr over 60 Minutes Intravenous Every 8 hours 07/10/15 0241 07/14/15 0914   07/10/15 0300  ciprofloxacin (CIPRO) IVPB 400 mg  Status:  Discontinued     400 mg 200 mL/hr over 60 Minutes Intravenous 2 times daily 07/10/15 0241 07/14/15 0914       Objective: Filed Vitals:   07/13/15 0600 07/13/15 1420 07/13/15 2115 07/14/15 0507  BP: 130/78 136/70 122/68 110/66  Pulse: 84 87 86 77  Temp: 98.2 F (36.8 C) 98.2 F (36.8 C) 98.1 F (36.7 C) 98.6 F (37 C)  TempSrc: Oral Oral Oral Oral  Resp: 20 20 20 16   Height:      Weight:      SpO2: 97% 99% 98% 97%    Intake/Output Summary (Last 24 hours) at 07/14/15 1308 Last data filed at 07/13/15 1719  Gross per 24 hour  Intake     20 ml  Output    700 ml  Net   -680 ml   Filed Weights   07/09/15 1338 07/12/15 0557  Weight: 104.327 kg (230 lb) 113.6 kg (250 lb 7.1 oz)    Examination: General exam: Appears comfortable  HEENT: PERRLA, oral mucosa moist, no sclera icterus or thrush Respiratory system: Clear to auscultation. Respiratory effort normal. Cardiovascular system: S1 & S2 heard, RRR.  No murmurs  Gastrointestinal system: Abdomen soft, non-tender, nondistended. Normal bowel sound. No organomegaly Central nervous system: Alert and oriented. No focal neurological deficits. Extremities: No cyanosis, clubbing or edema Skin: No rashes or ulcers Psychiatry:  Mood & affect appropriate.     Data Reviewed: I have personally reviewed  following labs and imaging studies  CBC:  Recent Labs Lab 07/10/15 0410 07/10/15 1555 07/11/15 0421 07/12/15 0755 07/13/15 0545 07/14/15 0458  WBC 18.0*  --  16.8* 15.7* 15.9* 13.1*  NEUTROABS  --   --   --  13.5*  --   --   HGB 9.0* 8.9* 7.2* 10.7* 10.4* 10.2*  HCT 26.6* 26.4* 31.2* 31.1* 31.1* 30.4*  MCV 86.4  --  87.9 86.4 90.1 91.3  PLT 347  --  277 318 313 272   Basic Metabolic Panel:  Recent Labs Lab 07/09/15 1423 07/10/15 0410 07/11/15 0421 07/12/15 0755 07/13/15 0545  NA 133* 132* 135 135 133*  K 3.5 3.3* 5.3* 4.1 4.3  CL 97* 101 108 106 104  CO2  27 25 22 23 25   GLUCOSE 206* 166* 172* 201* 167*  BUN 22* 18 16 17 19   CREATININE 1.54* 1.29* 1.18* 1.23* 1.22*  CALCIUM 7.4* 7.0* 7.3* 7.6* 7.6*  MG  --   --  2.1 2.0 1.9  PHOS  --   --  3.3 3.0 3.2   GFR: Estimated Creatinine Clearance: 53.6 mL/min (by C-G formula based on Cr of 1.22). Liver Function Tests:  Recent Labs Lab 07/09/15 1423 07/11/15 0421 07/12/15 0755 07/13/15 0545  AST 41 26 22 19   ALT 22 18 21 17   ALKPHOS 58 42 55 53  BILITOT 1.4* 0.5 0.3 0.2*  PROT 5.5* 3.8* 4.6* 4.5*  ALBUMIN 1.9* 1.3* 1.7* 1.7*    Recent Labs Lab 07/09/15 1423  LIPASE 34   No results for input(s): AMMONIA in the last 168 hours. Coagulation Profile: No results for input(s): INR, PROTIME in the last 168 hours. Cardiac Enzymes: No results for input(s): CKTOTAL, CKMB, CKMBINDEX, TROPONINI in the last 168 hours. BNP (last 3 results) No results for input(s): PROBNP in the last 8760 hours. HbA1C: No results for input(s): HGBA1C in the last 72 hours. CBG:  Recent Labs Lab 07/13/15 2117 07/14/15 0002 07/14/15 0345 07/14/15 0737 07/14/15 1137  GLUCAP 139* 121* 105* 130* 135*   Lipid Profile:  Recent Labs  07/12/15 0430  TRIG 273*   Thyroid Function Tests: No results for input(s): TSH, T4TOTAL, FREET4, T3FREE, THYROIDAB in the last 72 hours. Anemia Panel: No results for input(s): VITAMINB12, FOLATE, FERRITIN, TIBC, IRON, RETICCTPCT in the last 72 hours. Urine analysis:    Component Value Date/Time   COLORURINE AMBER* 07/10/2015 2016   APPEARANCEUR CLOUDY* 07/10/2015 2016   LABSPEC 1.018 07/10/2015 2016   PHURINE 6.0 07/10/2015 2016   GLUCOSEU NEGATIVE 07/10/2015 2016   HGBUR NEGATIVE 07/10/2015 2016   BILIRUBINUR NEGATIVE 07/10/2015 2016   KETONESUR NEGATIVE 07/10/2015 2016   PROTEINUR NEGATIVE 07/10/2015 2016   NITRITE NEGATIVE 07/10/2015 2016   LEUKOCYTESUR NEGATIVE 07/10/2015 2016   Sepsis Labs: @LABRCNTIP (procalcitonin:4,lacticidven:4) ) Recent Results  (from the past 240 hour(s))  MRSA PCR Screening     Status: None   Collection Time: 07/09/15  1:36 PM  Result Value Ref Range Status   MRSA by PCR NEGATIVE NEGATIVE Final    Comment:        The GeneXpert MRSA Assay (FDA approved for NASAL specimens only), is one component of a comprehensive MRSA colonization surveillance program. It is not intended to diagnose MRSA infection nor to guide or monitor treatment for MRSA infections.   C difficile quick scan w PCR reflex     Status: None   Collection Time: 07/10/15  2:08 AM  Result Value Ref Range Status   C Diff  antigen NEGATIVE NEGATIVE Final   C Diff toxin NEGATIVE NEGATIVE Final   C Diff interpretation Negative for toxigenic C. difficile  Final  Gastrointestinal Panel by PCR , Stool     Status: None   Collection Time: 07/10/15  2:08 AM  Result Value Ref Range Status   Campylobacter species NOT DETECTED NOT DETECTED Final   Plesimonas shigelloides NOT DETECTED NOT DETECTED Final   Salmonella species NOT DETECTED NOT DETECTED Final   Yersinia enterocolitica NOT DETECTED NOT DETECTED Final   Vibrio species NOT DETECTED NOT DETECTED Final   Vibrio cholerae NOT DETECTED NOT DETECTED Final   Enteroaggregative E coli (EAEC) NOT DETECTED NOT DETECTED Final   Enteropathogenic E coli (EPEC) NOT DETECTED NOT DETECTED Final   Enterotoxigenic E coli (ETEC) NOT DETECTED NOT DETECTED Final   Shiga like toxin producing E coli (STEC) NOT DETECTED NOT DETECTED Final   E. coli O157 NOT DETECTED NOT DETECTED Final   Shigella/Enteroinvasive E coli (EIEC) NOT DETECTED NOT DETECTED Final   Cryptosporidium NOT DETECTED NOT DETECTED Final   Cyclospora cayetanensis NOT DETECTED NOT DETECTED Final   Entamoeba histolytica NOT DETECTED NOT DETECTED Final   Giardia lamblia NOT DETECTED NOT DETECTED Final   Adenovirus F40/41 NOT DETECTED NOT DETECTED Final   Astrovirus NOT DETECTED NOT DETECTED Final   Norovirus GI/GII NOT DETECTED NOT DETECTED Final    Rotavirus A NOT DETECTED NOT DETECTED Final   Sapovirus (I, II, IV, and V) NOT DETECTED NOT DETECTED Final         Radiology Studies: No results found.    Scheduled Meds: . ALPRAZolam  0.25 mg Oral Daily  . cholecalciferol  5,000 Units Oral q morning - 10a  . ferrous sulfate  325 mg Oral BID WC  . folic acid  1 mg Oral Daily  . insulin aspart  0-20 Units Subcutaneous Q4H  . levothyroxine  100 mcg Oral QAC breakfast  . methylPREDNISolone (SOLU-MEDROL) injection  20 mg Intravenous Q8H  . ondansetron  4 mg Intravenous BID  . pantoprazole  40 mg Oral Daily  . simvastatin  20 mg Oral q1800  . sodium chloride flush  10-40 mL Intracatheter Q12H   Continuous Infusions:    LOS: 5 days    Time spent in minutes: 35    Samia Kukla, MD [facility] Pager: [email or web address] Password [email or web address] 07/14/2015, 1:08 PM

## 2015-07-15 ENCOUNTER — Inpatient Hospital Stay (HOSPITAL_COMMUNITY): Payer: Medicare Other

## 2015-07-15 DIAGNOSIS — R609 Edema, unspecified: Secondary | ICD-10-CM

## 2015-07-15 LAB — CBC
HCT: 27.9 % — ABNORMAL LOW (ref 36.0–46.0)
Hemoglobin: 9.1 g/dL — ABNORMAL LOW (ref 12.0–15.0)
MCH: 30.2 pg (ref 26.0–34.0)
MCHC: 32.6 g/dL (ref 30.0–36.0)
MCV: 92.7 fL (ref 78.0–100.0)
PLATELETS: 192 10*3/uL (ref 150–400)
RBC: 3.01 MIL/uL — ABNORMAL LOW (ref 3.87–5.11)
RDW: 15.7 % — AB (ref 11.5–15.5)
WBC: 9.4 10*3/uL (ref 4.0–10.5)

## 2015-07-15 LAB — BASIC METABOLIC PANEL
Anion gap: 5 (ref 5–15)
BUN: 23 mg/dL — AB (ref 6–20)
CHLORIDE: 104 mmol/L (ref 101–111)
CO2: 26 mmol/L (ref 22–32)
CREATININE: 1.25 mg/dL — AB (ref 0.44–1.00)
Calcium: 7.2 mg/dL — ABNORMAL LOW (ref 8.9–10.3)
GFR calc Af Amer: 50 mL/min — ABNORMAL LOW (ref >60)
GFR calc non Af Amer: 43 mL/min — ABNORMAL LOW (ref >60)
GLUCOSE: 186 mg/dL — AB (ref 65–99)
Potassium: 4.9 mmol/L (ref 3.5–5.1)
SODIUM: 135 mmol/L (ref 135–145)

## 2015-07-15 LAB — GLUCOSE, CAPILLARY
GLUCOSE-CAPILLARY: 105 mg/dL — AB (ref 65–99)
GLUCOSE-CAPILLARY: 133 mg/dL — AB (ref 65–99)
Glucose-Capillary: 103 mg/dL — ABNORMAL HIGH (ref 65–99)
Glucose-Capillary: 140 mg/dL — ABNORMAL HIGH (ref 65–99)
Glucose-Capillary: 182 mg/dL — ABNORMAL HIGH (ref 65–99)
Glucose-Capillary: 184 mg/dL — ABNORMAL HIGH (ref 65–99)

## 2015-07-15 LAB — PROTIME-INR
INR: 1.22 (ref 0.00–1.49)
PROTHROMBIN TIME: 15.1 s (ref 11.6–15.2)

## 2015-07-15 LAB — APTT: APTT: 26 s (ref 24–37)

## 2015-07-15 MED ORDER — HEPARIN (PORCINE) IN NACL 100-0.45 UNIT/ML-% IJ SOLN
1400.0000 [IU]/h | INTRAMUSCULAR | Status: DC
  Administered 2015-07-15 – 2015-07-16 (×2): 1400 [IU]/h via INTRAVENOUS
  Filled 2015-07-15 (×3): qty 250

## 2015-07-15 MED ORDER — HEPARIN BOLUS VIA INFUSION
2500.0000 [IU] | Freq: Once | INTRAVENOUS | Status: AC
  Administered 2015-07-15: 2500 [IU] via INTRAVENOUS
  Filled 2015-07-15: qty 2500

## 2015-07-15 MED ORDER — PREDNISONE 50 MG PO TABS
60.0000 mg | ORAL_TABLET | Freq: Every day | ORAL | Status: DC
  Administered 2015-07-15 – 2015-07-16 (×2): 60 mg via ORAL
  Filled 2015-07-15 (×3): qty 1

## 2015-07-15 NOTE — Progress Notes (Signed)
ANTICOAGULATION CONSULT NOTE - Initial Consult  Pharmacy Consult for Heparin Indication: DVT  Allergies  Allergen Reactions  . Codeine Nausea And Vomiting    Pt is unable to recall all reactions to codeine  . Meloxicam Other (See Comments)    Due to stage III kidney disease  . Penicillins     Has patient had a PCN reaction causing immediate rash, facial/tongue/throat swelling, SOB or lightheadedness with hypotension: unknown Has patient had a PCN reaction causing severe rash involving mucus membranes or skin necrosis: unknown Has patient had a PCN reaction that required hospitalization: yes, drs visit Has patient had a PCN reaction occurring within the last 10 years: yes If all of the above answers are "NO", then may proceed with Cephalosporin use.   Marland Kitchen Propoxyphene Hcl Nausea And Vomiting  . Tramadol Nausea Only    jittery    Patient Measurements: Height: 5\' 3"  (160 cm) Weight: 250 lb 7.1 oz (113.6 kg) IBW/kg (Calculated) : 52.4 Heparin Dosing Weight: 79.9  Vital Signs: Temp: 98.4 F (36.9 C) (06/01 1335) Temp Source: Oral (06/01 1335) BP: 126/72 mmHg (06/01 1335) Pulse Rate: 102 (06/01 1335)  Labs:  Recent Labs  07/13/15 0545 07/14/15 0458 07/15/15 0440  HGB 10.4* 10.2* 9.1*  HCT 31.1* 30.4* 27.9*  PLT 313 272 192  CREATININE 1.22*  --   --     Estimated Creatinine Clearance: 53.6 mL/min (by C-G formula based on Cr of 1.22).   Medical History: Past Medical History  Diagnosis Date  . Thyroid disease   . Hypercholesterolemia   . Arthritis   . GERD (gastroesophageal reflux disease)   . Diverticulitis   . Renal insufficiency     stage 3 kidney disease  . Sleep apnea     occasionally wears a c-pap  . Anxiety   . Osteoporosis   . Ulcerative colitis (HCC)     Medications:  Scheduled:  . ALPRAZolam  0.25 mg Oral Daily  . cholecalciferol  5,000 Units Oral q morning - 10a  . feeding supplement (PRO-STAT SUGAR FREE 64)  30 mL Oral BID  . ferrous sulfate   325 mg Oral BID WC  . folic acid  1 mg Oral Daily  . insulin aspart  0-20 Units Subcutaneous Q4H  . levothyroxine  100 mcg Oral QAC breakfast  . ondansetron  4 mg Intravenous BID  . pantoprazole  40 mg Oral Daily  . predniSONE  60 mg Oral Q breakfast  . simvastatin  20 mg Oral q1800  . sodium chloride flush  10-40 mL Intracatheter Q12H   Infusions:   PRN: acetaminophen **OR** acetaminophen, albuterol, menthol-cetylpyridinium, morphine injection, ondansetron **OR** ondansetron (ZOFRAN) IV, oxyCODONE, sodium chloride flush, traZODone  Assessment: 70 yo female with new diagnosis of suspected ulcerative colitis.  She had a PICC placed for TPN 5/28-5/30.  Right upper extremity doppler completed which showed a partially occlusive DVT in the axillary vein.  Pharmacy is consulted to dose IV heparin.  Goal of Therapy:  Heparin level 0.3-0.7 units/ml Monitor platelets by anticoagulation protocol: Yes   Plan:   Heparin 2500 units IV bolus x 1  Heparin 1400 units/hr IV infusion  Check heparin level in 8hrs  Daily heparin level and CBC  Loralee Pacas, PharmD, BCPS Pager: [phone] 07/15/2015,4:00 PM

## 2015-07-15 NOTE — Progress Notes (Addendum)
Progress Note   Subjective  Jamie Wood is a 70 year old Caucasian female with a new diagnosis of suspected ulcerative colitis. This morning, the patient tells me that she does not feel as good as she did yesterday. She does report that she ambulated down to the nurses station and sat up in her chair for an hour yesterday for the first time since admission. Patient has also continued on a low residue diet. She did speak with nutrition yesterday who was "helpful". The patient tells me that nursing reported a slight increase in bright red blood in her stools yesterday and that she had a decrease in hemoglobin overnight. Again, the patient tells me that she is not ready to go home as she lives by herself. She denies new symptoms. She does continue with abdominal cramping before a bowel movement which typically occurs after eating. Her bowel movements are continuing to increase in form. Additionally, the patient tells me that she has taken a pill to help her sleep and this morning she still feels groggy..   Objective   Vital signs in last 24 hours: Temp:  [98.2 F (36.8 C)-98.7 F (37.1 C)] 98.6 F (37 C) (06/01 0615) Pulse Rate:  [80-93] 88 (06/01 0615) Resp:  [17-18] 17 (06/01 0615) BP: (115-125)/(58-70) 116/58 mmHg (06/01 0615) SpO2:  [97 %-100 %] 97 % (06/01 0615) Last BM Date: 07/15/15   General:  Caucasian female in NAD Heart:  Regular rate and rhythm; no murmurs Lungs: Respirations even and unlabored, lungs CTA bilaterally Abdomen:  Soft, nontender and nondistended. Normal bowel sounds. Extremities:  Without edema. Neurologic:  Alert and oriented,  grossly normal neurologically. Psych:  Cooperative. Normal mood and affect.  Intake/Output from previous day: 05/31 0701 - 06/01 0700 In: 240 [P.O.:240] Out: -   Lab Results:  Recent Labs  07/13/15 0545 07/14/15 0458 07/15/15 0440  WBC 15.9* 13.1* 9.4  HGB 10.4* 10.2* 9.1*  HCT 31.1* 30.4* 27.9*  PLT 313 272 192    BMET  Recent Labs  07/13/15 0545  NA 133*  K 4.3  CL 104  CO2 25  GLUCOSE 167*  BUN 19  CREATININE 1.22*  CALCIUM 7.6*   LFT  Recent Labs  07/13/15 0545  PROT 4.5*  ALBUMIN 1.7*  AST 19  ALT 17  ALKPHOS 53  BILITOT 0.2*      Assessment / Plan:   Assessment: 1. Acute severe colitis, suspected IBD, likely UC: Negative, biopsies from 5/23 show mild active colitis, patient is responding well to IV steroids-day #6. 2. AKI-resolving 3. Anemia secondary to blood loss: Patient did have slight decrease in hemoglobin overnight from 10.2-9.1, nursing did report an increase in hematochezia after activity yesterday, likely this is the cause 4. Weakness-resolving  Plan: 1. Continue on low-residue diet 2. Continue ambulating with assistance 3. Waiting on Quantiferon Gold results 4. Will change patient to oral steroids today. Discontinued Solu-Medrol. Started oral Prednisone 60 mg every morning. Hopefully this will also help the patient sleep at night with only morning dosing. 5. Will discuss above with Dr. Russella Dar, please await any further recommendations  Principal Problem:   Colitis, nonspecific Active Problems:   Anxiety state   GERD   Diverticulosis of large intestine   AKI (acute kidney injury) (HCC)   Anemia due to GI blood loss   Diarrhea in adult patient   Hypovolemia dehydration   Absolute anemia   Acute lower GI bleeding    LOS: 6 days   Unk Lightning  07/15/2015, 9:31 AM      Attending physician's note   I have taken an interval history, reviewed the chart and examined the patient. I agree with the Advanced Practitioner's note, impression and recommendations. Continues to steadily improve. Slight increase in bleeding yesterday. Change to Prednisone 60 mg qam. Continue low residue diet. Target for discharge home tomorrow. BMET and CBC tomorrow. HBsAg negative. Quatiferon gold is pending. If Cr has improved consider resuming Lialda. Anticipate  starting a biologic, likely Remicade, as inpatient or outpatient when Quantiferon gold results return. Discussed biologic therapy, specifically Remicade, with patient and answered her questions.   Claudette Head, MD [facility] Graham [phone] Mon-Fri 8a-5p [phone] after 5p, weekends, holidays

## 2015-07-15 NOTE — Progress Notes (Signed)
Physical Therapy Treatment Patient Details Name: Jamie Wood MRN: [id number] DOB: May 18, 1945 Today's Date: 07/15/2015    History of Present Illness Jamie Wood is an 70 y.o. female past medical history no GI discomfort with a recent endoscopy and 07/03/2015 that was suggestive  of possible inflammatory colitis however biopsies are not revealed inflammation, patient was treated empirically steroids, and previous GI pathogen was unremarkable the comes into the ED complaining of abdominal pain and bloody diarrhea for a month which has progressively gotten worse now she feels dizzy and lightheaded    PT Comments    Assisted OOB to amb to bathroom.  Pt still having episodes of loose stools.  Assisted with hygiene then MD came into room.  Sat EOB x 5 min.  Assisted with amb a greater distance in hallway without need for any AD.  Good alternating gait.  Assisted back to bed.   Follow Up Recommendations  No PT follow up     Equipment Recommendations  None recommended by PT    Recommendations for Other Services       Precautions / Restrictions Restrictions Weight Bearing Restrictions: No    Mobility  Bed Mobility Overal bed mobility: Modified Independent             General bed mobility comments: increased time  Transfers Overall transfer level: Needs assistance Equipment used: None Transfers: Sit to/from Stand           General transfer comment: for lines and safety  Ambulation/Gait Ambulation/Gait assistance: Supervision;Min guard Ambulation Distance (Feet): 85 Feet Assistive device: None Gait Pattern/deviations: Step-through pattern     General Gait Details: tolerated increased distance.  Held to IV pole for mild gait instability   Stairs            Wheelchair Mobility    Modified Rankin (Stroke Patients Only)       Balance                                    Cognition Arousal/Alertness: Awake/alert Behavior During Therapy: WFL for  tasks assessed/performed Overall Cognitive Status: Within Functional Limits for tasks assessed                      Exercises      General Comments        Pertinent Vitals/Pain Pain Assessment: Faces Faces Pain Scale: Hurts a little bit Pain Location: R forearm (distal to PICC line) with noted edema Pain Descriptors / Indicators: Tightness;Discomfort Pain Intervention(s): Monitored during session;Repositioned (elevated)    Home Living                      Prior Function            PT Goals (current goals can now be found in the care plan section) Progress towards PT goals: Progressing toward goals    Frequency  Min 3X/week    PT Plan Current plan remains appropriate    Co-evaluation             End of Session Equipment Utilized During Treatment: Gait belt Activity Tolerance: Patient tolerated treatment well Patient left: in bed;with call bell/phone within reach     Time: 1004-1028 PT Time Calculation (min) (ACUTE ONLY): 24 min  Charges:  $Gait Training: 8-22 mins $Therapeutic Activity: 8-22 mins  G Codes:      Rica Koyanagi  PTA [facility]  Acute  Rehab Pager      [phone]

## 2015-07-15 NOTE — Progress Notes (Signed)
CPAP offered. Patient declines at this time. She is aware that she may call if she should change her mind or need further RT assistance.

## 2015-07-15 NOTE — Progress Notes (Signed)
VASCULAR LAB PRELIMINARY  PRELIMINARY  PRELIMINARY  PRELIMINARY  Right upper extremity venous duplex completed.    Preliminary report:  There is acute, partially occlusive DVT noted in the axillary vein in the axilla.  Thrombus does note extend into chest. PICC line well visualized.   Gave report to [place], Charity fundraiser.  Caliope Ruppert, RVT 07/15/2015, 3:42 PM

## 2015-07-15 NOTE — Progress Notes (Signed)
PROGRESS NOTE    Jamie Wood  DHD:[id number] DOB: May 09, 1945 DOA: 07/09/2015  PCP: Cala Bradford, MD   Brief Narrative:  Jamie Wood is an 70 y.o. female past medical history no GI discomfort with a recent endoscopy and 07/03/2015 with biopsies revealing moderate inflammation in rectum and sigmoid areas. Treated empirically with Lialda and Rowasa enema. She did not improve and was started on Prednisone 40 mg daily. She followed up with GI and mentioned dizziness and ongoing loose stools and was sent to the ED and admitted for IV hydration, IV antibiotics and placed on IV steroids by GI and Imodium.   Subjective: More blood in stool yesterday.   Assessment & Plan:   Principal Problem:   Colitis, nonspecific- GI bleed - biopsies done on colonoscopy on 5/23 as mentioned showed inflammation - GI suspecting UC- cont steroids per GI- antibiotics stopped after 5 days- c diff and GI pathogen panel negative -follow diarrhea - TNA was being given but now stopped  - quantiferon gold pending - hepatitis serologies negative  - prednisone started today with hopes to d/c home tomorrow if she is stable- if Cr continues to be normal tomorrow can resume Lialda  Active Problems:   Hyponatremia/ orthostatic hypotension/ Dehydration/ CKD 3 - Cr 1.51 on admission >> 1.1 (baseline) - IVF have been discontinued- states she has good oral intake  Acute blood loss anemia - s/p 3 u PRBC- con to follow hemoglobin  Moderate protein calorie malnutrition  - TNA stopped- follow PO intake   DVT prophylaxis: SCDs Code Status: full code Family Communication:  Disposition Plan: home in 1-2 days Consultants:   GI Procedures:   Colonoscopy prior to this admission Antimicrobials:  Anti-infectives    Start     Dose/Rate Route Frequency Ordered Stop   07/10/15 0330  metroNIDAZOLE (FLAGYL) IVPB 500 mg  Status:  Discontinued     500 mg 100 mL/hr over 60 Minutes Intravenous Every 8 hours 07/10/15 0241  07/14/15 0914   07/10/15 0300  ciprofloxacin (CIPRO) IVPB 400 mg  Status:  Discontinued     400 mg 200 mL/hr over 60 Minutes Intravenous 2 times daily 07/10/15 0241 07/14/15 0914       Objective: Filed Vitals:   07/14/15 2049 07/14/15 2127 07/15/15 0106 07/15/15 0615  BP:  115/66  116/58  Pulse:  80  88  Temp: 98.7 F (37.1 C) 98.6 F (37 C) 98.7 F (37.1 C) 98.6 F (37 C)  TempSrc: Oral Oral  Oral  Resp:  18  17  Height:      Weight:      SpO2:  99%  97%    Intake/Output Summary (Last 24 hours) at 07/15/15 1404 Last data filed at 07/15/15 1021  Gross per 24 hour  Intake      0 ml  Output     50 ml  Net    -50 ml   Filed Weights   07/09/15 1338 07/12/15 0557  Weight: 104.327 kg (230 lb) 113.6 kg (250 lb 7.1 oz)    Examination: General exam: Appears comfortable  HEENT: PERRLA, oral mucosa moist, no sclera icterus or thrush Respiratory system: Clear to auscultation. Respiratory effort normal. Cardiovascular system: S1 & S2 heard, RRR.  No murmurs  Gastrointestinal system: Abdomen soft, non-tender, nondistended. Normal bowel sound. No organomegaly Central nervous system: Alert and oriented. No focal neurological deficits. Extremities: No cyanosis, clubbing or edema Skin: No rashes or ulcers Psychiatry:  Mood & affect appropriate.  Data Reviewed: I have personally reviewed following labs and imaging studies  CBC:  Recent Labs Lab 07/11/15 0421 07/12/15 0755 07/13/15 0545 07/14/15 0458 07/15/15 0440  WBC 16.8* 15.7* 15.9* 13.1* 9.4  NEUTROABS  --  13.5*  --   --   --   HGB 7.2* 10.7* 10.4* 10.2* 9.1*  HCT 31.2* 31.1* 31.1* 30.4* 27.9*  MCV 87.9 86.4 90.1 91.3 92.7  PLT 277 318 313 272 192   Basic Metabolic Panel:  Recent Labs Lab 07/09/15 1423 07/10/15 0410 07/11/15 0421 07/12/15 0755 07/13/15 0545  NA 133* 132* 135 135 133*  K 3.5 3.3* 5.3* 4.1 4.3  CL 97* 101 108 106 104  CO2 GLUCOSE 206* 166* 172* 201* 167*  BUN 22* CREATININE 1.54* 1.29* 1.18* 1.23* 1.22*  CALCIUM 7.4* 7.0* 7.3* 7.6* 7.6*  MG  --   --  2.1 2.0 1.9  PHOS  --   --  3.3 3.0 3.2   GFR: Estimated Creatinine Clearance: 53.6 mL/min (by C-G formula based on Cr of 1.22). Liver Function Tests:  Recent Labs Lab 07/09/15 1423 07/11/15 0421 07/12/15 0755 07/13/15 0545  AST 41 ALT ALKPHOS 58 42 55 53  BILITOT 1.4* 0.5 0.3 0.2*  PROT 5.5* 3.8* 4.6* 4.5*  ALBUMIN 1.9* 1.3* 1.7* 1.7*    Recent Labs Lab 07/09/15 1423  LIPASE 34   No results for input(s): AMMONIA in the last 168 hours. Coagulation Profile: No results for input(s): INR, PROTIME in the last 168 hours. Cardiac Enzymes: No results for input(s): CKTOTAL, CKMB, CKMBINDEX, TROPONINI in the last 168 hours. BNP (last 3 results) No results for input(s): PROBNP in the last 8760 hours. HbA1C: No results for input(s): HGBA1C in the last 72 hours. CBG:  Recent Labs Lab 07/14/15 2001 07/15/15 0030 07/15/15 0356 07/15/15 0728 07/15/15 1138  GLUCAP 155* 140* 105* 103* 133*   Lipid Profile: No results for input(s): CHOL, HDL, LDLCALC, TRIG, CHOLHDL, LDLDIRECT in the last 72 hours. Thyroid Function Tests: No results for input(s): TSH, T4TOTAL, FREET4, T3FREE, THYROIDAB in the last 72 hours. Anemia Panel: No results for input(s): VITAMINB12, FOLATE, FERRITIN, TIBC, IRON, RETICCTPCT in the last 72 hours. Urine analysis:    Component Value Date/Time   COLORURINE AMBER* 07/10/2015 2016   APPEARANCEUR CLOUDY* 07/10/2015 2016   LABSPEC 1.018 07/10/2015 2016   PHURINE 6.0 07/10/2015 2016   GLUCOSEU NEGATIVE 07/10/2015 2016   HGBUR NEGATIVE 07/10/2015 2016   BILIRUBINUR NEGATIVE 07/10/2015 2016   KETONESUR NEGATIVE 07/10/2015 2016   PROTEINUR NEGATIVE 07/10/2015 2016   NITRITE NEGATIVE 07/10/2015 2016   LEUKOCYTESUR NEGATIVE 07/10/2015 2016   Sepsis Labs: (procalcitonin:4,lacticidven:4) ) Recent Results (from the past 240  hour(s))  MRSA PCR Screening     Status: None   Collection Time: 07/09/15  1:36 PM  Result Value Ref Range Status   MRSA by PCR NEGATIVE NEGATIVE Final    Comment:        The GeneXpert MRSA Assay (FDA approved for NASAL specimens only), is one component of a comprehensive MRSA colonization surveillance program. It is not intended to diagnose MRSA infection nor to guide or monitor treatment for MRSA infections.   C difficile quick scan w PCR reflex     Status: None   Collection Time: 07/10/15  2:08 AM  Result Value Ref Range Status   C Diff antigen NEGATIVE NEGATIVE Final  C Diff toxin NEGATIVE NEGATIVE Final   C Diff interpretation Negative for toxigenic C. difficile  Final  Gastrointestinal Panel by PCR , Stool     Status: None   Collection Time: 07/10/15  2:08 AM  Result Value Ref Range Status   Campylobacter species NOT DETECTED NOT DETECTED Final   Plesimonas shigelloides NOT DETECTED NOT DETECTED Final   Salmonella species NOT DETECTED NOT DETECTED Final   Yersinia enterocolitica NOT DETECTED NOT DETECTED Final   Vibrio species NOT DETECTED NOT DETECTED Final   Vibrio cholerae NOT DETECTED NOT DETECTED Final   Enteroaggregative E coli (EAEC) NOT DETECTED NOT DETECTED Final   Enteropathogenic E coli (EPEC) NOT DETECTED NOT DETECTED Final   Enterotoxigenic E coli (ETEC) NOT DETECTED NOT DETECTED Final   Shiga like toxin producing E coli (STEC) NOT DETECTED NOT DETECTED Final   E. coli O157 NOT DETECTED NOT DETECTED Final   Shigella/Enteroinvasive E coli (EIEC) NOT DETECTED NOT DETECTED Final   Cryptosporidium NOT DETECTED NOT DETECTED Final   Cyclospora cayetanensis NOT DETECTED NOT DETECTED Final   Entamoeba histolytica NOT DETECTED NOT DETECTED Final   Giardia lamblia NOT DETECTED NOT DETECTED Final   Adenovirus F40/41 NOT DETECTED NOT DETECTED Final   Astrovirus NOT DETECTED NOT DETECTED Final   Norovirus GI/GII NOT DETECTED NOT DETECTED Final   Rotavirus A NOT  DETECTED NOT DETECTED Final   Sapovirus (I, II, IV, and V) NOT DETECTED NOT DETECTED Final         Radiology Studies: No results found.    Scheduled Meds: . ALPRAZolam  0.25 mg Oral Daily  . cholecalciferol  5,000 Units Oral q morning - 10a  . feeding supplement (PRO-STAT SUGAR FREE 64)  30 mL Oral BID  . ferrous sulfate  325 mg Oral BID WC  . folic acid  1 mg Oral Daily  . insulin aspart  0-20 Units Subcutaneous Q4H  . levothyroxine  100 mcg Oral QAC breakfast  . ondansetron  4 mg Intravenous BID  . pantoprazole  40 mg Oral Daily  . predniSONE  60 mg Oral Q breakfast  . simvastatin  20 mg Oral q1800  . sodium chloride flush  10-40 mL Intracatheter Q12H   Continuous Infusions:    LOS: 6 days    Time spent in minutes: 35    Markala Sitts, MD [facility] Pager: [email or web address] Password [email or web address] 07/15/2015, 2:04 PM

## 2015-07-16 ENCOUNTER — Telehealth: Payer: Self-pay | Admitting: *Deleted

## 2015-07-16 DIAGNOSIS — K51011 Ulcerative (chronic) pancolitis with rectal bleeding: Principal | ICD-10-CM

## 2015-07-16 LAB — GLUCOSE, CAPILLARY
GLUCOSE-CAPILLARY: 105 mg/dL — AB (ref 65–99)
GLUCOSE-CAPILLARY: 168 mg/dL — AB (ref 65–99)
GLUCOSE-CAPILLARY: 177 mg/dL — AB (ref 65–99)
GLUCOSE-CAPILLARY: 214 mg/dL — AB (ref 65–99)
Glucose-Capillary: 112 mg/dL — ABNORMAL HIGH (ref 65–99)
Glucose-Capillary: 143 mg/dL — ABNORMAL HIGH (ref 65–99)
Glucose-Capillary: 241 mg/dL — ABNORMAL HIGH (ref 65–99)

## 2015-07-16 LAB — HEMOGLOBIN AND HEMATOCRIT, BLOOD
HCT: 28 % — ABNORMAL LOW (ref 36.0–46.0)
HEMATOCRIT: 26.7 % — AB (ref 36.0–46.0)
HEMOGLOBIN: 9.3 g/dL — AB (ref 12.0–15.0)
Hemoglobin: 8.7 g/dL — ABNORMAL LOW (ref 12.0–15.0)

## 2015-07-16 LAB — CBC
HCT: 26 % — ABNORMAL LOW (ref 36.0–46.0)
HEMOGLOBIN: 8.7 g/dL — AB (ref 12.0–15.0)
MCH: 30 pg (ref 26.0–34.0)
MCHC: 33.5 g/dL (ref 30.0–36.0)
MCV: 89.7 fL (ref 78.0–100.0)
PLATELETS: 220 10*3/uL (ref 150–400)
RBC: 2.9 MIL/uL — AB (ref 3.87–5.11)
RDW: 15.2 % (ref 11.5–15.5)
WBC: 10.3 10*3/uL (ref 4.0–10.5)

## 2015-07-16 LAB — BASIC METABOLIC PANEL
ANION GAP: 0 — AB (ref 5–15)
BUN: 23 mg/dL — AB (ref 6–20)
CHLORIDE: 109 mmol/L (ref 101–111)
CO2: 24 mmol/L (ref 22–32)
Calcium: 7.1 mg/dL — ABNORMAL LOW (ref 8.9–10.3)
Creatinine, Ser: 1.16 mg/dL — ABNORMAL HIGH (ref 0.44–1.00)
GFR, EST AFRICAN AMERICAN: 55 mL/min — AB (ref >60)
GFR, EST NON AFRICAN AMERICAN: 47 mL/min — AB (ref >60)
Glucose, Bld: 155 mg/dL — ABNORMAL HIGH (ref 65–99)
POTASSIUM: 4.6 mmol/L (ref 3.5–5.1)
SODIUM: 132 mmol/L — AB (ref 135–145)

## 2015-07-16 LAB — HEPARIN LEVEL (UNFRACTIONATED)
HEPARIN UNFRACTIONATED: 0.48 [IU]/mL (ref 0.30–0.70)
HEPARIN UNFRACTIONATED: 0.54 [IU]/mL (ref 0.30–0.70)

## 2015-07-16 MED ORDER — SODIUM CHLORIDE 0.9 % IV SOLN
INTRAVENOUS | Status: DC
  Administered 2015-07-16 – 2015-07-17 (×3): via INTRAVENOUS

## 2015-07-16 MED ORDER — METHYLPREDNISOLONE SODIUM SUCC 125 MG IJ SOLR
60.0000 mg | Freq: Every day | INTRAMUSCULAR | Status: DC
  Administered 2015-07-16 – 2015-07-23 (×8): 60 mg via INTRAVENOUS
  Filled 2015-07-16 (×8): qty 0.96

## 2015-07-16 MED ORDER — MESALAMINE 4 G RE ENEM
4.0000 g | ENEMA | Freq: Two times a day (BID) | RECTAL | Status: DC
  Administered 2015-07-16 – 2015-07-17 (×4): 4 g via RECTAL
  Filled 2015-07-16 (×6): qty 60

## 2015-07-16 MED ORDER — DICYCLOMINE HCL 10 MG PO CAPS
10.0000 mg | ORAL_CAPSULE | Freq: Three times a day (TID) | ORAL | Status: DC
  Administered 2015-07-16 – 2015-07-19 (×10): 10 mg via ORAL
  Filled 2015-07-16 (×16): qty 1

## 2015-07-16 NOTE — Telephone Encounter (Signed)
-----   Message from Sammuel Cooper, PA-C sent at 07/15/2015  4:48 PM EDT ----- Regarding: remicade [name]- this is an [facility] patient.. May need to start remicade in hospital ..please get her pre authorized asap ..just to make sure she will be able to afford as an outpatient ...only has medicare..thank you

## 2015-07-16 NOTE — Progress Notes (Signed)
ANTICOAGULATION CONSULT NOTE - f/u Consult  Pharmacy Consult for Heparin Indication: DVT  Allergies  Allergen Reactions  . Codeine Nausea And Vomiting    Pt is unable to recall all reactions to codeine  . Meloxicam Other (See Comments)    Due to stage III kidney disease  . Penicillins     Has patient had a PCN reaction causing immediate rash, facial/tongue/throat swelling, SOB or lightheadedness with hypotension: unknown Has patient had a PCN reaction causing severe rash involving mucus membranes or skin necrosis: unknown Has patient had a PCN reaction that required hospitalization: yes, drs visit Has patient had a PCN reaction occurring within the last 10 years: yes If all of the above answers are "NO", then may proceed with Cephalosporin use.   Marland Kitchen Propoxyphene Hcl Nausea And Vomiting  . Tramadol Nausea Only    jittery    Patient Measurements: Height: 5\' 3"  (160 cm) Weight: 250 lb 7.1 oz (113.6 kg) IBW/kg (Calculated) : 52.4 Heparin Dosing Weight: 79.9  Vital Signs: Temp: 98.8 F (37.1 C) (06/02 0505) Temp Source: Oral (06/02 0505) BP: 139/63 mmHg (06/02 0505) Pulse Rate: 84 (06/02 0505)  Labs:  Recent Labs  07/14/15 0458 07/15/15 0440 07/15/15 1601 07/16/15 0025  HGB 10.2* 9.1*  --  8.7*  HCT 30.4* 27.9*  --  26.0*  PLT 272 192  --  220  APTT  --   --  26  --   LABPROT  --   --  15.1  --   INR  --   --  1.22  --   HEPARINUNFRC  --   --   --  0.54  CREATININE  --   --  1.25* 1.16*    Estimated Creatinine Clearance: 56.3 mL/min (by C-G formula based on Cr of 1.16).   Medical History: Past Medical History  Diagnosis Date  . Thyroid disease   . Hypercholesterolemia   . Arthritis   . GERD (gastroesophageal reflux disease)   . Diverticulitis   . Renal insufficiency     stage 3 kidney disease  . Sleep apnea     occasionally wears a c-pap  . Anxiety   . Osteoporosis   . Ulcerative colitis (HCC)     Medications:  Scheduled:  . ALPRAZolam  0.25 mg  Oral Daily  . cholecalciferol  5,000 Units Oral q morning - 10a  . dicyclomine  10 mg Oral TID AC  . feeding supplement (PRO-STAT SUGAR FREE 64)  30 mL Oral BID  . ferrous sulfate  325 mg Oral BID WC  . folic acid  1 mg Oral Daily  . insulin aspart  0-20 Units Subcutaneous Q4H  . levothyroxine  100 mcg Oral QAC breakfast  . mesalamine  4 g Rectal BID  . methylPREDNISolone (SOLU-MEDROL) injection  60 mg Intravenous Daily  . ondansetron  4 mg Intravenous BID  . pantoprazole  40 mg Oral Daily  . simvastatin  20 mg Oral q1800  . sodium chloride flush  10-40 mL Intracatheter Q12H   Infusions:  . heparin 1,400 Units/hr (07/15/15 1900)   PRN: acetaminophen **OR** acetaminophen, albuterol, menthol-cetylpyridinium, morphine injection, ondansetron **OR** ondansetron (ZOFRAN) IV, oxyCODONE, sodium chloride flush, traZODone  Assessment: 70 yo female with new diagnosis of suspected ulcerative colitis.  She had a PICC placed for TPN 5/28-5/30.  Right upper extremity doppler completed which showed a partially occlusive DVT in the axillary vein felt related to PICC.  Pharmacy is consulted to dose IV heparin.   Baseline  INR, aPTT: wnl  Prior anticoagulation: none  Significant events: 6/2: small amts blood noted in BMs overnight  Today, 07/16/2015:  CBC: Hgb lower, Plt wnl  Most recent heparin level therapeutic on 1400 units/hr  Patient has chronically bloody stool with colitis, but only to a minor degree.  Patient hasn't noticed much difference since starting heparin, but RN thinks bleeding may have worsened.  CrCl: 56 ml/min, 52 ml/min normalized   Goal of Therapy: Heparin level 0.3-0.5 units/ml Monitor platelets by anticoagulation protocol: Yes  Plan:  Continue heparin IV infusion at 1400 units/hr. Since clot seems to be PICC-related, and given worsened blood in stool, will aim for lower end of therapeutic range.  Daily CBC, heparin level  Monitor for signs of bleeding or  thrombosis. Spoke with both RN and patient about monitoring stools closely for worsening bleeding   Bernadene Person, PharmD Pager: [phone] 07/16/2015, 10:34 AM

## 2015-07-16 NOTE — Progress Notes (Signed)
ANTICOAGULATION CONSULT NOTE - f/u Consult  Pharmacy Consult for Heparin Indication: DVT  Allergies  Allergen Reactions  . Codeine Nausea And Vomiting    Pt is unable to recall all reactions to codeine  . Meloxicam Other (See Comments)    Due to stage III kidney disease  . Penicillins     Has patient had a PCN reaction causing immediate rash, facial/tongue/throat swelling, SOB or lightheadedness with hypotension: unknown Has patient had a PCN reaction causing severe rash involving mucus membranes or skin necrosis: unknown Has patient had a PCN reaction that required hospitalization: yes, drs visit Has patient had a PCN reaction occurring within the last 10 years: yes If all of the above answers are "NO", then may proceed with Cephalosporin use.   Marland Kitchen Propoxyphene Hcl Nausea And Vomiting  . Tramadol Nausea Only    jittery    Patient Measurements: Height: 5\' 3"  (160 cm) Weight: 250 lb 7.1 oz (113.6 kg) IBW/kg (Calculated) : 52.4 Heparin Dosing Weight: 79.9  Vital Signs: Temp: 98.3 F (36.8 C) (06/01 2017) Temp Source: Oral (06/01 2017) BP: 128/61 mmHg (06/01 2017) Pulse Rate: 85 (06/01 2017)  Labs:  Recent Labs  07/13/15 0545 07/14/15 0458 07/15/15 0440 07/15/15 1601 07/16/15 0025  HGB 10.4* 10.2* 9.1*  --  8.7*  HCT 31.1* 30.4* 27.9*  --  26.0*  PLT 313 272 192  --  220  APTT  --   --   --  26  --   LABPROT  --   --   --  15.1  --   INR  --   --   --  1.22  --   HEPARINUNFRC  --   --   --   --  0.54  CREATININE 1.22*  --   --  1.25*  --     Estimated Creatinine Clearance: 52.3 mL/min (by C-G formula based on Cr of 1.25).   Medical History: Past Medical History  Diagnosis Date  . Thyroid disease   . Hypercholesterolemia   . Arthritis   . GERD (gastroesophageal reflux disease)   . Diverticulitis   . Renal insufficiency     stage 3 kidney disease  . Sleep apnea     occasionally wears a c-pap  . Anxiety   . Osteoporosis   . Ulcerative colitis (HCC)      Medications:  Scheduled:  . ALPRAZolam  0.25 mg Oral Daily  . cholecalciferol  5,000 Units Oral q morning - 10a  . feeding supplement (PRO-STAT SUGAR FREE 64)  30 mL Oral BID  . ferrous sulfate  325 mg Oral BID WC  . folic acid  1 mg Oral Daily  . insulin aspart  0-20 Units Subcutaneous Q4H  . levothyroxine  100 mcg Oral QAC breakfast  . ondansetron  4 mg Intravenous BID  . pantoprazole  40 mg Oral Daily  . predniSONE  60 mg Oral Q breakfast  . simvastatin  20 mg Oral q1800  . sodium chloride flush  10-40 mL Intracatheter Q12H   Infusions:  . heparin 1,400 Units/hr (07/15/15 1900)   PRN: acetaminophen **OR** acetaminophen, albuterol, menthol-cetylpyridinium, morphine injection, ondansetron **OR** ondansetron (ZOFRAN) IV, oxyCODONE, sodium chloride flush, traZODone  Assessment: 70 yo female with new diagnosis of suspected ulcerative colitis.  She had a PICC placed for TPN 5/28-5/30.  Right upper extremity doppler completed which showed a partially occlusive DVT in the axillary vein.  Pharmacy is consulted to dose IV heparin. 6/2 0025 HL=0.54, no infusion  problems or bleeding per RN  Goal of Therapy:  Heparin level 0.3-0.7 units/ml Monitor platelets by anticoagulation protocol: Yes   Plan:   Continue heparin infusion @ 1400 units/hr   Recheck HL at 11am today to ensure stays in range  Daily heparin level and CBC   Susanne Greenhouse R 07/16/2015,12:50 AM

## 2015-07-16 NOTE — Progress Notes (Signed)
Physical Therapy Treatment Patient Details Name: Jamie Wood MRN: [id number] DOB: 05-13-1945 Today's Date: 07/16/2015    History of Present Illness Jamie Wood is an 70 y.o. female past medical history no GI discomfort with a recent endoscopy and 07/03/2015 that was suggestive  of possible inflammatory colitis however biopsies are not revealed inflammation, patient was treated empirically steroids, and previous GI pathogen was unremarkable the comes into the ED complaining of abdominal pain and bloody diarrhea for a month which has progressively gotten worse now she feels dizzy and lightheaded    PT Comments    Assisted OOB to bathroom then amb in hallway.  Positioned in recliner.   Follow Up Recommendations  No PT follow up     Equipment Recommendations  None recommended by PT    Recommendations for Other Services       Precautions / Restrictions Precautions Precautions: None Restrictions Weight Bearing Restrictions: No    Mobility  Bed Mobility Overal bed mobility: Modified Independent             General bed mobility comments: increased time  Transfers Overall transfer level: Needs assistance Equipment used: None             General transfer comment: for lines and safety  Ambulation/Gait Ambulation/Gait assistance: Supervision;Min guard Ambulation Distance (Feet): 75 Feet Assistive device: None Gait Pattern/deviations: Step-through pattern     General Gait Details:  Held to IV pole for mild gait instability   Stairs            Wheelchair Mobility    Modified Rankin (Stroke Patients Only)       Balance                                    Cognition Arousal/Alertness: Awake/alert Behavior During Therapy: WFL for tasks assessed/performed Overall Cognitive Status: Within Functional Limits for tasks assessed                      Exercises      General Comments        Pertinent Vitals/Pain Pain Assessment:  No/denies pain    Home Living                      Prior Function            PT Goals (current goals can now be found in the care plan section) Progress towards PT goals: Progressing toward goals    Frequency  Min 3X/week    PT Plan Current plan remains appropriate    Co-evaluation             End of Session Equipment Utilized During Treatment: Gait belt Activity Tolerance: Patient tolerated treatment well Patient left: in chair;with call bell/phone within reach;with chair alarm set     Time: 3582-5189 PT Time Calculation (min) (ACUTE ONLY): 27 min  Charges:  $Gait Training: 8-22 mins $Therapeutic Activity: 8-22 mins                    G Codes:      Felecia Shelling  PTA [facility]  Acute  Rehab Pager      [phone]

## 2015-07-16 NOTE — Progress Notes (Signed)
PROGRESS NOTE    [name]  ZOX:[id number] DOB: Mar 03, 1945 DOA: 07/09/2015  PCP: Cala Bradford, MD   Brief Narrative:  Jamie Wood is an 70 y.o. female past medical history no GI discomfort with a recent endoscopy and 07/03/2015 with biopsies revealing moderate inflammation in rectum and sigmoid areas. Treated empirically with Lialda and Rowasa enema. She did not improve and was started on Prednisone 40 mg daily. She followed up with GI and mentioned dizziness and ongoing loose stools and was sent to the ED and admitted for IV hydration, IV antibiotics and placed on IV steroids by GI and Imodium.   Subjective: Neither patient nor nurses aware if there continues to be blood in the stool - no nausea or vomiting.   Assessment & Plan:   Principal Problem:   Colitis, nonspecific- GI bleed - biopsies done on colonoscopy on 5/23 as mentioned showed inflammation - GI suspecting UC- cont steroids per GI- antibiotics stopped after 5 days- c diff and GI pathogen panel negative -follow diarrhea - TNA was being given but now stopped  - quantiferon gold pending - hepatitis serologies negative  - prednisone started with hopes to d/c home however discovered RUE DVT yesterday and she was started on Heparin- following for futher bloody stools- Hb dropping-   Active Problems:  DVT right arm associated with PICC line - axillary DVT - Heparin infusion - PICC removed   Hyponatremia/ orthostatic hypotension/ Dehydration/ CKD 3 - Cr 1.2   >> now 1.1 (baseline)  - resume IVF today as sodium low and still having large amount of diarrhea  Acute blood loss anemia - s/p 3 u PRBC- cont to follow hemoglobin- dropping over the pat 2 days  Moderate protein calorie malnutrition  - TNA stopped- follow PO intake   DVT prophylaxis: SCDs Code Status: full code Family Communication:  Disposition Plan: home when bloodly stools resolve Consultants:   GI Procedures:   Colonoscopy prior to this  admission Antimicrobials:  Anti-infectives    Start     Dose/Rate Route Frequency Ordered Stop   07/10/15 0330  metroNIDAZOLE (FLAGYL) IVPB 500 mg  Status:  Discontinued     500 mg 100 mL/hr over 60 Minutes Intravenous Every 8 hours 07/10/15 0241 07/14/15 0914   07/10/15 0300  ciprofloxacin (CIPRO) IVPB 400 mg  Status:  Discontinued     400 mg 200 mL/hr over 60 Minutes Intravenous 2 times daily 07/10/15 0241 07/14/15 0914       Objective: Filed Vitals:   07/15/15 0615 07/15/15 1335 07/15/15 2017 07/16/15 0505  BP: 116/58 126/72 128/61 139/63  Pulse: 88 102 85 84  Temp: 98.6 F (37 C) 98.4 F (36.9 C) 98.3 F (36.8 C) 98.8 F (37.1 C)  TempSrc: Oral Oral Oral Oral  Resp: Height:      Weight:      SpO2: 97% 97% 97% 98%    Intake/Output Summary (Last 24 hours) at 07/16/15 1121 Last data filed at 07/16/15 0925  Gross per 24 hour  Intake    294 ml  Output    500 ml  Net   -206 ml   Filed Weights   07/09/15 1338 07/12/15 0557  Weight: 104.327 kg (230 lb) 113.6 kg (250 lb 7.1 oz)    Examination: General exam: Appears comfortable  HEENT: PERRLA, oral mucosa moist, no sclera icterus or thrush Respiratory system: Clear to auscultation. Respiratory effort normal. Cardiovascular system: S1 & S2 heard, RRR.  No murmurs  Gastrointestinal system: Abdomen soft, non-tender, nondistended. Normal bowel sound. No organomegaly Central nervous system: Alert and oriented. No focal neurological deficits. Extremities: No cyanosis, clubbing or edema Skin: No rashes or ulcers Psychiatry:  Mood & affect appropriate.     Data Reviewed: I have personally reviewed following labs and imaging studies  CBC:  Recent Labs Lab 07/12/15 0755 07/13/15 0545 07/14/15 0458 07/15/15 0440 07/16/15 0025 07/16/15 1041  WBC 15.7* 15.9* 13.1* 9.4 10.3  --   NEUTROABS 13.5*  --   --   --   --   --   HGB 10.7* 10.4* 10.2* 9.1* 8.7* 9.3*  HCT 31.1* 31.1* 30.4* 27.9* 26.0* 28.0*  MCV  86.4 90.1 91.3 92.7 89.7  --   PLT 318 313 272 192 220  --    Basic Metabolic Panel:  Recent Labs Lab 07/11/15 0421 07/12/15 0755 07/13/15 0545 07/15/15 1601 07/16/15 0025  NA 135 135 133* 135 132*  K 5.3* 4.1 4.3 4.9 4.6  CL 108 106 104 104 109  CO2 22 23 25 26 24   GLUCOSE 172* 201* 167* 186* 155*  BUN 16 17 19  23* 23*  CREATININE 1.18* 1.23* 1.22* 1.25* 1.16*  CALCIUM 7.3* 7.6* 7.6* 7.2* 7.1*  MG 2.1 2.0 1.9  --   --   PHOS 3.3 3.0 3.2  --   --    GFR: Estimated Creatinine Clearance: 56.3 mL/min (by C-G formula based on Cr of 1.16). Liver Function Tests:  Recent Labs Lab 07/09/15 1423 07/11/15 0421 07/12/15 0755 07/13/15 0545  AST 41 26 22 19   ALT 22 18 21 17   ALKPHOS 58 42 55 53  BILITOT 1.4* 0.5 0.3 0.2*  PROT 5.5* 3.8* 4.6* 4.5*  ALBUMIN 1.9* 1.3* 1.7* 1.7*    Recent Labs Lab 07/09/15 1423  LIPASE 34   No results for input(s): AMMONIA in the last 168 hours. Coagulation Profile:  Recent Labs Lab 07/15/15 1601  INR 1.22   Cardiac Enzymes: No results for input(s): CKTOTAL, CKMB, CKMBINDEX, TROPONINI in the last 168 hours. BNP (last 3 results) No results for input(s): PROBNP in the last 8760 hours. HbA1C: No results for input(s): HGBA1C in the last 72 hours. CBG:  Recent Labs Lab 07/15/15 1620 07/15/15 1942 07/15/15 2331 07/16/15 0457 07/16/15 0732  GLUCAP 182* 184* 177* 112* 105*   Lipid Profile: No results for input(s): CHOL, HDL, LDLCALC, TRIG, CHOLHDL, LDLDIRECT in the last 72 hours. Thyroid Function Tests: No results for input(s): TSH, T4TOTAL, FREET4, T3FREE, THYROIDAB in the last 72 hours. Anemia Panel: No results for input(s): VITAMINB12, FOLATE, FERRITIN, TIBC, IRON, RETICCTPCT in the last 72 hours. Urine analysis:    Component Value Date/Time   COLORURINE AMBER* 07/10/2015 2016   APPEARANCEUR CLOUDY* 07/10/2015 2016   LABSPEC 1.018 07/10/2015 2016   PHURINE 6.0 07/10/2015 2016   GLUCOSEU NEGATIVE 07/10/2015 2016   HGBUR  NEGATIVE 07/10/2015 2016   BILIRUBINUR NEGATIVE 07/10/2015 2016   KETONESUR NEGATIVE 07/10/2015 2016   PROTEINUR NEGATIVE 07/10/2015 2016   NITRITE NEGATIVE 07/10/2015 2016   LEUKOCYTESUR NEGATIVE 07/10/2015 2016   Sepsis Labs: @LABRCNTIP (procalcitonin:4,lacticidven:4) ) Recent Results (from the past 240 hour(s))  MRSA PCR Screening     Status: None   Collection Time: 07/09/15  1:36 PM  Result Value Ref Range Status   MRSA by PCR NEGATIVE NEGATIVE Final    Comment:        The GeneXpert MRSA Assay (FDA approved for NASAL specimens only), is one component of a comprehensive MRSA colonization  surveillance program. It is not intended to diagnose MRSA infection nor to guide or monitor treatment for MRSA infections.   C difficile quick scan w PCR reflex     Status: None   Collection Time: 07/10/15  2:08 AM  Result Value Ref Range Status   C Diff antigen NEGATIVE NEGATIVE Final   C Diff toxin NEGATIVE NEGATIVE Final   C Diff interpretation Negative for toxigenic C. difficile  Final  Gastrointestinal Panel by PCR , Stool     Status: None   Collection Time: 07/10/15  2:08 AM  Result Value Ref Range Status   Campylobacter species NOT DETECTED NOT DETECTED Final   Plesimonas shigelloides NOT DETECTED NOT DETECTED Final   Salmonella species NOT DETECTED NOT DETECTED Final   Yersinia enterocolitica NOT DETECTED NOT DETECTED Final   Vibrio species NOT DETECTED NOT DETECTED Final   Vibrio cholerae NOT DETECTED NOT DETECTED Final   Enteroaggregative E coli (EAEC) NOT DETECTED NOT DETECTED Final   Enteropathogenic E coli (EPEC) NOT DETECTED NOT DETECTED Final   Enterotoxigenic E coli (ETEC) NOT DETECTED NOT DETECTED Final   Shiga like toxin producing E coli (STEC) NOT DETECTED NOT DETECTED Final   E. coli O157 NOT DETECTED NOT DETECTED Final   Shigella/Enteroinvasive E coli (EIEC) NOT DETECTED NOT DETECTED Final   Cryptosporidium NOT DETECTED NOT DETECTED Final   Cyclospora  cayetanensis NOT DETECTED NOT DETECTED Final   Entamoeba histolytica NOT DETECTED NOT DETECTED Final   Giardia lamblia NOT DETECTED NOT DETECTED Final   Adenovirus F40/41 NOT DETECTED NOT DETECTED Final   Astrovirus NOT DETECTED NOT DETECTED Final   Norovirus GI/GII NOT DETECTED NOT DETECTED Final   Rotavirus A NOT DETECTED NOT DETECTED Final   Sapovirus (I, II, IV, and V) NOT DETECTED NOT DETECTED Final         Radiology Studies: No results found.    Scheduled Meds: . ALPRAZolam  0.25 mg Oral Daily  . cholecalciferol  5,000 Units Oral q morning - 10a  . dicyclomine  10 mg Oral TID AC  . feeding supplement (PRO-STAT SUGAR FREE 64)  30 mL Oral BID  . ferrous sulfate  325 mg Oral BID WC  . folic acid  1 mg Oral Daily  . insulin aspart  0-20 Units Subcutaneous Q4H  . levothyroxine  100 mcg Oral QAC breakfast  . mesalamine  4 g Rectal BID  . methylPREDNISolone (SOLU-MEDROL) injection  60 mg Intravenous Daily  . ondansetron  4 mg Intravenous BID  . pantoprazole  40 mg Oral Daily  . simvastatin  20 mg Oral q1800  . sodium chloride flush  10-40 mL Intracatheter Q12H   Continuous Infusions: . heparin 1,400 Units/hr (07/15/15 1900)     LOS: 7 days    Time spent in minutes: 35    Janet Decesare, MD [facility] Pager: [email or web address] Password [facility] 07/16/2015, 11:21 AM

## 2015-07-16 NOTE — Progress Notes (Signed)
Progress Note   Subjective  Some abdominal cramping, and seems to be having more BM's - had 2 during middle of night and 2 this am-small amts blood Hb 8.7 this am - down On heparin for RUE DVT   Objective   Vital signs in last 24 hours: Temp:  [98.3 F (36.8 C)-98.8 F (37.1 C)] 98.8 F (37.1 C) (06/02 0505) Pulse Rate:  [84-102] 84 (06/02 0505) Resp:  [16] 16 (06/02 0505) BP: (126-139)/(61-72) 139/63 mmHg (06/02 0505) SpO2:  [97 %-98 %] 98 % (06/02 0505) Last BM Date: 07/15/15 General:    white female in NAD, on commode Heart:  Regular rate and rhythm; no murmurs Lungs: Respirations even and unlabored, lungs CTA bilaterally Abdomen:  Soft, mildly tender and nondistended. Normal bowel sounds. Extremities:  Without edema. Neurologic:  Alert and oriented,  grossly normal neurologically. Psych:  Cooperative. Normal mood and affect.  Intake/Output from previous day: 06/01 0701 - 06/02 0700 In: 174 [I.V.:174] Out: 550 [Urine:550] Intake/Output this shift: Total I/O In: 120 [P.O.:120] Out: -   Lab Results:  Recent Labs  07/14/15 0458 07/15/15 0440 07/16/15 0025  WBC 13.1* 9.4 10.3  HGB 10.2* 9.1* 8.7*  HCT 30.4* 27.9* 26.0*  PLT 272 192 220   BMET  Recent Labs  07/15/15 1601 07/16/15 0025  NA 135 132*  K 4.9 4.6  CL 104 109  CO2 26 24  GLUCOSE 186* 155*  BUN 23* 23*  CREATININE 1.25* 1.16*  CALCIUM 7.2* 7.1*    PT/INR  Recent Labs  07/15/15 1601  LABPROT 15.1  INR 1.22      Assessment / Plan:    #1  70 yo female with new dx of ulcerative colitis - fairly severe on flex 5/23  Has responded to steroids but continues to have diarrhea/hematochezia. We have begun conversation regarding adding Biologic - may need to start Remicade during this admission - awaiting pre auth to be sure she will be able to afford outpt She states she has had Influenza vaccine, and has had pneumovax Quantiferon gold pending Hepatitis serologies negative  #2  anema- secondary to  blood loss- drifting as expected since addition of anticoagualtion  #3 AKI- resolved  Plan: Favor going back to Solumedrol if going to be in hospital over weekend - will restart solumedrol 60 IV daily Start Rowasa enemas  BID - watch creat Serial  hgb's - transfuse for hgb less than 8 Add bentyl TID ac  Hopefully can go ahead with Remicade soon - will see if can get answer today   Principal Problem:   Colitis, nonspecific Active Problems:   Anxiety state   GERD   Diverticulosis of large intestine   AKI (acute kidney injury) (HCC)   Anemia due to GI blood loss   Diarrhea in adult patient   Hypovolemia dehydration   Absolute anemia   Acute lower GI bleeding    LOS: 7 days   Amy Esterwood  07/16/2015, 9:37 AM     Attending physician's note   I have taken an interval history, reviewed the chart and examined the patient. I agree with the Advanced Practitioner's note, impression and recommendations. UC symptoms generally improved but persist and since she will remain hospitalized for RUE DVT will resume IV SoluMedrol 60 mg daily. Cr improved to baseline.Trial of Rowasa enemas and Bentyl tid ac. Begin Remicade when quatiferon gold returns and preauthorization process has been completed. Monitor CBC and for signs of increased bleeding while on anticoagulants.  Claudette Head, MD [facility] [phone] Mon-Fri 8a-5p [phone] after 5p, weekends, holidays

## 2015-07-16 NOTE — Telephone Encounter (Signed)
Spoke with [name] and patient has Medicare and [employer] as secondary. Does not require precert.

## 2015-07-17 DIAGNOSIS — K51911 Ulcerative colitis, unspecified with rectal bleeding: Secondary | ICD-10-CM

## 2015-07-17 DIAGNOSIS — D500 Iron deficiency anemia secondary to blood loss (chronic): Secondary | ICD-10-CM

## 2015-07-17 LAB — CBC
HEMATOCRIT: 24.4 % — AB (ref 36.0–46.0)
HEMOGLOBIN: 8 g/dL — AB (ref 12.0–15.0)
MCH: 30.2 pg (ref 26.0–34.0)
MCHC: 32.8 g/dL (ref 30.0–36.0)
MCV: 92.1 fL (ref 78.0–100.0)
Platelets: 184 10*3/uL (ref 150–400)
RBC: 2.65 MIL/uL — ABNORMAL LOW (ref 3.87–5.11)
RDW: 15.2 % (ref 11.5–15.5)
WBC: 6.6 10*3/uL (ref 4.0–10.5)

## 2015-07-17 LAB — BASIC METABOLIC PANEL
Anion gap: 4 — ABNORMAL LOW (ref 5–15)
BUN: 22 mg/dL — AB (ref 6–20)
CHLORIDE: 107 mmol/L (ref 101–111)
CO2: 25 mmol/L (ref 22–32)
CREATININE: 1.15 mg/dL — AB (ref 0.44–1.00)
Calcium: 7.3 mg/dL — ABNORMAL LOW (ref 8.9–10.3)
GFR calc Af Amer: 55 mL/min — ABNORMAL LOW (ref >60)
GFR calc non Af Amer: 48 mL/min — ABNORMAL LOW (ref >60)
GLUCOSE: 127 mg/dL — AB (ref 65–99)
POTASSIUM: 3.9 mmol/L (ref 3.5–5.1)
Sodium: 136 mmol/L (ref 135–145)

## 2015-07-17 LAB — GLUCOSE, CAPILLARY
GLUCOSE-CAPILLARY: 142 mg/dL — AB (ref 65–99)
GLUCOSE-CAPILLARY: 248 mg/dL — AB (ref 65–99)
GLUCOSE-CAPILLARY: 231 mg/dL — AB (ref 65–99)
Glucose-Capillary: 131 mg/dL — ABNORMAL HIGH (ref 65–99)
Glucose-Capillary: 105 mg/dL — ABNORMAL HIGH (ref 65–99)

## 2015-07-17 LAB — C-REACTIVE PROTEIN: CRP: 5.8 mg/dL — ABNORMAL HIGH (ref <1.0)

## 2015-07-17 LAB — HEPARIN LEVEL (UNFRACTIONATED)
HEPARIN UNFRACTIONATED: 0.56 [IU]/mL (ref 0.30–0.70)
Heparin Unfractionated: 0.7 IU/mL (ref 0.30–0.70)
Heparin Unfractionated: 0.41 IU/mL (ref 0.30–0.70)

## 2015-07-17 LAB — HEMOGLOBIN AND HEMATOCRIT, BLOOD
HCT: 23.9 % — ABNORMAL LOW (ref 36.0–46.0)
HEMATOCRIT: 25 % — AB (ref 36.0–46.0)
HEMATOCRIT: 25.4 % — AB (ref 36.0–46.0)
HEMOGLOBIN: 8.2 g/dL — AB (ref 12.0–15.0)
HEMOGLOBIN: 8.4 g/dL — AB (ref 12.0–15.0)
HEMOGLOBIN: 7.9 g/dL — AB (ref 12.0–15.0)

## 2015-07-17 LAB — SEDIMENTATION RATE: Sed Rate: 50 mm/hr — ABNORMAL HIGH (ref 0–22)

## 2015-07-17 LAB — QUANTIFERON IN TUBE
QUANTIFERON MITOGEN VALUE: 0.02 [IU]/mL
QUANTIFERON NIL VALUE: 0.04 [IU]/mL
QUANTIFERON TB AG VALUE: 0.03 IU/mL
QUANTIFERON TB GOLD: UNDETERMINED

## 2015-07-17 LAB — QUANTIFERON TB GOLD ASSAY (BLOOD)

## 2015-07-17 MED ORDER — HEPARIN (PORCINE) IN NACL 100-0.45 UNIT/ML-% IJ SOLN
1300.0000 [IU]/h | INTRAMUSCULAR | Status: DC
  Administered 2015-07-17: 1300 [IU]/h via INTRAVENOUS
  Filled 2015-07-17 (×2): qty 250

## 2015-07-17 MED ORDER — HEPARIN (PORCINE) IN NACL 100-0.45 UNIT/ML-% IJ SOLN
1250.0000 [IU]/h | INTRAMUSCULAR | Status: DC
  Filled 2015-07-17: qty 250

## 2015-07-17 NOTE — Progress Notes (Addendum)
Daily Rounding Note  07/18/2015, 10:23 AM  LOS: 9 days   SUBJECTIVE:       Persistent hematochezia, bloody stools.  Still with abdominal crampy pain.  Nausea controlled with BID Zofran.  Poor appetite but managing to consume 50 to 100% of full liquid trays.  Dizzy this AM.  PRBC x 1 infusing for Hgb drop from 7.9 to 6.8 in ~ 9 hours.      OBJECTIVE:         Vital signs in last 24 hours:    Temp:  [97.7 F (36.5 C)-98.6 F (37 C)] 97.7 F (36.5 C) (06/04 0957) Pulse Rate:  [74-98] 98 (06/04 0957) Resp:  [18] 18 (06/04 0957) BP: (111-144)/(35-53) 111/43 mmHg (06/04 0957) SpO2:  [92 %-100 %] 92 % (06/04 0957) Last BM Date: 07/18/15 Filed Weights   07/09/15 1338 07/12/15 0557  Weight: 104.327 kg (230 lb) 113.6 kg (250 lb 7.1 oz)   General: looks unwell.  No toxic.  comfortable   Heart: RRR Chest: clear bil    Abdomen: soft, ND, obese.  Diffusely tender but no guard or rebound.  BS present  Extremities: bruise on underside of right arm.  No pitting edema Neuro/Psych:  Oriented x 3.  Moves all 4s.  Seems depressed.   Intake/Output from previous day: 06/03 0701 - 06/04 0700 In: 1942.5 [P.O.:250; I.V.:1692.5] Out: 1090 [Urine:1090]  Intake/Output this shift:    Lab Results:  Recent Labs  07/16/15 0025  07/17/15 0538 07/17/15 1643 07/17/15 2159 07/18/15 0619  WBC 10.3  --  6.6  --   --  8.7  HGB 8.7*  < > 8.0* 8.4* 7.9* 6.8*  HCT 26.0*  < > 24.4* 25.4* 23.9* 20.9*  PLT 220  --  184  --   --  207  < > = values in this interval not displayed. BMET  Recent Labs  07/15/15 1601 07/16/15 0025 07/17/15 0538  NA 135 132* 136  K 4.9 4.6 3.9  CL 104 109 107  CO2 _0 GLUCOSE 186* 155* 127*  BUN 23* 23* 22*  CREATININE 1.25* 1.16* 1.15*  CALCIUM 7.2* 7.1* 7.3*   LFT No results for input(s): PROT, ALBUMIN, AST, ALT, ALKPHOS, BILITOT, BILIDIR, IBILI in the last 72 hours. PT/INR  Recent Labs   07/15/15 1601  LABPROT 15.1  INR 1.22   Hepatitis Panel No results for input(s): HEPBSAG, HCVAB, HEPAIGM, HEPBIGM in the last 72 hours.  Studies/Results: No results found.   Scheduled Meds: . sodium chloride   Intravenous Once  . ALPRAZolam  0.25 mg Oral Daily  . cholecalciferol  5,000 Units Oral q morning - 10a  . dicyclomine  10 mg Oral TID AC  . feeding supplement (PRO-STAT SUGAR FREE 64)  30 mL Oral TID  . ferrous sulfate  325 mg Oral BID WC  . folic acid  1 mg Oral Daily  . furosemide  40 mg Intravenous Once  . insulin aspart  0-20 Units Subcutaneous Q4H  . levothyroxine  100 mcg Oral QAC breakfast  . mesalamine  4 g Rectal BID  . methylPREDNISolone (SOLU-MEDROL) injection  60 mg Intravenous Daily  . ondansetron  4 mg Intravenous BID  . pantoprazole  40 mg Oral Daily  . simvastatin  20 mg Oral q1800  . sodium chloride flush  10-40 mL Intracatheter Q12H   Continuous Infusions:   PRN Meds:.acetaminophen **OR** acetaminophen, albuterol, menthol-cetylpyridinium, morphine injection, ondansetron **OR** ondansetron (  ZOFRAN) IV, oxyCODONE, sodium chloride flush, traZODone   ASSESMENT:   *  Ulcerative colitis.  Slow to improve.  Solumedrol restarted 6/2.  On Rowasa PR.   Plan initiation Remicade once preauthorization process completed.   Hep B negative. Qantiferon gold test "indeterminate".    *  Blood loss and chronic disease anemia.  On BID oral iron.  Transfusing x 1 PRBC currently.   *  PICC associated right arm DVT.  Heparin stopped (last dose was 6/3) due to GI bleeding and anemia.     PLAN   *  ? Contact ID to help with Quantiferon test interpretation.   *  Serial Hgbs.      Azucena Freed  07/18/2015, 10:23 AM Pager: [phone]     Attending physician's note   I have taken an interval history, reviewed the chart and examined the patient. I agree with the Advanced Practitioner's note, impression and recommendations.  1. Ulcerative colitis with worsening  bloody diarrhea. Continue IV SoluMedrol. ESR and CRP have decreased. Holding anticoagulation since bleeding has increased. Change diet to clear liquids. DC FeSO4. DC Rowasa enemas in case enemas are causing rectal irritation and bleeding. Need more information regarding quatiferon gold result that returned today. Case discussed with Dr. Havery Moros this weekend. Case will be discussed with Dr. Carlean Purl who will assume care on Monday. Consider Remicade at 10 mg/kg, at least for first few doses, if quantiferon allows. Consider repeat flex sig/colonoscopy. Care plans discussed in detail with the patient and her brother who was in her room visiting.    2. ABL anemia. Transfusion today to keep Hb > 8.   3. RUE DVT. IV heparin on hold   Lucio Edward, MD [facility] [phone] Mon-Fri 8a-5p [phone] after 5p, weekends, holidays

## 2015-07-17 NOTE — Progress Notes (Signed)
PROGRESS NOTE    Jamie Wood  TZG:[id number] DOB: 09/23/1945 DOA: 07/09/2015  PCP: Cala Bradford, MD   Brief Narrative:  Jamie Wood is an 70 y.o. female past medical history no GI discomfort with a recent endoscopy and 07/03/2015 with biopsies revealing moderate inflammation in rectum and sigmoid areas. Treated empirically with Lialda and Rowasa enema. She did not improve and was started on Prednisone 40 mg daily. She followed up with GI and mentioned dizziness and ongoing loose stools and was sent to the ED and admitted for IV hydration, IV antibiotics and placed on IV steroids by GI and Imodium.   Subjective: 4 bloody BMs over night. No abdominal pain.   Assessment & Plan:   Principal Problem:   Colitis, inflammatory- GI bleed- anemia due to acute blood loss - biopsies done on colonoscopy on 5/23 as mentioned showed inflammation - GI suspecting UC- cont steroids per GI- antibiotics stopped after 5 days- c diff and GI pathogen panel negative -follow diarrhea - TNA was being given but now stopped  - quantiferon gold pending - hepatitis serologies negative  - prednisone started with hopes to d/c home however discovered RUE DVTand she was started on Heparin and switched back to IV steroids due to worsening bleeding- GI planning to start Remicade as soon a quantiferron gold returns negative   - following for futher bloody stools- Hb dropping to 8- received 3 U PRBC already this admission- type and cross 1 U  Active Problems:  DVT right arm associated with PICC line - axillary DVT- recommend repeating duplex in 1 month- if clot has resolved, would recommend stopping anticoagulation - Heparin infusion - PICC removed   Hyponatremia/ orthostatic hypotension/ Dehydration/ CKD 3 - Cr 1.2   >> now 1.1 (baseline)  - resume IVF as sodium low and still having large amount of diarrhea  Moderate protein calorie malnutrition  - TNA stopped- follow PO intake   DVT prophylaxis: SCDs Code  Status: full code Family Communication:  Disposition Plan: home when bloodly stools resolve Consultants:   GI Procedures:   Colonoscopy prior to this admission Antimicrobials:  Anti-infectives    Start     Dose/Rate Route Frequency Ordered Stop   07/10/15 0330  metroNIDAZOLE (FLAGYL) IVPB 500 mg  Status:  Discontinued     500 mg 100 mL/hr over 60 Minutes Intravenous Every 8 hours 07/10/15 0241 07/14/15 0914   07/10/15 0300  ciprofloxacin (CIPRO) IVPB 400 mg  Status:  Discontinued     400 mg 200 mL/hr over 60 Minutes Intravenous 2 times daily 07/10/15 0241 07/14/15 0914       Objective: Filed Vitals:   07/16/15 0505 07/16/15 1442 07/16/15 2210 07/17/15 0438  BP: 139/63 118/55 171/72 141/65  Pulse: 84 88 94 71  Temp: 98.8 F (37.1 C) 98.7 F (37.1 C) 98.6 F (37 C) 98.7 F (37.1 C)  TempSrc: Oral Oral Oral Oral  Resp: 16 18 18 18   Height:      Weight:      SpO2: 98% 99% 98% 99%    Intake/Output Summary (Last 24 hours) at 07/17/15 1102 Last data filed at 07/17/15 0915  Gross per 24 hour  Intake   1220 ml  Output   1900 ml  Net   -680 ml   Filed Weights   07/09/15 1338 07/12/15 0557  Weight: 104.327 kg (230 lb) 113.6 kg (250 lb 7.1 oz)    Examination: General exam: Appears comfortable  HEENT: PERRLA, oral mucosa moist, no  sclera icterus or thrush Respiratory system: Clear to auscultation. Respiratory effort normal. Cardiovascular system: S1 & S2 heard, RRR.  No murmurs  Gastrointestinal system: Abdomen soft, non-tender, nondistended. Normal bowel sound. No organomegaly Central nervous system: Alert and oriented. No focal neurological deficits. Extremities: No cyanosis, clubbing or edema Skin: No rashes or ulcers Psychiatry:  Mood & affect appropriate.     Data Reviewed: I have personally reviewed following labs and imaging studies  CBC:  Recent Labs Lab 07/12/15 0755 07/13/15 0545 07/14/15 0458 07/15/15 0440 07/16/15 0025 07/16/15 1041  07/16/15 2132 07/17/15 0144 07/17/15 0538  WBC 15.7* 15.9* 13.1* 9.4 10.3  --   --   --  6.6  NEUTROABS 13.5*  --   --   --   --   --   --   --   --   HGB 10.7* 10.4* 10.2* 9.1* 8.7* 9.3* 8.7* 8.2* 8.0*  HCT 31.1* 31.1* 30.4* 27.9* 26.0* 28.0* 26.7* 25.0* 24.4*  MCV 86.4 90.1 91.3 92.7 89.7  --   --   --  92.1  PLT 318 313 272 192 220  --   --   --  184   Basic Metabolic Panel:  Recent Labs Lab 07/11/15 0421 07/12/15 0755 07/13/15 0545 07/15/15 1601 07/16/15 0025 07/17/15 0538  NA 135 135 133* 135 132* 136  K 5.3* 4.1 4.3 4.9 4.6 3.9  CL 108 106 104 104 109 107  CO2 GLUCOSE 172* 201* 167* 186* 155* 127*  BUN 23* 23* 22*  CREATININE 1.18* 1.23* 1.22* 1.25* 1.16* 1.15*  CALCIUM 7.3* 7.6* 7.6* 7.2* 7.1* 7.3*  MG 2.1 2.0 1.9  --   --   --   PHOS 3.3 3.0 3.2  --   --   --    GFR: Estimated Creatinine Clearance: 56.8 mL/min (by C-G formula based on Cr of 1.15). Liver Function Tests:  Recent Labs Lab 07/11/15 0421 07/12/15 0755 07/13/15 0545  AST ALT ALKPHOS 42 55 53  BILITOT 0.5 0.3 0.2*  PROT 3.8* 4.6* 4.5*  ALBUMIN 1.3* 1.7* 1.7*   No results for input(s): LIPASE, AMYLASE in the last 168 hours. No results for input(s): AMMONIA in the last 168 hours. Coagulation Profile:  Recent Labs Lab 07/15/15 1601  INR 1.22   Cardiac Enzymes: No results for input(s): CKTOTAL, CKMB, CKMBINDEX, TROPONINI in the last 168 hours. BNP (last 3 results) No results for input(s): PROBNP in the last 8760 hours. HbA1C: No results for input(s): HGBA1C in the last 72 hours. CBG:  Recent Labs Lab 07/16/15 1644 07/16/15 2012 07/16/15 2344 07/17/15 0336 07/17/15 0721  GLUCAP 214* 241* 143* 131* 105*   Lipid Profile: No results for input(s): CHOL, HDL, LDLCALC, TRIG, CHOLHDL, LDLDIRECT in the last 72 hours. Thyroid Function Tests: No results for input(s): TSH, T4TOTAL, FREET4, T3FREE, THYROIDAB in the last 72 hours. Anemia  Panel: No results for input(s): VITAMINB12, FOLATE, FERRITIN, TIBC, IRON, RETICCTPCT in the last 72 hours. Urine analysis:    Component Value Date/Time   COLORURINE AMBER* 07/10/2015 2016   APPEARANCEUR CLOUDY* 07/10/2015 2016   LABSPEC 1.018 07/10/2015 2016   PHURINE 6.0 07/10/2015 2016   GLUCOSEU NEGATIVE 07/10/2015 2016   HGBUR NEGATIVE 07/10/2015 2016   BILIRUBINUR NEGATIVE 07/10/2015 2016   KETONESUR NEGATIVE 07/10/2015 2016   PROTEINUR NEGATIVE 07/10/2015 2016   NITRITE NEGATIVE 07/10/2015 2016   LEUKOCYTESUR NEGATIVE 07/10/2015 2016  Sepsis Labs: @LABRCNTIP (procalcitonin:4,lacticidven:4) ) Recent Results (from the past 240 hour(s))  MRSA PCR Screening     Status: None   Collection Time: 07/09/15  1:36 PM  Result Value Ref Range Status   MRSA by PCR NEGATIVE NEGATIVE Final    Comment:        The GeneXpert MRSA Assay (FDA approved for NASAL specimens only), is one component of a comprehensive MRSA colonization surveillance program. It is not intended to diagnose MRSA infection nor to guide or monitor treatment for MRSA infections.   C difficile quick scan w PCR reflex     Status: None   Collection Time: 07/10/15  2:08 AM  Result Value Ref Range Status   C Diff antigen NEGATIVE NEGATIVE Final   C Diff toxin NEGATIVE NEGATIVE Final   C Diff interpretation Negative for toxigenic C. difficile  Final  Gastrointestinal Panel by PCR , Stool     Status: None   Collection Time: 07/10/15  2:08 AM  Result Value Ref Range Status   Campylobacter species NOT DETECTED NOT DETECTED Final   Plesimonas shigelloides NOT DETECTED NOT DETECTED Final   Salmonella species NOT DETECTED NOT DETECTED Final   Yersinia enterocolitica NOT DETECTED NOT DETECTED Final   Vibrio species NOT DETECTED NOT DETECTED Final   Vibrio cholerae NOT DETECTED NOT DETECTED Final   Enteroaggregative E coli (EAEC) NOT DETECTED NOT DETECTED Final   Enteropathogenic E coli (EPEC) NOT DETECTED NOT DETECTED  Final   Enterotoxigenic E coli (ETEC) NOT DETECTED NOT DETECTED Final   Shiga like toxin producing E coli (STEC) NOT DETECTED NOT DETECTED Final   E. coli O157 NOT DETECTED NOT DETECTED Final   Shigella/Enteroinvasive E coli (EIEC) NOT DETECTED NOT DETECTED Final   Cryptosporidium NOT DETECTED NOT DETECTED Final   Cyclospora cayetanensis NOT DETECTED NOT DETECTED Final   Entamoeba histolytica NOT DETECTED NOT DETECTED Final   Giardia lamblia NOT DETECTED NOT DETECTED Final   Adenovirus F40/41 NOT DETECTED NOT DETECTED Final   Astrovirus NOT DETECTED NOT DETECTED Final   Norovirus GI/GII NOT DETECTED NOT DETECTED Final   Rotavirus A NOT DETECTED NOT DETECTED Final   Sapovirus (I, II, IV, and V) NOT DETECTED NOT DETECTED Final         Radiology Studies: No results found.    Scheduled Meds: . ALPRAZolam  0.25 mg Oral Daily  . cholecalciferol  5,000 Units Oral q morning - 10a  . dicyclomine  10 mg Oral TID AC  . feeding supplement (PRO-STAT SUGAR FREE 64)  30 mL Oral BID  . ferrous sulfate  325 mg Oral BID WC  . folic acid  1 mg Oral Daily  . insulin aspart  0-20 Units Subcutaneous Q4H  . levothyroxine  100 mcg Oral QAC breakfast  . mesalamine  4 g Rectal BID  . methylPREDNISolone (SOLU-MEDROL) injection  60 mg Intravenous Daily  . ondansetron  4 mg Intravenous BID  . pantoprazole  40 mg Oral Daily  . simvastatin  20 mg Oral q1800  . sodium chloride flush  10-40 mL Intracatheter Q12H   Continuous Infusions: . sodium chloride 100 mL/hr at 07/17/15 0858  . heparin 1,300 Units/hr (07/17/15 0842)     LOS: 8 days    Time spent in minutes: 35    Jazlynn Nemetz, MD [facility] Pager: [email or web address] Password [email or web address] 07/17/2015, 11:02 AM

## 2015-07-17 NOTE — Progress Notes (Signed)
    Progress Note   Subjective  Bloody diarrhea persists. Amount of bleeding has increased since anticoagulation started.    Objective  Vital signs in last 24 hours: Temp:  [98.6 F (37 C)-98.7 F (37.1 C)] 98.7 F (37.1 C) (06/03 0438) Pulse Rate:  [71-94] 71 (06/03 0438) Resp:  [18] 18 (06/03 0438) BP: (118-171)/(55-72) 141/65 mmHg (06/03 0438) SpO2:  [98 %-99 %] 99 % (06/03 0438) Last BM Date: 07/17/15  General: Alert, well-developed, in NAD Heart:  Regular rate and rhythm; no murmurs Chest: Clear to ascultation bilaterally Abdomen:  Soft, nontender and nondistended. Normal bowel sounds, without guarding, and without rebound.   Extremities:  Without edema. Neurologic:  Alert and  oriented x4; grossly normal neurologically. Psych:  Alert and cooperative. Normal mood and affect.  Intake/Output from previous day: 06/02 0701 - 06/03 0700 In: 1240 [P.O.:320; I.V.:920] Out: 1900 [Urine:1900] Intake/Output this shift: Total I/O In: 100 [P.O.:100] Out: -   Lab Results:  Recent Labs  07/15/15 0440 07/16/15 0025  07/16/15 2132 07/17/15 0144 07/17/15 0538  WBC 9.4 10.3  --   --   --  6.6  HGB 9.1* 8.7*  < > 8.7* 8.2* 8.0*  HCT 27.9* 26.0*  < > 26.7* 25.0* 24.4*  PLT 192 220  --   --   --  184  < > = values in this interval not displayed. BMET  Recent Labs  07/15/15 1601 07/16/15 0025 07/17/15 0538  NA 135 132* 136  K 4.9 4.6 3.9  CL 104 109 107  CO2 _0 GLUCOSE 186* 155* 127*  BUN 23* 23* 22*  CREATININE 1.25* 1.16* 1.15*  CALCIUM 7.2* 7.1* 7.3*   LFT No results for input(s): PROT, ALBUMIN, AST, ALT, ALKPHOS, BILITOT, BILIDIR, IBILI in the last 72 hours. PT/INR  Recent Labs  07/15/15 1601  LABPROT 15.1  INR 1.22      Assessment & Plan   1. New dx of ulcerative colitis - fairly severe on flex 5/23. ESR has decreased to 50; CRP has decreased to 5.8. Has responded to steroids but continues to have bloody diarrhea with bleeding increased  since starting anticoagulation. Start Remicade during this admission She states she has had Influenza vaccine, and has had pneumovax Quantiferon gold pending Hepatitis serologies negative  2. Anemia - secondary to blood loss - drifting since addition of anticoagualtion. Transfuse for Hb < 8.   3. AKI- resolved  Principal Problem:   Colitis, nonspecific Active Problems:   Anxiety state   GERD   Diverticulosis of large intestine   AKI (acute kidney injury) ([place])   Anemia due to GI blood loss   Diarrhea in adult patient   Hypovolemia dehydration   Absolute anemia   Acute lower GI bleeding   Ulcerative pancolitis with rectal bleeding ([place])    LOS: 8 days   Kerrigan Glendening T. Fuller Plan MD 07/17/2015, 12:08 PM [phone] 8a-5p weekdays [phone] after 5p, weekends, holidays

## 2015-07-17 NOTE — Progress Notes (Signed)
ANTICOAGULATION CONSULT NOTE - f/u Consult  Pharmacy Consult for Heparin Indication: DVT  Allergies  Allergen Reactions  . Codeine Nausea And Vomiting    Pt is unable to recall all reactions to codeine  . Meloxicam Other (See Comments)    Due to stage III kidney disease  . Penicillins     Has patient had a PCN reaction causing immediate rash, facial/tongue/throat swelling, SOB or lightheadedness with hypotension: unknown Has patient had a PCN reaction causing severe rash involving mucus membranes or skin necrosis: unknown Has patient had a PCN reaction that required hospitalization: yes, drs visit Has patient had a PCN reaction occurring within the last 10 years: yes If all of the above answers are "NO", then may proceed with Cephalosporin use.   Marland Kitchen Propoxyphene Hcl Nausea And Vomiting  . Tramadol Nausea Only    jittery    Patient Measurements: Height: 5\' 3"  (160 cm) Weight: 250 lb 7.1 oz (113.6 kg) IBW/kg (Calculated) : 52.4 Heparin Dosing Weight: 79.9  Vital Signs: Temp: 98.4 F (36.9 C) (06/03 1320) Temp Source: Oral (06/03 1320) BP: 142/53 mmHg (06/03 1320) Pulse Rate: 83 (06/03 1320)  Labs:  Recent Labs  07/15/15 0440 07/15/15 1601  07/16/15 0025  07/16/15 1046  07/17/15 0144 07/17/15 0538 07/17/15 1643  HGB 9.1*  --   --  8.7*  < >  --   < > 8.2* 8.0* 8.4*  HCT 27.9*  --   --  26.0*  < >  --   < > 25.0* 24.4* 25.4*  PLT 192  --   --  220  --   --   --   --  184  --   APTT  --  26  --   --   --   --   --   --   --   --   LABPROT  --  15.1  --   --   --   --   --   --   --   --   INR  --  1.22  --   --   --   --   --   --   --   --   HEPARINUNFRC  --   --   < > 0.54  --  0.48  --   --  0.70 0.56  CREATININE  --  1.25*  --  1.16*  --   --   --   --  1.15*  --   < > = values in this interval not displayed.  Estimated Creatinine Clearance: 56.8 mL/min (by C-G formula based on Cr of 1.15).   Medical History: Past Medical History  Diagnosis Date  . Thyroid  disease   . Hypercholesterolemia   . Arthritis   . GERD (gastroesophageal reflux disease)   . Diverticulitis   . Renal insufficiency     stage 3 kidney disease  . Sleep apnea     occasionally wears a c-pap  . Anxiety   . Osteoporosis   . Ulcerative colitis (HCC)     Medications:  Scheduled:  . ALPRAZolam  0.25 mg Oral Daily  . cholecalciferol  5,000 Units Oral q morning - 10a  . dicyclomine  10 mg Oral TID AC  . feeding supplement (PRO-STAT SUGAR FREE 64)  30 mL Oral BID  . ferrous sulfate  325 mg Oral BID WC  . folic acid  1 mg Oral Daily  . insulin aspart  0-20 Units  Subcutaneous Q4H  . levothyroxine  100 mcg Oral QAC breakfast  . mesalamine  4 g Rectal BID  . methylPREDNISolone (SOLU-MEDROL) injection  60 mg Intravenous Daily  . ondansetron  4 mg Intravenous BID  . pantoprazole  40 mg Oral Daily  . simvastatin  20 mg Oral q1800  . sodium chloride flush  10-40 mL Intracatheter Q12H   Infusions:  . sodium chloride 100 mL/hr at 07/17/15 1846  . heparin 1,300 Units/hr (07/17/15 0842)   PRN: acetaminophen **OR** acetaminophen, albuterol, menthol-cetylpyridinium, morphine injection, ondansetron **OR** ondansetron (ZOFRAN) IV, oxyCODONE, sodium chloride flush, traZODone  Assessment: 70 yo female with new diagnosis of suspected ulcerative colitis.  She had a PICC placed for TPN 5/28-5/30.  Right upper extremity doppler completed which showed a partially occlusive DVT in the axillary vein felt related to PICC.  Pharmacy is consulted to dose IV heparin.  Significant events: 6/2: Small amts blood noted in BMs overnight 6/3: Bloody diarrhea persists, increased since anticoagulation started per GI notes  07/17/2015 Heparin level near therapeutic this afternoon @ 0.56. RN notes that pt reports continued bloody stools.  Hgb low, stable from this morning.  Plts WNL.   Goal of Therapy: Heparin level 0.3-0.5 units/ml Monitor platelets by anticoagulation protocol: Yes  Plan: Reduce  heparin infusion slightly to 1250 units/hr = 12.5 ml/hr  Recheck HL in 6 hours  Monitor closely for signs of bleeding or thrombosis.   Haynes Hoehn, PharmD, BCPS 07/17/2015, 6:51 PM  Pager: [phone]

## 2015-07-17 NOTE — Progress Notes (Signed)
ANTICOAGULATION CONSULT NOTE - f/u Consult  Pharmacy Consult for Heparin Indication: DVT  Allergies  Allergen Reactions  . Codeine Nausea And Vomiting    Pt is unable to recall all reactions to codeine  . Meloxicam Other (See Comments)    Due to stage III kidney disease  . Penicillins     Has patient had a PCN reaction causing immediate rash, facial/tongue/throat swelling, SOB or lightheadedness with hypotension: unknown Has patient had a PCN reaction causing severe rash involving mucus membranes or skin necrosis: unknown Has patient had a PCN reaction that required hospitalization: yes, drs visit Has patient had a PCN reaction occurring within the last 10 years: yes If all of the above answers are "NO", then may proceed with Cephalosporin use.   Marland Kitchen Propoxyphene Hcl Nausea And Vomiting  . Tramadol Nausea Only    jittery    Patient Measurements: Height:  (160 cm) Weight: 250 lb 7.1 oz (113.6 kg) IBW/kg (Calculated) : 52.4 Heparin Dosing Weight: 79.9  Vital Signs: Temp: 98.7 F (37.1 C) (06/03 0438) Temp Source: Oral (06/03 0438) BP: 141/65 mmHg (06/03 0438) Pulse Rate: 71 (06/03 0438)  Labs:  Recent Labs  07/15/15 0440 07/15/15 1601 07/16/15 0025  07/16/15 1046 07/16/15 2132 07/17/15 0144 07/17/15 0538  HGB 9.1*  --  8.7*  < >  --  8.7* 8.2* 8.0*  HCT 27.9*  --  26.0*  < >  --  26.7* 25.0* 24.4*  PLT 192  --  220  --   --   --   --  184  APTT  --  26  --   --   --   --   --   --   LABPROT  --  15.1  --   --   --   --   --   --   INR  --  1.22  --   --   --   --   --   --   HEPARINUNFRC  --   --  0.54  --  0.48  --   --  0.70  CREATININE  --  1.25* 1.16*  --   --   --   --   --   < > = values in this interval not displayed.  Estimated Creatinine Clearance: 56.3 mL/min (by C-G formula based on Cr of 1.16).   Medical History: Past Medical History  Diagnosis Date  . Thyroid disease   . Hypercholesterolemia   . Arthritis   . GERD (gastroesophageal  reflux disease)   . Diverticulitis   . Renal insufficiency     stage 3 kidney disease  . Sleep apnea     occasionally wears a c-pap  . Anxiety   . Osteoporosis   . Ulcerative colitis ([facility])     Medications:  Scheduled:  . ALPRAZolam  0.25 mg Oral Daily  . cholecalciferol  5,000 Units Oral q morning - 10a  . dicyclomine  10 mg Oral TID AC  . feeding supplement (PRO-STAT SUGAR FREE 64)  30 mL Oral BID  . ferrous sulfate  325 mg Oral BID WC  . folic acid  1 mg Oral Daily  . insulin aspart  0-20 Units Subcutaneous Q4H  . levothyroxine  100 mcg Oral QAC breakfast  . mesalamine  4 g Rectal BID  . methylPREDNISolone (SOLU-MEDROL) injection  60 mg Intravenous Daily  . ondansetron  4 mg Intravenous BID  . pantoprazole  40 mg Oral Daily  .  simvastatin  20 mg Oral q1800  . sodium chloride flush  10-40 mL Intracatheter Q12H   Infusions:  . sodium chloride 100 mL/hr at 07/16/15 1248  . heparin     PRN: acetaminophen **OR** acetaminophen, albuterol, menthol-cetylpyridinium, morphine injection, ondansetron **OR** ondansetron (ZOFRAN) IV, oxyCODONE, sodium chloride flush, traZODone  Assessment: 70 yo female with new diagnosis of suspected ulcerative colitis.  She had a PICC placed for TPN 5/28-5/30.  Right upper extremity doppler completed which showed a partially occlusive DVT in the axillary vein felt related to PICC.  Pharmacy is consulted to dose IV heparin.   Baseline INR, aPTT: wnl  Prior anticoagulation: none  Significant events: 6/2: small amts blood noted in BMs overnight  07/16/15  CBC: Hgb lower, Plt wnl  Most recent heparin level therapeutic on 1400 units/hr  Patient has chronically bloody stool with colitis, but only to a minor degree.  Patient hasn't noticed much difference since starting heparin, but RN thinks bleeding may have worsened.  CrCl: 56 ml/min, 52 ml/min normalized Today, 6/3  0538 HL=0.70, no infusion problems, but RN reports bleeding worse.  Goal of  Therapy: Heparin level 0.3-0.5 units/ml Monitor platelets by anticoagulation protocol: Yes  Plan:  Will decrease heparin drip to 1300 units/hr b/c at higher end of therapeutic range.    Since clot seems to be PICC-related, and given worsened blood in stool, will aim for lower end of therapeutic range.  Daily CBC, heparin level  Monitor for signs of bleeding or thrombosis.     Jamie Wood 07/17/2015, 6:23 AM

## 2015-07-17 NOTE — Progress Notes (Signed)
Pt refuses CPAP, RT to monitor and assess as needed.  

## 2015-07-18 LAB — CBC
HCT: 20.9 % — ABNORMAL LOW (ref 36.0–46.0)
Hemoglobin: 6.8 g/dL — CL (ref 12.0–15.0)
MCH: 30.4 pg (ref 26.0–34.0)
MCHC: 32.5 g/dL (ref 30.0–36.0)
MCV: 93.3 fL (ref 78.0–100.0)
PLATELETS: 207 10*3/uL (ref 150–400)
RBC: 2.24 MIL/uL — AB (ref 3.87–5.11)
RDW: 15.3 % (ref 11.5–15.5)
WBC: 8.7 10*3/uL (ref 4.0–10.5)

## 2015-07-18 LAB — GLUCOSE, CAPILLARY
GLUCOSE-CAPILLARY: 124 mg/dL — AB (ref 65–99)
GLUCOSE-CAPILLARY: 130 mg/dL — AB (ref 65–99)
GLUCOSE-CAPILLARY: 134 mg/dL — AB (ref 65–99)
GLUCOSE-CAPILLARY: 180 mg/dL — AB (ref 65–99)
Glucose-Capillary: 117 mg/dL — ABNORMAL HIGH (ref 65–99)
Glucose-Capillary: 106 mg/dL — ABNORMAL HIGH (ref 65–99)

## 2015-07-18 LAB — HEMOGLOBIN AND HEMATOCRIT, BLOOD
HCT: 28.6 % — ABNORMAL LOW (ref 36.0–46.0)
HEMOGLOBIN: 9.7 g/dL — AB (ref 12.0–15.0)

## 2015-07-18 LAB — PREPARE RBC (CROSSMATCH)

## 2015-07-18 LAB — HEPARIN LEVEL (UNFRACTIONATED): HEPARIN UNFRACTIONATED: 0.52 [IU]/mL (ref 0.30–0.70)

## 2015-07-18 MED ORDER — HEPARIN (PORCINE) IN NACL 100-0.45 UNIT/ML-% IJ SOLN
1200.0000 [IU]/h | INTRAMUSCULAR | Status: DC
  Filled 2015-07-18: qty 250

## 2015-07-18 MED ORDER — SODIUM CHLORIDE 0.9 % IV SOLN: Freq: Once | INTRAVENOUS | Status: DC

## 2015-07-18 MED ORDER — PRO-STAT SUGAR FREE PO LIQD
30.0000 mL | Freq: Three times a day (TID) | ORAL | Status: DC
  Administered 2015-07-18 – 2015-07-26 (×25): 30 mL via ORAL
  Filled 2015-07-18 (×28): qty 30

## 2015-07-18 MED ORDER — FUROSEMIDE 10 MG/ML IJ SOLN
40.0000 mg | Freq: Once | INTRAMUSCULAR | Status: AC
  Administered 2015-07-18: 40 mg via INTRAVENOUS
  Filled 2015-07-18: qty 4

## 2015-07-18 NOTE — Progress Notes (Signed)
Reported to physician that patient had a bloody urine/stool mix.  Physician ordering another unit of PRBC to be transfused.

## 2015-07-18 NOTE — Progress Notes (Signed)
PROGRESS NOTE    Jamie SALONGA  Wood:[id number] DOB: 10/16/1945 DOA: 07/09/2015  PCP: Cala Bradford, MD   Brief Narrative:  Jamie Wood is an 70 y.o. female past medical history no GI discomfort with a recent endoscopy and 07/03/2015 with biopsies revealing moderate inflammation in rectum and sigmoid areas. Treated empirically with Lialda and Rowasa enema. She did not improve and was started on Prednisone 40 mg daily. She followed up with GI and mentioned dizziness and ongoing loose stools and was sent to the ED and admitted for IV hydration, IV antibiotics and placed on IV steroids by GI and Imodium.   Subjective: Continues to have bloody BMs. Abdominal spasms this AM on an empty stomach.   Assessment & Plan:   Principal Problem:   Colitis, inflammatory- GI bleed- anemia due to acute blood loss - biopsies done on colonoscopy on 5/23 as mentioned showed inflammation - GI suspecting UC- cont steroids per GI- antibiotics stopped after 5 days- c diff and GI pathogen panel negative -follow diarrhea - TNA was being given but now stopped  - quantiferon gold indetermine - hepatitis serologies negative  - prednisone started with hopes to d/c home however discovered RUE DVTand she was started on Heparin and switched back to IV steroids due to worsening bleeding- GI planning to start Remicade as soon a quantiferron gold returns negative   - following for futher bloody stools- Hb dropping to 6.9- transfuse 1 U PRBC- received 4 U PRBC already this admission-    Active Problems:  DVT right arm associated with PICC line - axillary DVT  - Heparin infusion stopped this AM - PICC removed   Hyponatremia/ orthostatic hypotension/ Dehydration/ CKD 3 - Cr 1.2   >> now 1.1 (baseline)  - resume IVF as sodium low and still having large amount of diarrhea  Moderate protein calorie malnutrition with anasarca - TNA stopped- follow PO intake- increase Prostat to TID - give 1 dose of Lasix 40 mg IV  today   DVT prophylaxis: SCDs Code Status: full code Family Communication:  Disposition Plan: home when bloodly stools resolve Consultants:   GI Procedures:   Colonoscopy prior to this admission Antimicrobials:  Anti-infectives    Start     Dose/Rate Route Frequency Ordered Stop   07/10/15 0330  metroNIDAZOLE (FLAGYL) IVPB 500 mg  Status:  Discontinued     500 mg 100 mL/hr over 60 Minutes Intravenous Every 8 hours 07/10/15 0241 07/14/15 0914   07/10/15 0300  ciprofloxacin (CIPRO) IVPB 400 mg  Status:  Discontinued     400 mg 200 mL/hr over 60 Minutes Intravenous 2 times daily 07/10/15 0241 07/14/15 0914       Objective: Filed Vitals:   07/17/15 2137 07/18/15 0522 07/18/15 0930 07/18/15 0957  BP: 131/46 144/47 114/35 111/43  Pulse: 80 74 97 98  Temp: 98.6 F (37 C) 98 F (36.7 C) 98.1 F (36.7 C) 97.7 F (36.5 C)  TempSrc: Oral Oral Oral Oral  Resp: 18 18 18 18   Height:      Weight:      SpO2: 98% 100% 97% 92%    Intake/Output Summary (Last 24 hours) at 07/18/15 1044 Last data filed at 07/18/15 0900  Gross per 24 hour  Intake 1962.46 ml  Output   1390 ml  Net 572.46 ml   Filed Weights   07/09/15 1338 07/12/15 0557  Weight: 104.327 kg (230 lb) 113.6 kg (250 lb 7.1 oz)    Examination: General exam: Appears comfortable  HEENT: PERRLA, oral mucosa moist, no sclera icterus or thrush Respiratory system: Clear to auscultation. Respiratory effort normal. Cardiovascular system: S1 & S2 heard, RRR.  No murmurs  Gastrointestinal system: Abdomen soft, non-tender, nondistended. Normal bowel sound. No organomegaly Central nervous system: Alert and oriented. No focal neurological deficits. Extremities: No cyanosis, clubbing - significant peripheral edema Skin: No rashes or ulcers Psychiatry:  Mood & affect appropriate.     Data Reviewed: I have personally reviewed following labs and imaging studies  CBC:  Recent Labs Lab 07/12/15 0755  07/14/15 0458  07/15/15 0440 07/16/15 0025  07/17/15 0144 07/17/15 0538 07/17/15 1643 07/17/15 2159 07/18/15 0619  WBC 15.7*  < > 13.1* 9.4 10.3  --   --  6.6  --   --  8.7  NEUTROABS 13.5*  --   --   --   --   --   --   --   --   --   --   HGB 10.7*  < > 10.2* 9.1* 8.7*  < > 8.2* 8.0* 8.4* 7.9* 6.8*  HCT 31.1*  < > 30.4* 27.9* 26.0*  < > 25.0* 24.4* 25.4* 23.9* 20.9*  MCV 86.4  < > 91.3 92.7 89.7  --   --  92.1  --   --  93.3  PLT 318  < > 272 192 220  --   --  184  --   --  207  < > = values in this interval not displayed. Basic Metabolic Panel:  Recent Labs Lab 07/12/15 0755 07/13/15 0545 07/15/15 1601 07/16/15 0025 07/17/15 0538  NA 135 133* 135 132* 136  K 4.1 4.3 4.9 4.6 3.9  CL 106 104 104 109 107  CO2 23 25 26 24 25   GLUCOSE 201* 167* 186* 155* 127*  BUN 17 19 23* 23* 22*  CREATININE 1.23* 1.22* 1.25* 1.16* 1.15*  CALCIUM 7.6* 7.6* 7.2* 7.1* 7.3*  MG 2.0 1.9  --   --   --   PHOS 3.0 3.2  --   --   --    GFR: Estimated Creatinine Clearance: 56.8 mL/min (by C-G formula based on Cr of 1.15). Liver Function Tests:  Recent Labs Lab 07/12/15 0755 07/13/15 0545  AST 22 19  ALT 21 17  ALKPHOS 55 53  BILITOT 0.3 0.2*  PROT 4.6* 4.5*  ALBUMIN 1.7* 1.7*   No results for input(s): LIPASE, AMYLASE in the last 168 hours. No results for input(s): AMMONIA in the last 168 hours. Coagulation Profile:  Recent Labs Lab 07/15/15 1601  INR 1.22   Cardiac Enzymes: No results for input(s): CKTOTAL, CKMB, CKMBINDEX, TROPONINI in the last 168 hours. BNP (last 3 results) No results for input(s): PROBNP in the last 8760 hours. HbA1C: No results for input(s): HGBA1C in the last 72 hours. CBG:  Recent Labs Lab 07/17/15 1646 07/17/15 2048 07/18/15 0035 07/18/15 0341 07/18/15 0737  GLUCAP 248* 231* 130* 117* 106*   Lipid Profile: No results for input(s): CHOL, HDL, LDLCALC, TRIG, CHOLHDL, LDLDIRECT in the last 72 hours. Thyroid Function Tests: No results for input(s): TSH,  T4TOTAL, FREET4, T3FREE, THYROIDAB in the last 72 hours. Anemia Panel: No results for input(s): VITAMINB12, FOLATE, FERRITIN, TIBC, IRON, RETICCTPCT in the last 72 hours. Urine analysis:    Component Value Date/Time   COLORURINE AMBER* 07/10/2015 2016   APPEARANCEUR CLOUDY* 07/10/2015 2016   LABSPEC 1.018 07/10/2015 2016   PHURINE 6.0 07/10/2015 2016   GLUCOSEU NEGATIVE 07/10/2015 2016   HGBUR  NEGATIVE 07/10/2015 2016   BILIRUBINUR NEGATIVE 07/10/2015 2016   KETONESUR NEGATIVE 07/10/2015 2016   PROTEINUR NEGATIVE 07/10/2015 2016   NITRITE NEGATIVE 07/10/2015 2016   LEUKOCYTESUR NEGATIVE 07/10/2015 2016   Sepsis Labs: (procalcitonin:4,lacticidven:4)    Recent Results (from the past 240 hour(s))  MRSA PCR Screening     Status: None   Collection Time: 07/09/15  1:36 PM  Result Value Ref Range Status   MRSA by PCR NEGATIVE NEGATIVE Final    Comment:        The GeneXpert MRSA Assay (FDA approved for NASAL specimens only), is one component of a comprehensive MRSA colonization surveillance program. It is not intended to diagnose MRSA infection nor to guide or monitor treatment for MRSA infections.   C difficile quick scan w PCR reflex     Status: None   Collection Time: 07/10/15  2:08 AM  Result Value Ref Range Status   C Diff antigen NEGATIVE NEGATIVE Final   C Diff toxin NEGATIVE NEGATIVE Final   C Diff interpretation Negative for toxigenic C. difficile  Final  Gastrointestinal Panel by PCR , Stool     Status: None   Collection Time: 07/10/15  2:08 AM  Result Value Ref Range Status   Campylobacter species NOT DETECTED NOT DETECTED Final   Plesimonas shigelloides NOT DETECTED NOT DETECTED Final   Salmonella species NOT DETECTED NOT DETECTED Final   Yersinia enterocolitica NOT DETECTED NOT DETECTED Final   Vibrio species NOT DETECTED NOT DETECTED Final   Vibrio cholerae NOT DETECTED NOT DETECTED Final   Enteroaggregative E coli (EAEC) NOT DETECTED NOT DETECTED  Final   Enteropathogenic E coli (EPEC) NOT DETECTED NOT DETECTED Final   Enterotoxigenic E coli (ETEC) NOT DETECTED NOT DETECTED Final   Shiga like toxin producing E coli (STEC) NOT DETECTED NOT DETECTED Final   E. coli O157 NOT DETECTED NOT DETECTED Final   Shigella/Enteroinvasive E coli (EIEC) NOT DETECTED NOT DETECTED Final   Cryptosporidium NOT DETECTED NOT DETECTED Final   Cyclospora cayetanensis NOT DETECTED NOT DETECTED Final   Entamoeba histolytica NOT DETECTED NOT DETECTED Final   Giardia lamblia NOT DETECTED NOT DETECTED Final   Adenovirus F40/41 NOT DETECTED NOT DETECTED Final   Astrovirus NOT DETECTED NOT DETECTED Final   Norovirus GI/GII NOT DETECTED NOT DETECTED Final   Rotavirus A NOT DETECTED NOT DETECTED Final   Sapovirus (I, II, IV, and V) NOT DETECTED NOT DETECTED Final     Radiology Studies: No results found.  Scheduled Meds: . sodium chloride   Intravenous Once  . ALPRAZolam  0.25 mg Oral Daily  . cholecalciferol  5,000 Units Oral q morning - 10a  . dicyclomine  10 mg Oral TID AC  . feeding supplement (PRO-STAT SUGAR FREE 64)  30 mL Oral TID  . ferrous sulfate  325 mg Oral BID WC  . folic acid  1 mg Oral Daily  . furosemide  40 mg Intravenous Once  . insulin aspart  0-20 Units Subcutaneous Q4H  . levothyroxine  100 mcg Oral QAC breakfast  . mesalamine  4 g Rectal BID  . methylPREDNISolone (SOLU-MEDROL) injection  60 mg Intravenous Daily  . ondansetron  4 mg Intravenous BID  . pantoprazole  40 mg Oral Daily  . simvastatin  20 mg Oral q1800  . sodium chloride flush  10-40 mL Intracatheter Q12H   Continuous Infusions:     LOS: 9 days    Time spent in minutes: 35    Levonte Molina, MD [facility] Pager: [email or web address] Password [email or web address] 07/18/2015, 10:44 AM

## 2015-07-18 NOTE — Progress Notes (Signed)
ANTICOAGULATION CONSULT NOTE - f/u Consult  Pharmacy Consult for Heparin Indication: DVT  Allergies  Allergen Reactions  . Codeine Nausea And Vomiting    Pt is unable to recall all reactions to codeine  . Meloxicam Other (See Comments)    Due to stage III kidney disease  . Penicillins     Has patient had a PCN reaction causing immediate rash, facial/tongue/throat swelling, SOB or lightheadedness with hypotension: unknown Has patient had a PCN reaction causing severe rash involving mucus membranes or skin necrosis: unknown Has patient had a PCN reaction that required hospitalization: yes, drs visit Has patient had a PCN reaction occurring within the last 10 years: yes If all of the above answers are "NO", then may proceed with Cephalosporin use.   Marland Kitchen Propoxyphene Hcl Nausea And Vomiting  . Tramadol Nausea Only    jittery    Patient Measurements: Height:  (160 cm) Weight: 250 lb 7.1 oz (113.6 kg) IBW/kg (Calculated) : 52.4 Heparin Dosing Weight: 79.9  Vital Signs: Temp: 98.6 F (37 C) (06/03 2137) Temp Source: Oral (06/03 2137) BP: 131/46 mmHg (06/03 2137) Pulse Rate: 80 (06/03 2137)  Labs:  Recent Labs  07/15/15 0440 07/15/15 1601 07/16/15 0025  07/17/15 0538 07/17/15 1242 07/17/15 1643 07/17/15 2159 07/18/15 0103  HGB 9.1*  --  8.7*  < > 8.0*  --  8.4* 7.9*  --   HCT 27.9*  --  26.0*  < > 24.4*  --  25.4* 23.9*  --   PLT 192  --  220  --  184  --   --   --   --   APTT  --  26  --   --   --   --   --   --   --   LABPROT  --  15.1  --   --   --   --   --   --   --   INR  --  1.22  --   --   --   --   --   --   --   HEPARINUNFRC  --   --  0.54  < > 0.70 0.41 0.56  --  0.52  CREATININE  --  1.25* 1.16*  --  1.15*  --   --   --   --   < > = values in this interval not displayed.  Estimated Creatinine Clearance: 56.8 mL/min (by C-G formula based on Cr of 1.15).   Medical History: Past Medical History  Diagnosis Date  . Thyroid disease   .  Hypercholesterolemia   . Arthritis   . GERD (gastroesophageal reflux disease)   . Diverticulitis   . Renal insufficiency     stage 3 kidney disease  . Sleep apnea     occasionally wears a c-pap  . Anxiety   . Osteoporosis   . Ulcerative colitis (HCC)     Medications:  Scheduled:  . ALPRAZolam  0.25 mg Oral Daily  . cholecalciferol  5,000 Units Oral q morning - 10a  . dicyclomine  10 mg Oral TID AC  . feeding supplement (PRO-STAT SUGAR FREE 64)  30 mL Oral BID  . ferrous sulfate  325 mg Oral BID WC  . folic acid  1 mg Oral Daily  . insulin aspart  0-20 Units Subcutaneous Q4H  . levothyroxine  100 mcg Oral QAC breakfast  . mesalamine  4 g Rectal BID  . methylPREDNISolone (SOLU-MEDROL) injection  60  mg Intravenous Daily  . ondansetron  4 mg Intravenous BID  . pantoprazole  40 mg Oral Daily  . simvastatin  20 mg Oral q1800  . sodium chloride flush  10-40 mL Intracatheter Q12H   Infusions:  . sodium chloride 100 mL/hr at 07/17/15 1846  . heparin     PRN: acetaminophen **OR** acetaminophen, albuterol, menthol-cetylpyridinium, morphine injection, ondansetron **OR** ondansetron (ZOFRAN) IV, oxyCODONE, sodium chloride flush, traZODone  Assessment: 70 yo female with new diagnosis of suspected ulcerative colitis.  She had a PICC placed for TPN 5/28-5/30.  Right upper extremity doppler completed which showed a partially occlusive DVT in the axillary vein felt related to PICC.  Pharmacy is consulted to dose IV heparin.  Significant events: 6/2: Small amts blood noted in BMs overnight 6/3: Bloody diarrhea persists, increased since anticoagulation started per GI notes Today, 6/4  0103 HL=0.52 , no infusion problems, x4 bloody stools per RN  Goal of Therapy: Heparin level 0.3-0.5 units/ml Monitor platelets by anticoagulation protocol: Yes  Plan: Reduce heparin infusion slightly to 1200 units/hr = 12 ml/hr  Recheck HL in 6 hours  Monitor closely for signs of bleeding or  thrombosis.    Jamie Wood 2:34 AM 07/18/2015

## 2015-07-18 NOTE — Progress Notes (Signed)
CRITICAL VALUE ALERT  Critical value received:  HBG 6.8           Date of notification:  07/18/2015     Time of notification: 0646          Critical value read back:Yes.     Nurse who    Carma Leaven  MD notified (1st page):  Claiborne Billings, texted paged  Time of first page:  [phone]  MD notified (2nd page):  Time of second page:  Responding MD:    Time MD responded:  **

## 2015-07-18 NOTE — Progress Notes (Signed)
Pt refuses CPAP QHS, RT to monitor and assess as needed.  

## 2015-07-19 ENCOUNTER — Inpatient Hospital Stay (HOSPITAL_COMMUNITY): Payer: Medicare Other

## 2015-07-19 DIAGNOSIS — E860 Dehydration: Secondary | ICD-10-CM | POA: Diagnosis present

## 2015-07-19 LAB — BASIC METABOLIC PANEL
ANION GAP: 5 (ref 5–15)
BUN: 24 mg/dL — ABNORMAL HIGH (ref 6–20)
CALCIUM: 7.4 mg/dL — AB (ref 8.9–10.3)
CO2: 24 mmol/L (ref 22–32)
CREATININE: 1.13 mg/dL — AB (ref 0.44–1.00)
Chloride: 107 mmol/L (ref 101–111)
GFR, EST AFRICAN AMERICAN: 57 mL/min — AB (ref >60)
GFR, EST NON AFRICAN AMERICAN: 49 mL/min — AB (ref >60)
Glucose, Bld: 120 mg/dL — ABNORMAL HIGH (ref 65–99)
Potassium: 3.9 mmol/L (ref 3.5–5.1)
SODIUM: 136 mmol/L (ref 135–145)

## 2015-07-19 LAB — HEMOGLOBIN AND HEMATOCRIT, BLOOD
HCT: 25.9 % — ABNORMAL LOW (ref 36.0–46.0)
HEMATOCRIT: 27.2 % — AB (ref 36.0–46.0)
HEMATOCRIT: 26.7 % — AB (ref 36.0–46.0)
HEMOGLOBIN: 8.8 g/dL — AB (ref 12.0–15.0)
HEMOGLOBIN: 9.4 g/dL — AB (ref 12.0–15.0)
HEMOGLOBIN: 9.3 g/dL — AB (ref 12.0–15.0)

## 2015-07-19 LAB — TYPE AND SCREEN
ABO/RH(D): A POS
Antibody Screen: NEGATIVE
UNIT DIVISION: 0
UNIT DIVISION: 0

## 2015-07-19 LAB — GLUCOSE, CAPILLARY
GLUCOSE-CAPILLARY: 125 mg/dL — AB (ref 65–99)
GLUCOSE-CAPILLARY: 187 mg/dL — AB (ref 65–99)
Glucose-Capillary: 148 mg/dL — ABNORMAL HIGH (ref 65–99)
Glucose-Capillary: 105 mg/dL — ABNORMAL HIGH (ref 65–99)
Glucose-Capillary: 93 mg/dL (ref 65–99)
Glucose-Capillary: 146 mg/dL — ABNORMAL HIGH (ref 65–99)

## 2015-07-19 MED ORDER — LACTATED RINGERS IV SOLN
INTRAVENOUS | Status: DC
  Administered 2015-07-19 – 2015-07-20 (×2): via INTRAVENOUS

## 2015-07-19 MED ORDER — POLYETHYLENE GLYCOL 3350 17 GM/SCOOP PO POWD
1.0000 | Freq: Once | ORAL | Status: DC
  Filled 2015-07-19: qty 255

## 2015-07-19 MED ORDER — METOCLOPRAMIDE HCL 5 MG/ML IJ SOLN
10.0000 mg | Freq: Once | INTRAMUSCULAR | Status: AC
  Administered 2015-07-19: 10 mg via INTRAVENOUS
  Filled 2015-07-19: qty 2

## 2015-07-19 MED ORDER — SODIUM CHLORIDE 0.9 % IV SOLN
INTRAVENOUS | Status: DC
  Administered 2015-07-19 – 2015-07-20 (×2): via INTRAVENOUS

## 2015-07-19 MED ORDER — TUBERCULIN PPD 5 UNIT/0.1ML ID SOLN
5.0000 [IU] | Freq: Once | INTRADERMAL | Status: AC
  Administered 2015-07-19: 5 [IU] via INTRADERMAL
  Filled 2015-07-19: qty 0.1

## 2015-07-19 NOTE — Progress Notes (Signed)
Pt refused CPAP - QHS.  Education provided.  RT will continue to monitor as needed.

## 2015-07-19 NOTE — Progress Notes (Signed)
Nutrition Follow-up  DOCUMENTATION CODES:   Morbid obesity  INTERVENTION:  -Encourage PO intake -Continue Pro-Stat 39m TID, each supplement provides 100 calories and 15g protein  NUTRITION DIAGNOSIS:   Inadequate oral intake related to poor appetite as evidenced by per patient/family report. -resolving, off TPN now  GOAL:   Patient will meet greater than or equal to 90% of their needs -progressing  MONITOR:   PO intake, Labs, Weight trends, I & O's, Other (Comment) (TPN)  REASON FOR ASSESSMENT:   Consult New TPN/TNA  ASSESSMENT:   70y.o. female past medical history no GI discomfort with a recent endoscopy and 07/03/2015 that was congestive of possible inflammatory colitis however biopsies are not revealed inflammation, patient was treated empirically with mesalamine steroids, and previous GI pathogen was unremarkable the comes into the ED complaining of abdominal pain and bloody diarrhea for a month which has progressively gotten worse now she feels dizzy and lightheadedness, and ED was found to have a drop in the hemoglobin mild increase in her creatinine.  Spoke with Ms. Devenport briefly Her TPN was stopped 5/29, since then, PO has improved -> Currently tolerating clear liquids ok, which she doesn't really enjoy but had chicken broth, apple juice and coffee this morning that she states she ate about half of. Documented PO intake averaging 67% over last 8 meals.  No nausea/vomiting at this time. Wt is up 10kg since admission, no new wts since 5/29 -> exhibiting anasarca.  Per MD note, patient continues to have bloody/watery bowel movements. She was to d/c home but RUE DVT was found.  Labs and Medications reviewed: BUN 24, Cr 1.13, EGFR 49; CA 7.4; Vit D 5000iU PO daily; Folic Acid 13mPO Daily; Solumedrol 6038maily; NS 56m70m;  Diet Order:  Diet clear liquid Room service appropriate?: Yes; Fluid consistency:: Thin Diet NPO time specified Except for: Sips with  Meds  Skin:  Reviewed, no issues  Last BM:  5/28  Height:   Ht Readings from Last 1 Encounters:  07/11/15 _0  (1.6 m)    Weight:   Wt Readings from Last 1 Encounters:  07/12/15 250 lb 7.1 oz (113.6 kg)    Ideal Body Weight:  54.5 kg  BMI:  Body mass index is 44.38 kg/(m^2).  Estimated Nutritional Needs:   Kcal:  2000-2200  Protein:  80-90g  Fluid:  2L/day  EDUCATION NEEDS:   No education needs identified at this time  WillSatira Anisrd, MS, RD LDN Inpatient Clinical Dietitian Pager [phone]

## 2015-07-19 NOTE — Progress Notes (Signed)
PROGRESS NOTE    Jamie Wood  ZOX:[id number] DOB: [birth year] DOA: 07/09/2015  PCP: Cala Bradford, MD   Brief Narrative:  Jamie Wood is an 70 y.o. female past medical history no GI discomfort with a recent endoscopy and 07/03/2015 with biopsies revealing moderate inflammation in rectum and sigmoid areas. Treated empirically with Lialda and Rowasa enema. She did not improve and was started on Prednisone 40 mg daily. She followed up with GI and mentioned dizziness and ongoing loose stools and was sent to the ED and admitted for IV hydration, IV antibiotics and placed on IV steroids by GI and Imodium.   Subjective: Continues to have bloody BMs. Abdominal spasms this AM on an empty stomach.   Assessment & Plan:   Principal Problem:   Colitis, inflammatory- GI bleed- anemia due to acute blood loss - biopsies done on colonoscopy on 5/23 as mentioned showed inflammation - GI suspecting UC- cont steroids per GI- antibiotics stopped after 5 days- c diff and GI pathogen panel negative -follow diarrhea - TNA was being given but now stopped  - quantiferon gold indetermine - hepatitis serologies negative  - prednisone started with hopes to d/c home however discovered RUE DVTand she was started on Heparin and switched back to IV steroids due to worsening bleeding- GI planning to start Remicade as soon a quantiferron gold returns negative   - following for futher bloody stools- Hb dropping to 6.9- transfused 2 U PRBC for total of 6 u PRBC-  - GI plans for PPD and repeat Colonoscopy tomorrow with further biopsies  Active Problems:  DVT right arm associated with PICC line - axillary DVT  - Heparin infusion stopped due to increase in bleeding - PICC removed   Hyponatremia/ orthostatic hypotension/ Dehydration/ CKD 3 - Cr 1.2   >> now 1.1 (baseline)  - slow NS as she has large amount of stool- follow I and O   Severe protein calorie malnutrition with anasarca - TNA stopped- follow PO intake-  increased Prostat to TID  DVT prophylaxis: SCDs Code Status: full code Family Communication:  Disposition Plan: home when bloodly stools resolve Consultants:   GI Procedures:   Colonoscopy prior to this admission Antimicrobials:  Anti-infectives    Start     Dose/Rate Route Frequency Ordered Stop   07/10/15 0330  metroNIDAZOLE (FLAGYL) IVPB 500 mg  Status:  Discontinued     500 mg 100 mL/hr over 60 Minutes Intravenous Every 8 hours 07/10/15 0241 07/14/15 0914   07/10/15 0300  ciprofloxacin (CIPRO) IVPB 400 mg  Status:  Discontinued     400 mg 200 mL/hr over 60 Minutes Intravenous 2 times daily 07/10/15 0241 07/14/15 0914       Objective: Filed Vitals:   07/18/15 1619 07/18/15 1943 07/18/15 2238 07/19/15 0533  BP: 114/55 136/64 122/70 134/76  Pulse: 87 80 72 66  Temp: 98.3 F (36.8 C) 98.5 F (36.9 C) 98.7 F (37.1 C) 97.8 F (36.6 C)  TempSrc: Oral Oral Oral Oral  Resp: 18 18 19 18   Height:      Weight:      SpO2: 99% 100% 99% 98%    Intake/Output Summary (Last 24 hours) at 07/19/15 1139 Last data filed at 07/19/15 0125  Gross per 24 hour  Intake   1160 ml  Output   2775 ml  Net  -1615 ml   Filed Weights   07/09/15 1338 07/12/15 0557  Weight: 104.327 kg (230 lb) 113.6 kg (250 lb 7.1 oz)  Examination: General exam: Appears comfortable  HEENT: PERRLA, oral mucosa moist, no sclera icterus or thrush Respiratory system: Clear to auscultation. Respiratory effort normal. Cardiovascular system: S1 & S2 heard, RRR.  No murmurs  Gastrointestinal system: Abdomen soft, non-tender, nondistended. Normal bowel sound. No organomegaly Central nervous system: Alert and oriented. No focal neurological deficits. Extremities: No cyanosis, clubbing - significant peripheral edema Skin: No rashes or ulcers Psychiatry:  Mood & affect appropriate.     Data Reviewed: I have personally reviewed following labs and imaging studies  CBC:  Recent Labs Lab 07/14/15 0458  07/15/15 0440 07/16/15 0025  07/17/15 0538 07/17/15 1643 07/17/15 2159 07/18/15 0619 07/18/15 2229 07/19/15 0615  WBC 13.1* 9.4 10.3  --  6.6  --   --  8.7  --   --   HGB 10.2* 9.1* 8.7*  < > 8.0* 8.4* 7.9* 6.8* 9.7* 8.8*  HCT 30.4* 27.9* 26.0*  < > 24.4* 25.4* 23.9* 20.9* 28.6* 25.9*  MCV 91.3 92.7 89.7  --  92.1  --   --  93.3  --   --   PLT 272 192 220  --  184  --   --  207  --   --   < > = values in this interval not displayed. Basic Metabolic Panel:  Recent Labs Lab 07/13/15 0545 07/15/15 1601 07/16/15 0025 07/17/15 0538 07/19/15 0615  NA 133* 135 132* 136 136  K 4.3 4.9 4.6 3.9 3.9  CL 104 104 109 107 107  CO2 25 26 24 25 24   GLUCOSE 167* 186* 155* 127* 120*  BUN 19 23* 23* 22* 24*  CREATININE 1.22* 1.25* 1.16* 1.15* 1.13*  CALCIUM 7.6* 7.2* 7.1* 7.3* 7.4*  MG 1.9  --   --   --   --   PHOS 3.2  --   --   --   --    GFR: Estimated Creatinine Clearance: 57.8 mL/min (by C-G formula based on Cr of 1.13). Liver Function Tests:  Recent Labs Lab 07/13/15 0545  AST 19  ALT 17  ALKPHOS 53  BILITOT 0.2*  PROT 4.5*  ALBUMIN 1.7*   No results for input(s): LIPASE, AMYLASE in the last 168 hours. No results for input(s): AMMONIA in the last 168 hours. Coagulation Profile:  Recent Labs Lab 07/15/15 1601  INR 1.22   Cardiac Enzymes: No results for input(s): CKTOTAL, CKMB, CKMBINDEX, TROPONINI in the last 168 hours. BNP (last 3 results) No results for input(s): PROBNP in the last 8760 hours. HbA1C: No results for input(s): HGBA1C in the last 72 hours. CBG:  Recent Labs Lab 07/18/15 2211 07/19/15 0204 07/19/15 0557 07/19/15 0748 07/19/15 1126  GLUCAP 180* 148* 125* 105* 93   Lipid Profile: No results for input(s): CHOL, HDL, LDLCALC, TRIG, CHOLHDL, LDLDIRECT in the last 72 hours. Thyroid Function Tests: No results for input(s): TSH, T4TOTAL, FREET4, T3FREE, THYROIDAB in the last 72 hours. Anemia Panel: No results for input(s): VITAMINB12, FOLATE,  FERRITIN, TIBC, IRON, RETICCTPCT in the last 72 hours. Urine analysis:    Component Value Date/Time   COLORURINE AMBER* 07/10/2015 2016   APPEARANCEUR CLOUDY* 07/10/2015 2016   LABSPEC 1.018 07/10/2015 2016   PHURINE 6.0 07/10/2015 2016   GLUCOSEU NEGATIVE 07/10/2015 2016   HGBUR NEGATIVE 07/10/2015 2016   BILIRUBINUR NEGATIVE 07/10/2015 2016   KETONESUR NEGATIVE 07/10/2015 2016   PROTEINUR NEGATIVE 07/10/2015 2016   NITRITE NEGATIVE 07/10/2015 2016   LEUKOCYTESUR NEGATIVE 07/10/2015 2016   Sepsis Labs: @LABRCNTIP (procalcitonin:4,lacticidven:4)  Recent Results (from the past 240 hour(s))  MRSA PCR Screening     Status: None   Collection Time: 07/09/15  1:36 PM  Result Value Ref Range Status   MRSA by PCR NEGATIVE NEGATIVE Final    Comment:        The GeneXpert MRSA Assay (FDA approved for NASAL specimens only), is one component of a comprehensive MRSA colonization surveillance program. It is not intended to diagnose MRSA infection nor to guide or monitor treatment for MRSA infections.   C difficile quick scan w PCR reflex     Status: None   Collection Time: 07/10/15  2:08 AM  Result Value Ref Range Status   C Diff antigen NEGATIVE NEGATIVE Final   C Diff toxin NEGATIVE NEGATIVE Final   C Diff interpretation Negative for toxigenic C. difficile  Final  Gastrointestinal Panel by PCR , Stool     Status: None   Collection Time: 07/10/15  2:08 AM  Result Value Ref Range Status   Campylobacter species NOT DETECTED NOT DETECTED Final   Plesimonas shigelloides NOT DETECTED NOT DETECTED Final   Salmonella species NOT DETECTED NOT DETECTED Final   Yersinia enterocolitica NOT DETECTED NOT DETECTED Final   Vibrio species NOT DETECTED NOT DETECTED Final   Vibrio cholerae NOT DETECTED NOT DETECTED Final   Enteroaggregative E coli (EAEC) NOT DETECTED NOT DETECTED Final   Enteropathogenic E coli (EPEC) NOT DETECTED NOT DETECTED Final   Enterotoxigenic E coli (ETEC) NOT DETECTED  NOT DETECTED Final   Shiga like toxin producing E coli (STEC) NOT DETECTED NOT DETECTED Final   E. coli O157 NOT DETECTED NOT DETECTED Final   Shigella/Enteroinvasive E coli (EIEC) NOT DETECTED NOT DETECTED Final   Cryptosporidium NOT DETECTED NOT DETECTED Final   Cyclospora cayetanensis NOT DETECTED NOT DETECTED Final   Entamoeba histolytica NOT DETECTED NOT DETECTED Final   Giardia lamblia NOT DETECTED NOT DETECTED Final   Adenovirus F40/41 NOT DETECTED NOT DETECTED Final   Astrovirus NOT DETECTED NOT DETECTED Final   Norovirus GI/GII NOT DETECTED NOT DETECTED Final   Rotavirus A NOT DETECTED NOT DETECTED Final   Sapovirus (I, II, IV, and V) NOT DETECTED NOT DETECTED Final     Radiology Studies: Dg Chest 2 View  07/19/2015  CLINICAL DATA:  GI bleed, weakness, nonsmoker, long-term use immunosuppressant medication, possible infiltrates EXAM: CHEST  2 VIEW COMPARISON:  None. FINDINGS: Cardiomediastinal silhouette is unremarkable. Mild hyperinflation. No acute infiltrate or pleural effusion. No pulmonary edema. Mild thoracic spine osteopenia. Degenerative changes mid and lower thoracic spine. IMPRESSION: No active disease. Mild hyperinflation. Degenerative changes mid and lower thoracic spine. Electronically Signed   By: Natasha Mead M.D.   On: 07/19/2015 08:16    Scheduled Meds: . sodium chloride   Intravenous Once  . sodium chloride   Intravenous Once  . ALPRAZolam  0.25 mg Oral Daily  . cholecalciferol  5,000 Units Oral q morning - 10a  . dicyclomine  10 mg Oral TID AC  . feeding supplement (PRO-STAT SUGAR FREE 64)  30 mL Oral TID  . folic acid  1 mg Oral Daily  . insulin aspart  0-20 Units Subcutaneous Q4H  . levothyroxine  100 mcg Oral QAC breakfast  . methylPREDNISolone (SOLU-MEDROL) injection  60 mg Intravenous Daily  . metoCLOPramide (REGLAN) injection  10 mg Intravenous Once  . ondansetron  4 mg Intravenous BID  . pantoprazole  40 mg Oral Daily  . polyethylene glycol powder  1  Container Oral Once  .  simvastatin  20 mg Oral q1800  . sodium chloride flush  10-40 mL Intracatheter Q12H  . tuberculin  5 Units Intradermal Once   Continuous Infusions:     LOS: 10 days    Time spent in minutes: 35    Natanel Snavely, MD [facility] Pager: [email or web address] Password [email or web address] 07/19/2015, 11:39 AM

## 2015-07-19 NOTE — Care Management Important Message (Signed)
Important Message  Patient Details  Name: Jamie Wood MRN: [id number] Date of Birth: 1945-10-13   Medicare Important Message Given:  Yes    [name], [name] 07/19/2015, 10:32 AMImportant Message  Patient Details  Name: Jamie Wood MRN: [id number] Date of Birth: 1945-09-12   Medicare Important Message Given:  Yes    [name], [name] 07/19/2015, 10:32 AM

## 2015-07-19 NOTE — Progress Notes (Signed)
Progress Note    Assessment / Plan:   Assessment/ Plan: 1. Ulcerative Colitis: Patient with continued hematochezia today, no improvement overnight. Continue IV Solumedrol. Continue clear liquids. Quantiferon Gold test results indeterminate. CXR shows no active dz. ID recommends PPD, ordered this morning. Pending those results plan to initiate Remicade, this was discussed in detail with the patient at time of interview. 2. Acute blood loss anemia: Currently hgb at 8.8, decreased from 9.7 yesterday after 2 u prbcs, will continue monitoring q6h., recommend keeping hgb >8. 3. RUE DVT: IV heparin on hold    Thank you for your kind consultation, we will continue to follow.   LOS: 10 days   Unk Lightning  07/19/2015, 8:54 AM    [facility] Attending   I have taken an interval history, reviewed the chart and examined the patient. I agree with the Advanced Practitioner's note, impression and recommendations.     In reviewing the case she did not have changes of chronic colitis on the biopsies so ? If really IBD. Could be start of disease. Needs a PPD given indeterminate quantiferon. Will do colonoscopy tomorrow - repeat bxs and try for complete exam. The risks and benefits as well as alternatives of endoscopic procedure(s) have been discussed and reviewed. All questions answered. The patient agrees to proceed.  Iva Boop, MD, [facility] [phone] (pager) [phone] after 5 PM, weekends and holidays  07/19/2015 11:09 AM      Subjective  Jamie Wood is a 70 y/o female who is admitted to the hospital for complicated UC. This morning, the patient is found on the bedside comode having multiple watery bloody stools. She complains of persistent crampy abdominal pain and nausea which is controlled on BID Zofran. She has continue with a decreased appetite. She tells me she received 2 u PRBCs yesterday. She did have a CXR this morning. She is aware of plans to  try and initiate Remicade pending TB workup.   Objective   Vital signs in last 24 hours: Temp:  [97.7 F (36.5 C)-98.7 F (37.1 C)] 97.8 F (36.6 C) (06/05 0533) Pulse Rate:  [66-98] 66 (06/05 0533) Resp:  [18-19] 18 (06/05 0533) BP: (111-136)/(35-76) 134/76 mmHg (06/05 0533) SpO2:  [92 %-100 %] 98 % (06/05 0533) Last BM Date: 07/19/15 General: Caucasian female in NAD Heart:  Regular rate and rhythm; no murmurs Lungs: Respirations even and unlabored, lungs CTA bilaterally Abdomen:  Soft, Mild generalized ttp and nondistended. Normal bowel sounds. Obese Extremities:  Without pitting edema. Neurologic:  Alert and oriented,  grossly normal neurologically. Psych:  Cooperative. Normal mood and affect.  Intake/Output from previous day: 06/04 0701 - 06/05 0700 In: 1530 [P.O.:360; I.V.:500; Blood:670] Out: 3075 [Urine:3075]  Lab Results:  Recent Labs  07/17/15 0538  07/18/15 0619 07/18/15 2229 07/19/15 0615  WBC 6.6  --  8.7  --   --   HGB 8.0*  < > 6.8* 9.7* 8.8*  HCT 24.4*  < > 20.9* 28.6* 25.9*  PLT 184  --  207  --   --   < > = values in this interval not displayed. BMET  Recent Labs  07/17/15 0538 07/19/15 0615  NA 136 136  K 3.9 3.9  CL 107 107  CO2 25 24  GLUCOSE 127* 120*  BUN 22* 24*  CREATININE 1.15* 1.13*  CALCIUM 7.3* 7.4*    Studies/Results: Dg Chest 2 View  07/19/2015  CLINICAL DATA:  GI bleed, weakness, nonsmoker, long-term use  immunosuppressant medication, possible infiltrates EXAM: CHEST  2 VIEW COMPARISON:  None. FINDINGS: Cardiomediastinal silhouette is unremarkable. Mild hyperinflation. No acute infiltrate or pleural effusion. No pulmonary edema. Mild thoracic spine osteopenia. Degenerative changes mid and lower thoracic spine. IMPRESSION: No active disease. Mild hyperinflation. Degenerative changes mid and lower thoracic spine. Electronically Signed   By: Natasha Mead M.D.   On: 07/19/2015 08:16

## 2015-07-19 NOTE — Progress Notes (Signed)
Date:  July 19, 2015 Chart reviewed for concurrent status and case management needs. Will continue to follow the patient for changes and needs: Expected discharge date: [id number] Marcelle Smiling, BSN, [place], [place]   [phone]

## 2015-07-20 ENCOUNTER — Inpatient Hospital Stay (HOSPITAL_COMMUNITY): Payer: Medicare Other | Admitting: Anesthesiology

## 2015-07-20 ENCOUNTER — Encounter (HOSPITAL_COMMUNITY): Payer: Self-pay | Admitting: Anesthesiology

## 2015-07-20 ENCOUNTER — Encounter (HOSPITAL_COMMUNITY)
Admission: EM | Disposition: A | Discharge status: Skilled Nursing Facility | Payer: Self-pay | Source: Home / Self Care | Attending: Internal Medicine

## 2015-07-20 HISTORY — PX: COLONOSCOPY WITH PROPOFOL: SHX5780

## 2015-07-20 LAB — HEMOGLOBIN AND HEMATOCRIT, BLOOD
HCT: 20.8 % — ABNORMAL LOW (ref 36.0–46.0)
HEMATOCRIT: 25.3 % — AB (ref 36.0–46.0)
HEMOGLOBIN: 8.6 g/dL — AB (ref 12.0–15.0)
Hemoglobin: 7.2 g/dL — ABNORMAL LOW (ref 12.0–15.0)

## 2015-07-20 LAB — GLUCOSE, CAPILLARY
GLUCOSE-CAPILLARY: 116 mg/dL — AB (ref 65–99)
GLUCOSE-CAPILLARY: 137 mg/dL — AB (ref 65–99)
Glucose-Capillary: 104 mg/dL — ABNORMAL HIGH (ref 65–99)
Glucose-Capillary: 186 mg/dL — ABNORMAL HIGH (ref 65–99)
Glucose-Capillary: 192 mg/dL — ABNORMAL HIGH (ref 65–99)

## 2015-07-20 SURGERY — COLONOSCOPY WITH PROPOFOL
Anesthesia: Monitor Anesthesia Care

## 2015-07-20 MED ORDER — DIPHENOXYLATE-ATROPINE 2.5-0.025 MG PO TABS
1.0000 | ORAL_TABLET | Freq: Three times a day (TID) | ORAL | Status: DC
  Administered 2015-07-20 – 2015-07-28 (×33): 1 via ORAL
  Filled 2015-07-20 (×33): qty 1

## 2015-07-20 MED ORDER — PROPOFOL 500 MG/50ML IV EMUL
INTRAVENOUS | Status: DC | PRN
  Administered 2015-07-20: 100 ug/kg/min via INTRAVENOUS

## 2015-07-20 MED ORDER — INSULIN ASPART 100 UNIT/ML ~~LOC~~ SOLN
0.0000 [IU] | Freq: Three times a day (TID) | SUBCUTANEOUS | Status: DC
  Administered 2015-07-20: 4 [IU] via SUBCUTANEOUS
  Administered 2015-07-21: 7 [IU] via SUBCUTANEOUS
  Administered 2015-07-21 (×2): 3 [IU] via SUBCUTANEOUS
  Administered 2015-07-22: 4 [IU] via SUBCUTANEOUS
  Administered 2015-07-22 – 2015-07-23 (×3): 3 [IU] via SUBCUTANEOUS
  Administered 2015-07-23: 7 [IU] via SUBCUTANEOUS
  Administered 2015-07-23 – 2015-07-27 (×6): 3 [IU] via SUBCUTANEOUS
  Administered 2015-07-28: 4 [IU] via SUBCUTANEOUS
  Administered 2015-07-28: 3 [IU] via SUBCUTANEOUS

## 2015-07-20 MED ORDER — PROPOFOL 10 MG/ML IV BOLUS
INTRAVENOUS | Status: DC | PRN
  Administered 2015-07-20: 40 mg via INTRAVENOUS
  Administered 2015-07-20 (×2): 20 mg via INTRAVENOUS

## 2015-07-20 MED ORDER — PROPOFOL 10 MG/ML IV BOLUS
INTRAVENOUS | Status: AC
  Filled 2015-07-20: qty 40

## 2015-07-20 SURGICAL SUPPLY — 21 items

## 2015-07-20 NOTE — Progress Notes (Addendum)
PROGRESS NOTE    Jamie Wood  ZOX:[id number] DOB: 04-May-1945 DOA: 07/09/2015  PCP: Cala Bradford, MD   Brief Narrative:  Jamie Wood is an 70 y.o. female with abdominal pain and diarrhea for about 1 month with a recent endoscopy and 07/03/2015 with biopsies revealing moderate inflammation in rectum and sigmoid areas. Treated empirically with Lialda and Rowasa enema. She did not improve and was started on Prednisone 40 mg daily. She followed up with GI and mentioned dizziness and ongoing loose stools and was sent to the ED and admitted for IV hydration, IV antibiotics and placed on IV steroids by GI and Imodium.   Subjective: S/p colonoscopy- no new complaints.   Assessment & Plan:   Principal Problem:   Colitis, inflammatory- GI bleed- anemia due to acute blood loss - biopsies done on colonoscopy on 5/23 as mentioned showed inflammation - GI suspecting UC- cont steroids per GI- antibiotics stopped after 5 days- c diff and GI pathogen panel negative -follow diarrhea - TNA was being given but now stopped  - quantiferon gold indetermine- ppd placed yesterday - hepatitis serologies negative  - prednisone started with hopes to d/c home however discovered RUE DVT- she was started on Heparin and switched back to IV steroids  - GI planning to start Remicade  - following for futher bloody stools- Hb dropping to 6.9- transfused 2 U PRBC for total of 6 u PRBC-  - GI repeated Colonoscopy today with further biopsies and added Lomotil  Active Problems:  DVT right arm associated with PICC line - axillary DVT  - Heparin infusion stopped due to increase in bleeding - PICC removed - follow right arm daily for recurrence of edema   Hyponatremia/ orthostatic hypotension/ Dehydration/ CKD 3 - Cr 1.2   >> now 1.1 (baseline)  - started measuring urine and stool separately yesterday- had 2400 cc of urine- will stop IVF for today as she has anasarca- resume IVF if urine output drops   Severe  protein calorie malnutrition with anasarca - TNA stopped (no PICC) - follow PO intake- increased Prostat to TID  DVT prophylaxis: SCDs Code Status: full code Family Communication:  Disposition Plan: home when bloodly stools resolve Consultants:   GI Procedures:   Colonoscopy prior to this admission Antimicrobials:  Anti-infectives    Start     Dose/Rate Route Frequency Ordered Stop   07/10/15 0330  metroNIDAZOLE (FLAGYL) IVPB 500 mg  Status:  Discontinued     500 mg 100 mL/hr over 60 Minutes Intravenous Every 8 hours 07/10/15 0241 07/14/15 0914   07/10/15 0300  ciprofloxacin (CIPRO) IVPB 400 mg  Status:  Discontinued     400 mg 200 mL/hr over 60 Minutes Intravenous 2 times daily 07/10/15 0241 07/14/15 0914       Objective: Filed Vitals:   07/20/15 0850 07/20/15 0855 07/20/15 0900 07/20/15 1040  BP: 162/59   123/60  Pulse: 73 79 85 85  Temp:    98.4 F (36.9 C)  TempSrc:    Oral  Resp: 16 19 21 18   Height:      Weight:      SpO2: 98% 96% 93% 100%    Intake/Output Summary (Last 24 hours) at 07/20/15 1347 Last data filed at 07/20/15 0809  Gross per 24 hour  Intake    400 ml  Output   3350 ml  Net  -2950 ml   Filed Weights   07/19/15 2035 07/20/15 0600 07/20/15 0702  Weight: 112.7 kg (248 lb  7.3 oz) 113.3 kg (249 lb 12.5 oz) 104.327 kg (230 lb)    Examination: General exam: Appears comfortable  HEENT: PERRLA, oral mucosa moist, no sclera icterus or thrush Respiratory system: Clear to auscultation. Respiratory effort normal. Cardiovascular system: S1 & S2 heard, RRR.  No murmurs  Gastrointestinal system: Abdomen soft, tender in lower abdomen, nondistended. Normal bowel sound. No organomegaly Central nervous system: Alert and oriented. No focal neurological deficits. Extremities: No cyanosis, clubbing - significant peripheral edema Skin: No rashes or ulcers Psychiatry:  Mood & affect appropriate.     Data Reviewed: I have personally reviewed following labs  and imaging studies  CBC:  Recent Labs Lab 07/14/15 0458 07/15/15 0440 07/16/15 0025  07/17/15 4010  07/18/15 2725 07/18/15 2229 07/19/15 0615 07/19/15 1833 07/19/15 2114 07/20/15 1014  WBC 13.1* 9.4 10.3  --  6.6  --  8.7  --   --   --   --   --   HGB 10.2* 9.1* 8.7*  < > 8.0*  < > 6.8* 9.7* 8.8* 9.4* 9.3* 8.6*  HCT 30.4* 27.9* 26.0*  < > 24.4*  < > 20.9* 28.6* 25.9* 27.2* 26.7* 25.3*  MCV 91.3 92.7 89.7  --  92.1  --  93.3  --   --   --   --   --   PLT 272 192 220  --  184  --  207  --   --   --   --   --   < > = values in this interval not displayed. Basic Metabolic Panel:  Recent Labs Lab 07/15/15 1601 07/16/15 0025 07/17/15 0538 07/19/15 0615  NA 135 132* 136 136  K 4.9 4.6 3.9 3.9  CL 104 109 107 107  CO2 GLUCOSE 186* 155* 127* 120*  BUN 23* 23* 22* 24*  CREATININE 1.25* 1.16* 1.15* 1.13*  CALCIUM 7.2* 7.1* 7.3* 7.4*   GFR: Estimated Creatinine Clearance: 56 mL/min (by C-G formula based on Cr of 1.13). Liver Function Tests: No results for input(s): AST, ALT, ALKPHOS, BILITOT, PROT, ALBUMIN in the last 168 hours. No results for input(s): LIPASE, AMYLASE in the last 168 hours. No results for input(s): AMMONIA in the last 168 hours. Coagulation Profile:  Recent Labs Lab 07/15/15 1601  INR 1.22   Cardiac Enzymes: No results for input(s): CKTOTAL, CKMB, CKMBINDEX, TROPONINI in the last 168 hours. BNP (last 3 results) No results for input(s): PROBNP in the last 8760 hours. HbA1C: No results for input(s): HGBA1C in the last 72 hours. CBG:  Recent Labs Lab 07/19/15 1713 07/19/15 2032 07/20/15 0007 07/20/15 0417 07/20/15 1148  GLUCAP 146* 187* 137* 116* 104*   Lipid Profile: No results for input(s): CHOL, HDL, LDLCALC, TRIG, CHOLHDL, LDLDIRECT in the last 72 hours. Thyroid Function Tests: No results for input(s): TSH, T4TOTAL, FREET4, T3FREE, THYROIDAB in the last 72 hours. Anemia Panel: No results for input(s): VITAMINB12, FOLATE,  FERRITIN, TIBC, IRON, RETICCTPCT in the last 72 hours. Urine analysis:    Component Value Date/Time   COLORURINE AMBER* 07/10/2015 2016   APPEARANCEUR CLOUDY* 07/10/2015 2016   LABSPEC 1.018 07/10/2015 2016   PHURINE 6.0 07/10/2015 2016   GLUCOSEU NEGATIVE 07/10/2015 2016   HGBUR NEGATIVE 07/10/2015 2016   BILIRUBINUR NEGATIVE 07/10/2015 2016   KETONESUR NEGATIVE 07/10/2015 2016   PROTEINUR NEGATIVE 07/10/2015 2016   NITRITE NEGATIVE 07/10/2015 2016   LEUKOCYTESUR NEGATIVE 07/10/2015 2016   Sepsis Labs: (procalcitonin:4,lacticidven:4)    No results found for  this or any previous visit (from the past 240 hour(s)).   Radiology Studies: Dg Chest 2 View  07/19/2015  CLINICAL DATA:  GI bleed, weakness, nonsmoker, long-term use immunosuppressant medication, possible infiltrates EXAM: CHEST  2 VIEW COMPARISON:  None. FINDINGS: Cardiomediastinal silhouette is unremarkable. Mild hyperinflation. No acute infiltrate or pleural effusion. No pulmonary edema. Mild thoracic spine osteopenia. Degenerative changes mid and lower thoracic spine. IMPRESSION: No active disease. Mild hyperinflation. Degenerative changes mid and lower thoracic spine. Electronically Signed   By: Natasha Mead M.D.   On: 07/19/2015 08:16    Scheduled Meds: . ALPRAZolam  0.25 mg Oral Daily  . cholecalciferol  5,000 Units Oral q morning - 10a  . diphenoxylate-atropine  1 tablet Oral TID AC & HS  . feeding supplement (PRO-STAT SUGAR FREE 64)  30 mL Oral TID  . folic acid  1 mg Oral Daily  . insulin aspart  0-20 Units Subcutaneous Q4H  . levothyroxine  100 mcg Oral QAC breakfast  . methylPREDNISolone (SOLU-MEDROL) injection  60 mg Intravenous Daily  . ondansetron  4 mg Intravenous BID  . pantoprazole  40 mg Oral Daily  . simvastatin  20 mg Oral q1800  . sodium chloride flush  10-40 mL Intracatheter Q12H  . tuberculin  5 Units Intradermal Once   Continuous Infusions:     LOS: 11 days    Time spent in minutes:  35    Deasia Chiu, MD [facility] Pager: [email or web address] Password [email or web address] 07/20/2015, 1:47 PM

## 2015-07-20 NOTE — Anesthesia Postprocedure Evaluation (Signed)
Anesthesia Post Note  Patient: Jamie Wood  Procedure(s) Performed: Procedure(s) (LRB): COLONOSCOPY WITH PROPOFOL (N/A)  Patient location during evaluation: PACU Anesthesia Type: MAC Level of consciousness: awake Pain management: pain level controlled Vital Signs Assessment: post-procedure vital signs reviewed and stable Respiratory status: spontaneous breathing Cardiovascular status: stable Anesthetic complications: no    Last Vitals:  Filed Vitals:   07/20/15 0900 07/20/15 1040  BP:  123/60  Pulse: 85 85  Temp:  36.9 C  Resp: 21 18    Last Pain:  Filed Vitals:   07/20/15 1041  PainSc: 0-No pain                 EDWARDS,Erdem Naas

## 2015-07-20 NOTE — Progress Notes (Signed)
Patient refuses to wear CPAP HS. She states her last sleep study showed that she does not need it any longer. She also states that she is noncompliant at home. No distress noted. Nurse aware.

## 2015-07-20 NOTE — Transfer of Care (Signed)
Immediate Anesthesia Transfer of Care Note  Patient: Jamie Wood  Procedure(s) Performed: Procedure(s): COLONOSCOPY WITH PROPOFOL (N/A)  Patient Location: PACU  Anesthesia Type:MAC  Level of Consciousness:  sedated, patient cooperative and responds to stimulation  Airway & Oxygen Therapy:Patient Spontanous Breathing and Patient connected to face mask oxgen  Post-op Assessment:  Report given to PACU RN and Post -op Vital signs reviewed and stable  Post vital signs:  Reviewed and stable  Last Vitals:  Filed Vitals:   07/20/15 0703 07/20/15 0808  BP: 151/53   Pulse: 79 86  Temp: 37 C   Resp: 11 22    Complications: No apparent anesthesia complications

## 2015-07-20 NOTE — Op Note (Signed)
[facility] Patient Name: Jamie Wood Procedure Date: 07/20/2015 MRN: [id number] Attending MD: Iva Boop , MD Date of Birth: [birth year] CSN: [id number] Age: 70 Admit Type: Inpatient Procedure:                Colonoscopy Indications:              Obtain more precise diagnosis of inflammatory bowel                            disease Providers:                Iva Boop, MD, Tomma Rakers, RN, Kandice Robinsons, Technician Referring MD:              Medicines:                Monitored Anesthesia Care Complications:            No immediate complications. Estimated Blood Loss:     Estimated blood loss was minimal. Procedure:                Pre-Anesthesia Assessment:                           - Prior to the procedure, a History and Physical                            was performed, and patient medications and                            allergies were reviewed. The patient's tolerance of                            previous anesthesia was also reviewed. The risks                            and benefits of the procedure and the sedation                            options and risks were discussed with the patient.                            All questions were answered, and informed consent                            was obtained. Prior Anticoagulants: The patient has                            taken no previous anticoagulant or antiplatelet                            agents. ASA Grade Assessment: III - A patient with                            severe  systemic disease. After reviewing the risks                            and benefits, the patient was deemed in                            satisfactory condition to undergo the procedure.                           After obtaining informed consent, the colonoscope                            was passed under direct vision. Throughout the                            procedure, the patient's blood  pressure, pulse, and                            oxygen saturations were monitored continuously. The                            EC-[id number] ([id number]) scope was introduced through                            the anus and advanced to the the terminal ileum,                            with identification of the appendiceal orifice and                            IC valve. The colonoscopy was performed without                            difficulty. The patient tolerated the procedure                            well. No bowel preparation was given prior to the                            procedure. The quality of visualization was good.                            The terminal ileum, ileocecal valve, appendiceal                            orifice, and rectum were photographed. Scope In: Scope Out: Findings:      The perianal exam findings include skin tags.      The digital rectal exam findings include decreased sphincter tone.      Patchy severe inflammation throughout colon with confluent areas and       some skip areas - characterized by altered vascularity, erosions,       erythema, friability, granularity, pseudopolyps, serpentine ulcerations       and shallow ulcerations was found in the entire colon. Biopsies  were       taken with a cold forceps for histology. Verification of patient       identification for the specimen was done. Estimated blood loss was       minimal.      The terminal ileum appeared normal. Impression:               - Perianal skin tags found on perianal exam.                           - Decreased sphincter tone found on digital rectal                            exam.                           - Patchy severe inflammation was found in the                            entire examined colon. Biopsied.                           - The examined portion of the ileum was normal.                           - ? if this is Crohn's disease - bizarre polypoid                             colitis - do not think neoplastic but is possible i                            suppose. Moderate Sedation:      Please see anesthesia notes, moderate sedation not given Recommendation:           - Return patient to hospital ward for ongoing care.                           - Full liquid diet.                           - Lomotil 1 tablet PO QID.                           - Await pathology results.                           - Continue present medications.                           - Repeat colonoscopy is recommended. The                            colonoscopy date will be determined after pathology                            results from today's exam become available for  review. Procedure Code(s):        --- Professional ---                           [phone], Colonoscopy, flexible; with biopsy, single                            or multiple Diagnosis Code(s):        --- Professional ---                           K62.89, Other specified diseases of anus and rectum                           K52.9, Noninfective gastroenteritis and colitis,                            unspecified                           K64.4, Residual hemorrhoidal skin tags                           K52.3, Indeterminate colitis CPT copyright 2016 [employer]. All rights reserved. The codes documented in this report are preliminary and upon coder review may  be revised to meet current compliance requirements. Iva Boop, MD 07/20/2015 8:24:21 AM This report has been signed electronically. Number of Addenda: 0

## 2015-07-20 NOTE — Progress Notes (Signed)
PT Cancellation Note  Patient Details Name: Jamie Wood MRN: [id number] DOB: Jul 01, 1945   Cancelled Treatment:    Reason Eval/Treat Not Completed: Patient at procedure or test/unavailable/endo   Rada Hay 07/20/2015, 7:19 AM Blanchard Kelch PT [phone]

## 2015-07-20 NOTE — Anesthesia Preprocedure Evaluation (Signed)
Anesthesia Evaluation  Patient identified by MRN, date of birth, ID band Patient awake    Reviewed: Allergy & Precautions, NPO status , Patient's Chart, lab work & pertinent test results  Airway Mallampati: II  TM Distance: >3 FB Neck ROM: Full    Dental   Pulmonary sleep apnea ,    breath sounds clear to auscultation       Cardiovascular negative cardio ROS   Rhythm:Regular Rate:Normal     Neuro/Psych    GI/Hepatic Neg liver ROS, PUD, GERD  ,  Endo/Other  Hypothyroidism   Renal/GU Renal disease     Musculoskeletal   Abdominal   Peds  Hematology   Anesthesia Other Findings   Reproductive/Obstetrics                             Anesthesia Physical Anesthesia Plan  ASA: III  Anesthesia Plan: MAC   Post-op Pain Management:    Induction: Intravenous  Airway Management Planned: Simple Face Mask  Additional Equipment:   Intra-op Plan:   Post-operative Plan:   Informed Consent: I have reviewed the patients History and Physical, chart, labs and discussed the procedure including the risks, benefits and alternatives for the proposed anesthesia with the patient or authorized representative who has indicated his/her understanding and acceptance.   Dental advisory given  Plan Discussed with: CRNA and Anesthesiologist  Anesthesia Plan Comments:         Anesthesia Quick Evaluation

## 2015-07-21 LAB — COMPREHENSIVE METABOLIC PANEL
ALT: 25 U/L (ref 14–54)
ANION GAP: 6 (ref 5–15)
AST: 22 U/L (ref 15–41)
Albumin: 1.7 g/dL — ABNORMAL LOW (ref 3.5–5.0)
Alkaline Phosphatase: 55 U/L (ref 38–126)
BUN: 21 mg/dL — ABNORMAL HIGH (ref 6–20)
CHLORIDE: 107 mmol/L (ref 101–111)
CO2: 23 mmol/L (ref 22–32)
Calcium: 7.5 mg/dL — ABNORMAL LOW (ref 8.9–10.3)
Creatinine, Ser: 0.96 mg/dL (ref 0.44–1.00)
GFR calc Af Amer: >60 mL/min (ref >60)
GFR, EST NON AFRICAN AMERICAN: 59 mL/min — AB (ref >60)
Glucose, Bld: 126 mg/dL — ABNORMAL HIGH (ref 65–99)
POTASSIUM: 3.6 mmol/L (ref 3.5–5.1)
Sodium: 136 mmol/L (ref 135–145)
Total Bilirubin: 0.4 mg/dL (ref 0.3–1.2)
Total Protein: 4.3 g/dL — ABNORMAL LOW (ref 6.5–8.1)

## 2015-07-21 LAB — GLUCOSE, CAPILLARY
GLUCOSE-CAPILLARY: 131 mg/dL — AB (ref 65–99)
Glucose-Capillary: 136 mg/dL — ABNORMAL HIGH (ref 65–99)
Glucose-Capillary: 202 mg/dL — ABNORMAL HIGH (ref 65–99)
Glucose-Capillary: 125 mg/dL — ABNORMAL HIGH (ref 65–99)

## 2015-07-21 LAB — CBC
HCT: 23.4 % — ABNORMAL LOW (ref 36.0–46.0)
Hemoglobin: 7.8 g/dL — ABNORMAL LOW (ref 12.0–15.0)
MCH: 30.6 pg (ref 26.0–34.0)
MCHC: 33.3 g/dL (ref 30.0–36.0)
MCV: 91.8 fL (ref 78.0–100.0)
PLATELETS: 165 10*3/uL (ref 150–400)
RBC: 2.55 MIL/uL — ABNORMAL LOW (ref 3.87–5.11)
RDW: 15.1 % (ref 11.5–15.5)
WBC: 4.4 10*3/uL (ref 4.0–10.5)

## 2015-07-21 LAB — HEMOGLOBIN AND HEMATOCRIT, BLOOD
HCT: 24.3 % — ABNORMAL LOW (ref 36.0–46.0)
HEMOGLOBIN: 8.2 g/dL — AB (ref 12.0–15.0)

## 2015-07-21 MED ORDER — SODIUM CHLORIDE 0.9 % IV SOLN
10.0000 mg/kg | Freq: Once | INTRAVENOUS | Status: AC
  Administered 2015-07-21: 1000 mg via INTRAVENOUS
  Filled 2015-07-21: qty 100

## 2015-07-21 MED ORDER — DIPHENHYDRAMINE HCL 25 MG PO CAPS: 50.0000 mg | ORAL_CAPSULE | Freq: Once | ORAL | Status: DC

## 2015-07-21 MED ORDER — FUROSEMIDE 10 MG/ML IJ SOLN
20.0000 mg | Freq: Once | INTRAMUSCULAR | Status: AC
  Administered 2015-07-21: 20 mg via INTRAVENOUS
  Filled 2015-07-21: qty 2

## 2015-07-21 NOTE — Progress Notes (Signed)
PROGRESS NOTE                                                                                                                                                                                                             Patient Demographics:    Jamie Wood, is a 70 y.o. female, DOB - Aug 19, 1945, ZOX:[id number]  Admit date - 07/09/2015   Admitting Physician Courage Mariea Clonts, MD  Outpatient Primary MD for the patient is Cala Bradford, MD  LOS - 12  Chief Complaint  Patient presents with  . Abdominal Pain  . Abnormal Lab       Brief Narrative  Jamie Wood is an 70 y.o. female with abdominal pain and diarrhea for about 1 month with a recent endoscopy and 07/03/2015 with biopsies revealing moderate inflammation in rectum and sigmoid areas.Treated empirically with anti-inflammatories, steroids without any response. Hospital course has been prolonged and complicated by persistent diarrhea with hematochezia and acute blood loss anemia requiring numerous PRBC transfusion. Repeat colonoscopy on 6/6 showed severe patchy inflammation in the entire colon, preliminary biopsy results consistent with Crohn's disease. GI now recommending starting Remicade.    Subjective:    Jamie Wood continues to have multiple bowel movements with blood.    Assessment  & Plan :  Inflammation bowel disease with exacerbation: Ongoing diarrhea with hematochezia for almost 1 month, resistant to anti-inflammatories and IV steroids. Repeat colonoscopy on 6/6 shows significant inflammation in the entire colon, biopsy results consistent with inflammatory bowel disease (ulcerative colitis). Continues to be on IV Solu-Medrol, GI now recommending initiating Remicade   Acute blood loss anemia: Secondary to persistent hematochezia due to above. Has received a total of 6 units of PRBC so far. Continue to follow hemoglobin and transfuse as needed.  DVT right arm  associated with PICC line: Initially anticoagulated with IV heparin, but discontinued due to worsening hematochezia requiring PRBC transfusion. PICC line has been removed, plans are to monitor and if he initiate anticoagulation once hematochezia is better  AKI: Resolved, likely mild prerenal azotemia in a setting of diarrhea. Follow electrolytes periodically.  Dyslipidemia: Continue statin  Hypothyroidism: Continue levothyroxine  Lower extremity edema: Suspect secondary to hypoalbuminemia, UA without proteinuria. We will check echo to complete workup. Start low-dose Lasix.  Severe protein calorie malnutrition with anasarca: Continue supplements, previously on TNA- stopped (no PICC)  Code Status :  Full code  Family Communication  :  Brother in room  Disposition Plan  :  Home when bloody stools improve-requires several more days of hospitalization.  Consults  :  GI  Procedures  :  Colonoscopy 6/6  DVT Prophylaxis  :  SCDs   Lab Results  Component Value Date   PLT 165 07/21/2015    Inpatient Medications  Scheduled Meds: . ALPRAZolam  0.25 mg Oral Daily  . cholecalciferol  5,000 Units Oral q morning - 10a  . diphenoxylate-atropine  1 tablet Oral TID AC & HS  . feeding supplement (PRO-STAT SUGAR FREE 64)  30 mL Oral TID  . folic acid  1 mg Oral Daily  . inFLIXimab (REMICADE) infusion  10 mg/kg Intravenous Once  . insulin aspart  0-20 Units Subcutaneous TID WC  . levothyroxine  100 mcg Oral QAC breakfast  . methylPREDNISolone (SOLU-MEDROL) injection  60 mg Intravenous Daily  . ondansetron  4 mg Intravenous BID  . pantoprazole  40 mg Oral Daily  . simvastatin  20 mg Oral q1800  . sodium chloride flush  10-40 mL Intracatheter Q12H   Continuous Infusions:  PRN Meds:.acetaminophen **OR** acetaminophen, albuterol, menthol-cetylpyridinium, morphine injection, ondansetron **OR** ondansetron (ZOFRAN) IV, oxyCODONE, sodium chloride flush, traZODone  Antibiotics  :     Anti-infectives    Start     Dose/Rate Route Frequency Ordered Stop   07/10/15 0330  metroNIDAZOLE (FLAGYL) IVPB 500 mg  Status:  Discontinued     500 mg 100 mL/hr over 60 Minutes Intravenous Every 8 hours 07/10/15 0241 07/14/15 0914   07/10/15 0300  ciprofloxacin (CIPRO) IVPB 400 mg  Status:  Discontinued     400 mg 200 mL/hr over 60 Minutes Intravenous 2 times daily 07/10/15 0241 07/14/15 0914         Objective:   Filed Vitals:   07/20/15 1513 07/20/15 2230 07/21/15 0439 07/21/15 0910  BP: 136/55 138/63 153/61 137/57  Pulse: 77 70 87 89  Temp: 98.3 F (36.8 C) 98.3 F (36.8 C) 98 F (36.7 C) 98.4 F (36.9 C)  TempSrc: Oral Oral Oral Oral  Resp: 20 20 20 22   Height:      Weight:      SpO2: 99% 99% 100% 100%    Wt Readings from Last 3 Encounters:  07/20/15 104.327 kg (230 lb)  07/06/15 104.327 kg (230 lb)  06/29/15 104.327 kg (230 lb)     Intake/Output Summary (Last 24 hours) at 07/21/15 1255 Last data filed at 07/21/15 1032  Gross per 24 hour  Intake    430 ml  Output   2825 ml  Net  -2395 ml     Physical Exam  Awake Alert, Oriented X 3, No new F.N deficits, Normal affect [place].AT,PERRAL Supple Neck,No JVD, No cervical lymphadenopathy appriciated.  Symmetrical Chest wall movement, Good air movement bilaterally, CTAB RRR,No Gallops,Rubs or new Murmurs, No Parasternal Heave +ve B.Sounds, Abd Soft, No tenderness, No organomegaly appriciated, No rebound - guarding or rigidity.  peripheral edema No Cyanosis, Clubbing ,No new Rash or bruise     Data Review:    CBC  Recent Labs Lab 07/15/15 0440 07/16/15 0025  07/17/15 0538  07/18/15 2956  07/19/15 1833 07/19/15 2114 07/20/15 1014 07/20/15 2131 07/21/15 0836  WBC 9.4 10.3  --  6.6  --  8.7  --   --   --   --   --  4.4  HGB 9.1* 8.7*  < > 8.0*  < >  6.8*  < > 9.4* 9.3* 8.6* 7.2* 7.8*  8.2*  HCT 27.9* 26.0*  < > 24.4*  < > 20.9*  < > 27.2* 26.7* 25.3* 20.8* 23.4*  24.3*  PLT 192 220  --  184  --   207  --   --   --   --   --  165  MCV 92.7 89.7  --  92.1  --  93.3  --   --   --   --   --  91.8  MCH 30.2 30.0  --  30.2  --  30.4  --   --   --   --   --  30.6  MCHC 32.6 33.5  --  32.8  --  32.5  --   --   --   --   --  33.3  RDW 15.7* 15.2  --  15.2  --  15.3  --   --   --   --   --  15.1  < > = values in this interval not displayed.  Chemistries   Recent Labs Lab 07/15/15 1601 07/16/15 0025 07/17/15 0538 07/19/15 0615 07/21/15 0836  NA 135 132* 136 136 136  K 4.9 4.6 3.9 3.9 3.6  CL 104 109 107 107 107  CO2 26 24 25 24 23   GLUCOSE 186* 155* 127* 120* 126*  BUN 23* 23* 22* 24* 21*  CREATININE 1.25* 1.16* 1.15* 1.13* 0.96  CALCIUM 7.2* 7.1* 7.3* 7.4* 7.5*  AST  --   --   --   --  22  ALT  --   --   --   --  25  ALKPHOS  --   --   --   --  55  BILITOT  --   --   --   --  0.4   ------------------------------------------------------------------------------------------------------------------ No results for input(s): CHOL, HDL, LDLCALC, TRIG, CHOLHDL, LDLDIRECT in the last 72 hours.  No results found for: HGBA1C ------------------------------------------------------------------------------------------------------------------ No results for input(s): TSH, T4TOTAL, T3FREE, THYROIDAB in the last 72 hours.  Invalid input(s): FREET3 ------------------------------------------------------------------------------------------------------------------ No results for input(s): VITAMINB12, FOLATE, FERRITIN, TIBC, IRON, RETICCTPCT in the last 72 hours.  Coagulation profile  Recent Labs Lab 07/15/15 1601  INR 1.22    No results for input(s): DDIMER in the last 72 hours.  Cardiac Enzymes No results for input(s): CKMB, TROPONINI, MYOGLOBIN in the last 168 hours.  Invalid input(s): CK ------------------------------------------------------------------------------------------------------------------ No results found for: BNP  Micro Results No results found for this or any  previous visit (from the past 240 hour(s)).  Radiology Reports Ct Abdomen Pelvis Wo Contrast  07/10/2015  CLINICAL DATA:  Acute onset of GI bleeding.  Initial encounter. EXAM: CT ABDOMEN AND PELVIS WITHOUT CONTRAST TECHNIQUE: Multidetector CT imaging of the abdomen and pelvis was performed following the standard protocol without IV contrast. COMPARISON:  CT of the abdomen and pelvis from 06/25/2015 FINDINGS: Trace right-sided pleural fluid is noted, with associated atelectasis. The liver and spleen are unremarkable in appearance. The gallbladder is within normal limits. The pancreas and adrenal glands are unremarkable. Scattered bilateral parapelvic renal cysts are seen. Minimal nonspecific perinephric stranding is noted bilaterally. There is no evidence of hydronephrosis. No renal or ureteral stones are identified. The small bowel is unremarkable in appearance. The stomach is within normal limits. No acute vascular abnormalities are seen. The patient is status post appendectomy. Vague soft tissue inflammation is noted along the ascending, descending and proximal sigmoid colon, concerning for acute  infectious or inflammatory colitis. Underlying diverticulosis is noted along the transverse, descending and sigmoid colon. Trace fluid is seen tracking along the paracolic gutters. The bladder is mildly distended and grossly unremarkable. The patient is status post hysterectomy. The ovaries are grossly symmetric. No suspicious adnexal masses are seen. No inguinal lymphadenopathy is seen. No acute osseous abnormalities are identified. IMPRESSION: 1. Vague soft tissue inflammation along the ascending, descending and proximal sigmoid colon, concerning for acute infectious or inflammatory colitis. Trace fluid tracking along the paracolic gutters. 2. Underlying diverticulosis along the transverse, descending and sigmoid colon. 3. Scattered bilateral parapelvic renal cysts again noted. 4. Trace right-sided pleural fluid,  with associated atelectasis. Electronically Signed   By: Roanna Raider M.D.   On: 07/10/2015 00:00   Dg Chest 2 View  07/19/2015  CLINICAL DATA:  GI bleed, weakness, nonsmoker, long-term use immunosuppressant medication, possible infiltrates EXAM: CHEST  2 VIEW COMPARISON:  None. FINDINGS: Cardiomediastinal silhouette is unremarkable. Mild hyperinflation. No acute infiltrate or pleural effusion. No pulmonary edema. Mild thoracic spine osteopenia. Degenerative changes mid and lower thoracic spine. IMPRESSION: No active disease. Mild hyperinflation. Degenerative changes mid and lower thoracic spine. Electronically Signed   By: Natasha Mead M.D.   On: 07/19/2015 08:16   Ct Abdomen Pelvis W Contrast  06/25/2015  CLINICAL DATA:  Bright red diarrhea this morning. Lower abdominal pain. EXAM: CT ABDOMEN AND PELVIS WITH CONTRAST TECHNIQUE: Multidetector CT imaging of the abdomen and pelvis was performed using the standard protocol following bolus administration of intravenous contrast. CONTRAST:  ISOVUE-300 IOPAMIDOL (ISOVUE-300) INJECTION 61% COMPARISON:  10/18/2010 FINDINGS: Lower chest: Stable 5 mm nodule in the right middle lobe on sequence 4, image 10 appears to have a few calcifications and probably represents a benign granuloma. Stable punctate nodular density near the right minor fissure on sequence 4, image 4. Small amount atelectasis or scarring at the right lung base. Hepatobiliary: Normal appearance of the liver, gallbladder and portal venous system. Pancreas: Fatty changes throughout the pancreas without evidence for inflammation or duct dilatation. Spleen: Normal appearance of spleen without enlargement Adrenals/Urinary Tract: Normal bilateral adrenal glands. There are bilateral peripelvic cysts without hydronephrosis. Normal urinary bladder. There is probably a punctate nonobstructive right kidney stone in lower pole. Stomach/Bowel: There is extensive colon wall thickening, predominantly involving  the transverse colon and hepatic flexure. There is small amount of pericolonic edema near the hepatic flexure. There is extensive diverticulosis involving the descending colon and the sigmoid colon. Difficult to exclude mild inflammation in the descending colon. There is oral contrast in the small bowel and right colon. No evidence for a small bowel obstruction. Normal appearance of the stomach and duodenum. Evidence for vascular engorgement in the omentum. Vascular/Lymphatic: Normal caliber of the abdominal aorta without significant atherosclerotic disease. Few scattered periaortic lymph nodes have not significantly changed. There are prominent lymph nodes along the lymphatic drainage of the sigmoid colon on sequence 2, images 66 and 63. There are enlarged lymph nodes near the left iliac chain which could also be along the lymphatic drainage of the left colon. These lymph nodes are best seen on sequence 2, image 59 and 60. Largest node measures 0.9 cm in the short axis. Reproductive: Uterus has been removed. Evidence for bilateral ovarian tissue without gross abnormality. Other: Trace amount of free fluid in the pelvis.  No free air. Musculoskeletal: Stable sclerotic density in the right ilium is suggestive for a bone island. No suspicious bone findings. IMPRESSION: Diffuse colonic wall thickening which is  most prominent in the transverse colon and hepatic flexure. Findings are suggestive for colitis. This could be an infectious or inflammatory process. There is a small amount of fluid in the pelvis. Extensive diverticulosis involving the sigmoid colon and left colon but this does not appear to be the source for the colonic inflammation. Prominent lymph nodes along the lymphatic distribution of the sigmoid colon could be reactive. These nodes have enlarged since 2012 and consider a 3-6 month follow-up for surveillance. Bilateral renal cysts without hydronephrosis. Electronically Signed   By: Richarda Overlie M.D.   On:  06/25/2015 10:41    Time Spent in minutes  30   Debbra Riding PA-S on 07/21/2015 at 12:55 PM

## 2015-07-21 NOTE — Progress Notes (Signed)
Pt continues to refuse CPAP QHS, RT to monitor and assess as needed.  

## 2015-07-21 NOTE — Progress Notes (Signed)
Quick Note:  Results reviewed at hospital I suspect crohn's phenotype Starting remicade 10 mg/kg Will need to get set up for outpatient dosing 2 and 6 weeks from now and then every 8 weeks - we should start that process now I think ______

## 2015-07-21 NOTE — Progress Notes (Signed)
Patient's PPD was assessed this morning at 0915 by this nurse and was confirmed by Dr Leone Payor at bedside. Skin test report sheet in patient's chart

## 2015-07-21 NOTE — Progress Notes (Signed)
     [facility] Progress Note  Subjective:  Still having several bowel movements daily with blood.  Thinks lomotil is helping some.  Hgb up a little today.  Objective:  Vital signs in last 24 hours: Temp:  [98 F (36.7 C)-98.4 F (36.9 C)] 98.4 F (36.9 C) (06/07 0910) Pulse Rate:  [70-89] 89 (06/07 0910) Resp:  [18-22] 22 (06/07 0910) BP: (123-153)/(55-63) 137/57 mmHg (06/07 0910) SpO2:  [99 %-100 %] 100 % (06/07 0910) Last BM Date: 07/21/15 General:  Alert, Well-developed, in NAD Heart:  Regular rate and rhythm; no murmurs Pulm:  CTAB.  No W/R/R. Abdomen:  Soft, non-distended.  BS present.  Mild diffuse TTP. Extremities:  Without edema. Neurologic:  Alert and oriented x 4;  grossly normal neurologically. Psych:  Alert and cooperative. Normal mood and affect.  Intake/Output from previous day: 06/06 0701 - 06/07 0700 In: 580 [P.O.:180; I.V.:400] Out: 2475 [Urine:1500; Stool:975]  Lab Results:  Recent Labs  07/20/15 1014 07/20/15 2131 07/21/15 0836  WBC  --   --  4.4  HGB 8.6* 7.2* 7.8*  8.2*  HCT 25.3* 20.8* 23.4*  24.3*  PLT  --   --  165   BMET  Recent Labs  07/19/15 0615 07/21/15 0836  NA 136 136  K 3.9 3.6  CL 107 107  CO2 24 23  GLUCOSE 120* 126*  BUN 24* 21*  CREATININE 1.13* 0.96  CALCIUM 7.4* 7.5*   Assessment / Plan: 1. Colitis, ? IBD: Patient with continued hematochezia/diarrhea. Continue IV Solumedrol.  Quantiferon Gold test results indeterminate. CXR shows no active dz. ID recommends PPD, which was given 6/5. Pending those results and results of pathology from repeat colonoscopy with biopsies on 6/6, plan to initiate Remicade. 2. Acute blood loss anemia: Currently hgb 8.2 grams, which is up slightly from yesterday. 3. RUE DVT: IV heparin on hold   LOS: 12 days   ZEHR, JESSICA D.  07/21/2015, 10:24 AM  Pager number [phone]     [facility] Attending   I have taken an interval history, reviewed the chart and examined the  patient. I agree with the Advanced Practitioner's note, impression and recommendations.    Bxs show IBD -severe chronic active I suspect Crohn's Tx same - time for remicade - i have reviewed risks benefits and indications and have provided info (written). 10 mg/kg given severity Her PPD is negative by my reading.  CRP daily  Increase Lomotil to 2 at a time if eeded  Iva Boop, MD, [facility] [phone] (pager) [phone] after 5 PM, weekends and holidays  07/21/2015 11:42 AM

## 2015-07-21 NOTE — Progress Notes (Signed)
Physical Therapy Treatment Patient Details Name: Jamie Wood MRN: [id number] DOB: [birth year] Today's Date: 07/21/2015    History of Present Illness Jamie Wood is an 70 y.o. female past medical history no GI discomfort with a recent endoscopy and 07/03/2015 that was suggestive  of possible inflammatory colitis however biopsies are not revealed inflammation, patient was treated empirically steroids, and previous GI pathogen was unremarkable the comes into the ED complaining of abdominal pain and bloody diarrhea for a month which has progressively gotten worse now she feels dizzy and lightheaded    PT Comments    Pt continues to participate well with therapy. Recommend daily ambulation in hallway with nursing supervision.   Follow Up Recommendations  No PT follow up     Equipment Recommendations  None recommended by PT    Recommendations for Other Services       Precautions / Restrictions Precautions Precautions: Fall Restrictions Weight Bearing Restrictions: No    Mobility  Bed Mobility Overal bed mobility: Modified Independent             General bed mobility comments: HOB elevated  Transfers Overall transfer level: Needs assistance   Transfers: Sit to/from Stand Sit to Stand: Supervision         General transfer comment: for safety  Ambulation/Gait Ambulation/Gait assistance: Min guard;Supervision Ambulation Distance (Feet): 150 Feet   Gait Pattern/deviations: Step-through pattern;Decreased stride length     General Gait Details: slightly unsteady at times. intermittent close guarding. Pt fatigues fairly easily.   Stairs            Wheelchair Mobility    Modified Rankin (Stroke Patients Only)       Balance                                    Cognition Arousal/Alertness: Awake/alert Behavior During Therapy: WFL for tasks assessed/performed Overall Cognitive Status: Within Functional Limits for tasks assessed                       Exercises      General Comments        Pertinent Vitals/Pain Pain Assessment: No/denies pain    Home Living                      Prior Function            PT Goals (current goals can now be found in the care plan section) Progress towards PT goals: Progressing toward goals    Frequency  Min 3X/week    PT Plan Current plan remains appropriate    Co-evaluation             End of Session Equipment Utilized During Treatment: Gait belt Activity Tolerance: Patient tolerated treatment well Patient left:  (in bathroom-student nurse and instructor in room to give meds)     Time: 4098-1191 PT Time Calculation (min) (ACUTE ONLY): 12 min  Charges:  $Gait Training: 8-22 mins                    G Codes:      Rebeca Alert, MPT Pager: [phone]

## 2015-07-22 ENCOUNTER — Inpatient Hospital Stay (HOSPITAL_COMMUNITY): Payer: Medicare Other

## 2015-07-22 ENCOUNTER — Encounter (HOSPITAL_COMMUNITY): Payer: Self-pay | Admitting: Internal Medicine

## 2015-07-22 DIAGNOSIS — R600 Localized edema: Secondary | ICD-10-CM

## 2015-07-22 LAB — GLUCOSE, CAPILLARY
GLUCOSE-CAPILLARY: 195 mg/dL — AB (ref 65–99)
GLUCOSE-CAPILLARY: 233 mg/dL — AB (ref 65–99)
Glucose-Capillary: 128 mg/dL — ABNORMAL HIGH (ref 65–99)
Glucose-Capillary: 148 mg/dL — ABNORMAL HIGH (ref 65–99)

## 2015-07-22 LAB — ECHOCARDIOGRAM COMPLETE
EERAT: 12.54
EWDT: 232 ms
FS: 37 % (ref 28–44)
Height: 64 in
IVS/LV PW RATIO, ED: 0.73
LA ID, A-P, ES: 46 mm
LA diam end sys: 46 mm
LA diam index: 2.16 cm/m2
LA vol A4C: 65.3 ml
LA vol index: 27.9 mL/m2
LAVOL: 59.5 mL
LV E/e' medial: 12.54
LV E/e'average: 12.54
LV e' LATERAL: 9.25 cm/s
MV Dec: 232
MVPG: 5 mmHg
MVPKAVEL: 83.9 m/s
MVPKEVEL: 116 m/s
PW: 13.4 mm — AB (ref 0.6–1.1)
RV TAPSE: 21.4 mm
TDI e' lateral: 9.25
TDI e' medial: 7.83
Weight: 3922.42 oz

## 2015-07-22 LAB — CBC
HEMATOCRIT: 18.9 % — AB (ref 36.0–46.0)
Hemoglobin: 6.3 g/dL — CL (ref 12.0–15.0)
MCH: 30 pg (ref 26.0–34.0)
MCHC: 33.3 g/dL (ref 30.0–36.0)
MCV: 90 fL (ref 78.0–100.0)
Platelets: 107 10*3/uL — ABNORMAL LOW (ref 150–400)
RBC: 2.1 MIL/uL — ABNORMAL LOW (ref 3.87–5.11)
RDW: 14.6 % (ref 11.5–15.5)
WBC: 4.7 10*3/uL (ref 4.0–10.5)

## 2015-07-22 LAB — BASIC METABOLIC PANEL
Anion gap: 5 (ref 5–15)
BUN: 23 mg/dL — ABNORMAL HIGH (ref 6–20)
CO2: 26 mmol/L (ref 22–32)
Calcium: 7.4 mg/dL — ABNORMAL LOW (ref 8.9–10.3)
Chloride: 106 mmol/L (ref 101–111)
Creatinine, Ser: 1.05 mg/dL — ABNORMAL HIGH (ref 0.44–1.00)
GFR, EST NON AFRICAN AMERICAN: 53 mL/min — AB (ref >60)
Glucose, Bld: 131 mg/dL — ABNORMAL HIGH (ref 65–99)
POTASSIUM: 3.9 mmol/L (ref 3.5–5.1)
Sodium: 137 mmol/L (ref 135–145)

## 2015-07-22 LAB — C-REACTIVE PROTEIN: CRP: 8.1 mg/dL — ABNORMAL HIGH (ref <1.0)

## 2015-07-22 LAB — PREPARE RBC (CROSSMATCH)

## 2015-07-22 MED ORDER — SODIUM CHLORIDE 0.9 % IV SOLN: Freq: Once | INTRAVENOUS | Status: DC

## 2015-07-22 MED ORDER — FUROSEMIDE 10 MG/ML IJ SOLN
20.0000 mg | Freq: Once | INTRAMUSCULAR | Status: AC
  Administered 2015-07-22: 20 mg via INTRAVENOUS
  Filled 2015-07-22: qty 2

## 2015-07-22 NOTE — Progress Notes (Signed)
PROGRESS NOTE                                                                                                                                                                                                             Patient Demographics:    Jamie Wood, is a 70 y.o. female, DOB - Apr 20, 1945, RUE:[id number]  Admit date - 07/09/2015   Admitting Physician Courage Mariea Clonts, MD  Outpatient Primary MD for the patient is Cala Bradford, MD  LOS - 13  Chief Complaint  Patient presents with  . Abdominal Pain  . Abnormal Lab       Brief Narrative  Jamie Wood is an 70 y.o. female with abdominal pain and diarrhea for about 1 month with a recent endoscopy and 07/03/2015 with biopsies revealing moderate inflammation in rectum and sigmoid areas.Treated empirically with anti-inflammatories, steroids without any response. Hospital course has been prolonged and complicated by persistent diarrhea with hematochezia and acute blood loss anemia requiring numerous PRBC transfusion. Repeat colonoscopy on 6/6 showed severe patchy inflammation in the entire colon, preliminary biopsy results consistent with Crohn's disease. Began Remicade on 6/7 and continues to have hematocheiza requiring PRBC today.    Subjective:    Jamie Wood continues to have multiple bowel movements with blood.   Assessment  & Plan :   Crohn's disease with exacerbation: Ongoing diarrhea with hematochezia for almost 1 month, resistant to anti-inflammatories and IV steroids. Repeat colonoscopy on 6/6 shows significant inflammation in the entire colon, biopsy results consistent with Crohn's disease (ulcerative colitis). Continues to be on IV Solu-Medrol, GI began Remicade on 6/7. Will continue to monitor symptoms for improvement.  Acute blood loss anemia: Secondary to persistent hematochezia due to above. Given 2 units PRBC today for Hb of 6.3, total of 8 units PRBC so far.  Continue to follow hemoglobin and transfuse as needed.  DVT right arm associated with PICC line: Initially anticoagulated with IV heparin, but discontinued due to worsening hematochezia requiring PRBC transfusion. PICC line has been removed, plans are to monitor and if he initiate anticoagulation once hematochezia is better-however will go ahead and obtain a repeat upper extremity Doppler tomorrow morning.  AKI: Resolved, likely mild prerenal azotemia in a setting of diarrhea. Follow electrolytes periodically.  Dyslipidemia: Continue statin  Hypothyroidism: Continue levothyroxine  Lower extremity edema: Suspect secondary to hypoalbuminemia,  UA without proteinuria. We will check echo to complete workup. Started low-dose Lasix 6/7. Continues to have edema today, will continue Lasix.   Severe protein calorie malnutrition with anasarca: Continue supplements, previously on TNA- stopped (no PICC)  Code Status :  Full  Family Communication  :  No family in room-patient is awake and alert and understanding of the above noted plan.  Disposition Plan  :  Home when bloody stools improve-requires several more days of hospitalization  Consults  :  GI  Procedures  :  Colonoscopy 6/6  DVT Prophylaxis  :  SCDs  Lab Results  Component Value Date   PLT 107* 07/22/2015    Inpatient Medications  Scheduled Meds: . sodium chloride   Intravenous Once  . ALPRAZolam  0.25 mg Oral Daily  . cholecalciferol  5,000 Units Oral q morning - 10a  . diphenoxylate-atropine  1 tablet Oral TID AC & HS  . feeding supplement (PRO-STAT SUGAR FREE 64)  30 mL Oral TID  . folic acid  1 mg Oral Daily  . furosemide  20 mg Intravenous Once  . insulin aspart  0-20 Units Subcutaneous TID WC  . levothyroxine  100 mcg Oral QAC breakfast  . methylPREDNISolone (SOLU-MEDROL) injection  60 mg Intravenous Daily  . ondansetron  4 mg Intravenous BID  . pantoprazole  40 mg Oral Daily  . simvastatin  20 mg Oral q1800  . sodium  chloride flush  10-40 mL Intracatheter Q12H   Continuous Infusions:  PRN Meds:.acetaminophen **OR** acetaminophen, albuterol, menthol-cetylpyridinium, morphine injection, ondansetron **OR** ondansetron (ZOFRAN) IV, oxyCODONE, sodium chloride flush, traZODone  Antibiotics  :    Anti-infectives    Start     Dose/Rate Route Frequency Ordered Stop   07/10/15 0330  metroNIDAZOLE (FLAGYL) IVPB 500 mg  Status:  Discontinued     500 mg 100 mL/hr over 60 Minutes Intravenous Every 8 hours 07/10/15 0241 07/14/15 0914   07/10/15 0300  ciprofloxacin (CIPRO) IVPB 400 mg  Status:  Discontinued     400 mg 200 mL/hr over 60 Minutes Intravenous 2 times daily 07/10/15 0241 07/14/15 0914         Objective:   Filed Vitals:   07/21/15 2040 07/22/15 0514 07/22/15 0823 07/22/15 0843  BP: 134/68 127/54 134/75 137/75  Pulse: 77 69 72 83  Temp: 98.6 F (37 C) 98.3 F (36.8 C) 98.4 F (36.9 C) 98.4 F (36.9 C)  TempSrc: Oral Oral Oral Oral  Resp: 20 18 18 18   Height:      Weight:  111.2 kg (245 lb 2.4 oz)    SpO2: 97% 99% 97% 99%    Wt Readings from Last 3 Encounters:  07/22/15 111.2 kg (245 lb 2.4 oz)  07/06/15 104.327 kg (230 lb)  06/29/15 104.327 kg (230 lb)     Intake/Output Summary (Last 24 hours) at 07/22/15 1011 Last data filed at 07/22/15 0919  Gross per 24 hour  Intake   1315 ml  Output   4125 ml  Net  -2810 ml     Physical Exam  Awake Alert, Oriented X 3, No new F.N deficits, Normal affect [place].AT,PERRAL Supple Neck,No JVD, No cervical lymphadenopathy appriciated.  Symmetrical Chest wall movement, Good air movement bilaterally, CTAB RRR,No Gallops,Rubs or new Murmurs, No Parasternal Heave +ve B.Sounds, Abd Soft, No tenderness, No organomegaly appriciated, No rebound - guarding or rigidity. peripheral edema No Cyanosis, Clubbing, No new Rash or bruise    Data Review:    CBC  Recent Labs  Lab 07/16/15 0025  07/17/15 0538  07/18/15 6237  07/19/15 2114 07/20/15 1014  07/20/15 2131 07/21/15 0836 07/22/15 0502  WBC 10.3  --  6.6  --  8.7  --   --   --   --  4.4 4.7  HGB 8.7*  < > 8.0*  < > 6.8*  < > 9.3* 8.6* 7.2* 7.8*  8.2* 6.3*  HCT 26.0*  < > 24.4*  < > 20.9*  < > 26.7* 25.3* 20.8* 23.4*  24.3* 18.9*  PLT 220  --  184  --  207  --   --   --   --  165 107*  MCV 89.7  --  92.1  --  93.3  --   --   --   --  91.8 90.0  MCH 30.0  --  30.2  --  30.4  --   --   --   --  30.6 30.0  MCHC 33.5  --  32.8  --  32.5  --   --   --   --  33.3 33.3  RDW 15.2  --  15.2  --  15.3  --   --   --   --  15.1 14.6  < > = values in this interval not displayed.  Chemistries   Recent Labs Lab 07/16/15 0025 07/17/15 0538 07/19/15 0615 07/21/15 0836 07/22/15 0502  NA 132* 136 136 136 137  K 4.6 3.9 3.9 3.6 3.9  CL 109 107 107 107 106  CO2 24 25 24 23 26   GLUCOSE 155* 127* 120* 126* 131*  BUN 23* 22* 24* 21* 23*  CREATININE 1.16* 1.15* 1.13* 0.96 1.05*  CALCIUM 7.1* 7.3* 7.4* 7.5* 7.4*  AST  --   --   --  22  --   ALT  --   --   --  25  --   ALKPHOS  --   --   --  55  --   BILITOT  --   --   --  0.4  --    ------------------------------------------------------------------------------------------------------------------ No results for input(s): CHOL, HDL, LDLCALC, TRIG, CHOLHDL, LDLDIRECT in the last 72 hours.  No results found for: HGBA1C ------------------------------------------------------------------------------------------------------------------ No results for input(s): TSH, T4TOTAL, T3FREE, THYROIDAB in the last 72 hours.  Invalid input(s): FREET3 ------------------------------------------------------------------------------------------------------------------ No results for input(s): VITAMINB12, FOLATE, FERRITIN, TIBC, IRON, RETICCTPCT in the last 72 hours.  Coagulation profile  Recent Labs Lab 07/15/15 1601  INR 1.22    No results for input(s): DDIMER in the last 72 hours.  Cardiac Enzymes No results for input(s): CKMB, TROPONINI,  MYOGLOBIN in the last 168 hours.  Invalid input(s): CK ------------------------------------------------------------------------------------------------------------------ No results found for: BNP  Micro Results No results found for this or any previous visit (from the past 240 hour(s)).  Radiology Reports Ct Abdomen Pelvis Wo Contrast  07/10/2015  CLINICAL DATA:  Acute onset of GI bleeding.  Initial encounter. EXAM: CT ABDOMEN AND PELVIS WITHOUT CONTRAST TECHNIQUE: Multidetector CT imaging of the abdomen and pelvis was performed following the standard protocol without IV contrast. COMPARISON:  CT of the abdomen and pelvis from 06/25/2015 FINDINGS: Trace right-sided pleural fluid is noted, with associated atelectasis. The liver and spleen are unremarkable in appearance. The gallbladder is within normal limits. The pancreas and adrenal glands are unremarkable. Scattered bilateral parapelvic renal cysts are seen. Minimal nonspecific perinephric stranding is noted bilaterally. There is no evidence of hydronephrosis. No renal or ureteral stones are identified. The small bowel  is unremarkable in appearance. The stomach is within normal limits. No acute vascular abnormalities are seen. The patient is status post appendectomy. Vague soft tissue inflammation is noted along the ascending, descending and proximal sigmoid colon, concerning for acute infectious or inflammatory colitis. Underlying diverticulosis is noted along the transverse, descending and sigmoid colon. Trace fluid is seen tracking along the paracolic gutters. The bladder is mildly distended and grossly unremarkable. The patient is status post hysterectomy. The ovaries are grossly symmetric. No suspicious adnexal masses are seen. No inguinal lymphadenopathy is seen. No acute osseous abnormalities are identified. IMPRESSION: 1. Vague soft tissue inflammation along the ascending, descending and proximal sigmoid colon, concerning for acute infectious or  inflammatory colitis. Trace fluid tracking along the paracolic gutters. 2. Underlying diverticulosis along the transverse, descending and sigmoid colon. 3. Scattered bilateral parapelvic renal cysts again noted. 4. Trace right-sided pleural fluid, with associated atelectasis. Electronically Signed   By: Roanna Raider M.D.   On: 07/10/2015 00:00   Dg Chest 2 View  07/19/2015  CLINICAL DATA:  GI bleed, weakness, nonsmoker, long-term use immunosuppressant medication, possible infiltrates EXAM: CHEST  2 VIEW COMPARISON:  None. FINDINGS: Cardiomediastinal silhouette is unremarkable. Mild hyperinflation. No acute infiltrate or pleural effusion. No pulmonary edema. Mild thoracic spine osteopenia. Degenerative changes mid and lower thoracic spine. IMPRESSION: No active disease. Mild hyperinflation. Degenerative changes mid and lower thoracic spine. Electronically Signed   By: Natasha Mead M.D.   On: 07/19/2015 08:16   Ct Abdomen Pelvis W Contrast  06/25/2015  CLINICAL DATA:  Bright red diarrhea this morning. Lower abdominal pain. EXAM: CT ABDOMEN AND PELVIS WITH CONTRAST TECHNIQUE: Multidetector CT imaging of the abdomen and pelvis was performed using the standard protocol following bolus administration of intravenous contrast. CONTRAST:  ISOVUE-300 IOPAMIDOL (ISOVUE-300) INJECTION 61% COMPARISON:  10/18/2010 FINDINGS: Lower chest: Stable 5 mm nodule in the right middle lobe on sequence 4, image 10 appears to have a few calcifications and probably represents a benign granuloma. Stable punctate nodular density near the right minor fissure on sequence 4, image 4. Small amount atelectasis or scarring at the right lung base. Hepatobiliary: Normal appearance of the liver, gallbladder and portal venous system. Pancreas: Fatty changes throughout the pancreas without evidence for inflammation or duct dilatation. Spleen: Normal appearance of spleen without enlargement Adrenals/Urinary Tract: Normal bilateral adrenal  glands. There are bilateral peripelvic cysts without hydronephrosis. Normal urinary bladder. There is probably a punctate nonobstructive right kidney stone in lower pole. Stomach/Bowel: There is extensive colon wall thickening, predominantly involving the transverse colon and hepatic flexure. There is small amount of pericolonic edema near the hepatic flexure. There is extensive diverticulosis involving the descending colon and the sigmoid colon. Difficult to exclude mild inflammation in the descending colon. There is oral contrast in the small bowel and right colon. No evidence for a small bowel obstruction. Normal appearance of the stomach and duodenum. Evidence for vascular engorgement in the omentum. Vascular/Lymphatic: Normal caliber of the abdominal aorta without significant atherosclerotic disease. Few scattered periaortic lymph nodes have not significantly changed. There are prominent lymph nodes along the lymphatic drainage of the sigmoid colon on sequence 2, images 66 and 63. There are enlarged lymph nodes near the left iliac chain which could also be along the lymphatic drainage of the left colon. These lymph nodes are best seen on sequence 2, image 59 and 60. Largest node measures 0.9 cm in the short axis. Reproductive: Uterus has been removed. Evidence for bilateral ovarian tissue without gross  abnormality. Other: Trace amount of free fluid in the pelvis.  No free air. Musculoskeletal: Stable sclerotic density in the right ilium is suggestive for a bone island. No suspicious bone findings. IMPRESSION: Diffuse colonic wall thickening which is most prominent in the transverse colon and hepatic flexure. Findings are suggestive for colitis. This could be an infectious or inflammatory process. There is a small amount of fluid in the pelvis. Extensive diverticulosis involving the sigmoid colon and left colon but this does not appear to be the source for the colonic inflammation. Prominent lymph nodes along  the lymphatic distribution of the sigmoid colon could be reactive. These nodes have enlarged since 2012 and consider a 3-6 month follow-up for surveillance. Bilateral renal cysts without hydronephrosis. Electronically Signed   By: Richarda Overlie M.D.   On: 06/25/2015 10:41    Time Spent in minutes  30   Debbra Riding PA-S on 07/22/2015 at 10:11 AM  Attending MD note  Patient was seen, examined,treatment plan was discussed with the PA-S.  I have personally reviewed the clinical findings, lab, imaging studies and management of this patient in detail. I agree with the documentation, as recorded by the PA-S.   Patient details to have hematochezia. Hemoglobin down to 6.3 this morning, getting 2 units of PRBC (total of 8 unit). Received her Remicade yesterday. We will continue to follow to see if any clinical improvement. Repeat right upper extremity Doppler to see if DVT still present, if so, we will plan on rechecking with oral anticoagulants once GI symptoms are better.  On Exam: Gen. exam: Awake, alert, not in any distress Chest: Good air entry bilaterally, no rhonchi or rales CVS: S1-S2 regular, no murmurs Abdomen: Soft, nontender and nondistended Neurology: Non-focal Skin: No rash or lesions  Plan: Transfuse 2 units today, repeat Doppler right upper extremity tomorrow  Rest as above  [facility]

## 2015-07-22 NOTE — Progress Notes (Signed)
MD modified PRBC order from 1 to a total of 2 units. When this occurred I was unable to chart the rate once the first fifteen minutes was complete. I kept the blood rate going the entire unit at 124ml/hour.

## 2015-07-22 NOTE — Progress Notes (Signed)
CRITICAL VALUE ALERT  Critical value received:  Hgb 6.3  Date of notification:  07/22/15  Time of notification:  0538  Critical value read back: yes  Nurse who received alert:  Oris Drone  MD notified (1st page):  K Schorr  Time of first page:  [phone]  Responding MD:  Merdis Delay  Time MD responded:  [phone]

## 2015-07-22 NOTE — Progress Notes (Signed)
Date:  July 22, 2015 Chart reviewed for concurrent status and case management needs. Will continue to follow the patient for changes and needs:   hgb 6.9 and receiving bld unit Expected discharge date: [id number] Marcelle Smiling, BSN, [place], [place]   [phone]

## 2015-07-22 NOTE — Progress Notes (Signed)
*  PRELIMINARY RESULTS* Echocardiogram 2D Echocardiogram has been performed.  Jeryl Columbia 07/22/2015, 4:01 PM

## 2015-07-22 NOTE — Progress Notes (Signed)
Pt continues to refuse CPAP QHS, RT to monitor and assess as needed.  

## 2015-07-22 NOTE — Progress Notes (Signed)
     [facility] Progress Note  Subjective:  Received first dose of Remicade yesterday.  Hgb down this AM so receiving 2 units PRBC's.  Just had a brown stool in commode currently.  Objective:  Vital signs in last 24 hours: Temp:  [98.3 F (36.8 C)-98.9 F (37.2 C)] 98.4 F (36.9 C) (06/08 0843) Pulse Rate:  [69-94] 83 (06/08 0843) Resp:  [18-22] 18 (06/08 0843) BP: (125-137)/(53-75) 137/75 mmHg (06/08 0843) SpO2:  [97 %-100 %] 99 % (06/08 0843) Weight:  [245 lb 2.4 oz (111.2 kg)-246 lb 14.6 oz (112 kg)] 245 lb 2.4 oz (111.2 kg) (06/08 0514) Last BM Date: 07/22/15 General:  Alert, Well-developed, in NAD Heart:  Regular rate and rhythm; no murmurs Pulm:  CTAB.  No W/R/R. Abdomen:  Soft, non-distended. Normal bowel sounds.  Mild diffuse TTP. Extremities:  Without edema. Neurologic:  Alert and oriented x 4;  grossly normal neurologically. Psych:  Alert and cooperative. Normal mood and affect.  Intake/Output from previous day: 06/07 0701 - 06/08 0700 In: 730 [P.O.:720; I.V.:10] Out: 4125 [Urine:3350; Stool:775] Intake/Output this shift: Total I/O In: 585 [I.V.:250; Blood:335] Out: -   Lab Results:  Recent Labs  07/20/15 2131 07/21/15 0836 07/22/15 0502  WBC  --  4.4 4.7  HGB 7.2* 7.8*  8.2* 6.3*  HCT 20.8* 23.4*  24.3* 18.9*  PLT  --  165 107*   BMET  Recent Labs  07/21/15 0836 07/22/15 0502  NA 136 137  K 3.6 3.9  CL 107 106  CO2 23 26  GLUCOSE 126* 131*  BUN 21* 23*  CREATININE 0.96 1.05*  CALCIUM 7.5* 7.4*   LFT  Recent Labs  07/21/15 0836  PROT 4.3*  ALBUMIN 1.7*  AST 22  ALT 25  ALKPHOS 55  BILITOT 0.4   Assessment / Plan: 1. Colitis, Crohn's by repeat biopsies from 6/6:  Continue IV Solumedrol 60 mg daily for now.  Received first dose of Remicade 6/8 and is set up for her next two induction doses. 2. Acute blood loss anemia: Hgb down to 6.3 grams this AM.  Receiving 2 units PRBC's today. 3. RUE DVT: IV heparin on hold.   Will recheck doppler 6/9 per primary service.   LOS: 13 days   ZEHR, JESSICA D.  07/22/2015, 9:08 AM  Pager number [phone]          [facility] Attending   I have taken an interval history, reviewed the chart and examined the patient. I agree with the Advanced Practitioner's note, impression and recommendations.    Some improvement w/ current Rx Got more blood but stools brown  D/W Dr. Dina Rich and will US DVT area again to help decide tx plan there  Hopefully home in a few days  Consider parenteral iron  Iva Boop, MD, [facility] [phone] (pager) [phone] after 5 PM, weekends and holidays  07/22/2015 5:15 PM

## 2015-07-22 NOTE — Progress Notes (Signed)
PT Cancellation Note  Patient Details Name: Jamie Wood MRN: [id number] DOB: 1945/08/17   Cancelled Treatment:    Reason Eval/Treat Not Completed: Medical issues which prohibited therapy--low Hgb. pt to receive transfusion. Will check back another day.    Rebeca Alert, MPT Pager: [phone]

## 2015-07-23 ENCOUNTER — Inpatient Hospital Stay (HOSPITAL_COMMUNITY): Payer: Medicare Other

## 2015-07-23 DIAGNOSIS — I82401 Acute embolism and thrombosis of unspecified deep veins of right lower extremity: Secondary | ICD-10-CM

## 2015-07-23 DIAGNOSIS — E8770 Fluid overload, unspecified: Secondary | ICD-10-CM

## 2015-07-23 DIAGNOSIS — K50119 Crohn's disease of large intestine with unspecified complications: Secondary | ICD-10-CM

## 2015-07-23 LAB — BASIC METABOLIC PANEL
ANION GAP: 4 — AB (ref 5–15)
BUN: 30 mg/dL — ABNORMAL HIGH (ref 6–20)
CALCIUM: 7.6 mg/dL — AB (ref 8.9–10.3)
CO2: 28 mmol/L (ref 22–32)
Chloride: 105 mmol/L (ref 101–111)
Creatinine, Ser: 1.1 mg/dL — ABNORMAL HIGH (ref 0.44–1.00)
GFR calc non Af Amer: 50 mL/min — ABNORMAL LOW (ref >60)
GFR, EST AFRICAN AMERICAN: 58 mL/min — AB (ref >60)
Glucose, Bld: 147 mg/dL — ABNORMAL HIGH (ref 65–99)
Potassium: 4 mmol/L (ref 3.5–5.1)
Sodium: 137 mmol/L (ref 135–145)

## 2015-07-23 LAB — CBC
HEMATOCRIT: 26.6 % — AB (ref 36.0–46.0)
Hemoglobin: 9 g/dL — ABNORMAL LOW (ref 12.0–15.0)
MCH: 29.8 pg (ref 26.0–34.0)
MCHC: 33.8 g/dL (ref 30.0–36.0)
MCV: 88.1 fL (ref 78.0–100.0)
Platelets: 124 10*3/uL — ABNORMAL LOW (ref 150–400)
RBC: 3.02 MIL/uL — AB (ref 3.87–5.11)
RDW: 16.5 % — ABNORMAL HIGH (ref 11.5–15.5)
WBC: 4.2 10*3/uL (ref 4.0–10.5)

## 2015-07-23 LAB — TYPE AND SCREEN
ABO/RH(D): A POS
Antibody Screen: NEGATIVE
Unit division: 0
Unit division: 0

## 2015-07-23 LAB — C-REACTIVE PROTEIN: CRP: 7.2 mg/dL — ABNORMAL HIGH (ref <1.0)

## 2015-07-23 LAB — GLUCOSE, CAPILLARY
GLUCOSE-CAPILLARY: 127 mg/dL — AB (ref 65–99)
GLUCOSE-CAPILLARY: 139 mg/dL — AB (ref 65–99)
Glucose-Capillary: 207 mg/dL — ABNORMAL HIGH (ref 65–99)
Glucose-Capillary: 183 mg/dL — ABNORMAL HIGH (ref 65–99)

## 2015-07-23 LAB — HEPARIN LEVEL (UNFRACTIONATED): HEPARIN UNFRACTIONATED: 0.22 [IU]/mL — AB (ref 0.30–0.70)

## 2015-07-23 MED ORDER — POTASSIUM CHLORIDE CRYS ER 20 MEQ PO TBCR
40.0000 meq | EXTENDED_RELEASE_TABLET | Freq: Once | ORAL | Status: AC
  Administered 2015-07-23: 40 meq via ORAL
  Filled 2015-07-23: qty 2

## 2015-07-23 MED ORDER — HEPARIN (PORCINE) IN NACL 100-0.45 UNIT/ML-% IJ SOLN
1100.0000 [IU]/h | INTRAMUSCULAR | Status: DC
  Administered 2015-07-23: 1100 [IU]/h via INTRAVENOUS
  Filled 2015-07-23: qty 250

## 2015-07-23 MED ORDER — HEPARIN (PORCINE) IN NACL 100-0.45 UNIT/ML-% IJ SOLN
1250.0000 [IU]/h | INTRAMUSCULAR | Status: DC
  Filled 2015-07-23 (×2): qty 250

## 2015-07-23 MED ORDER — FUROSEMIDE 10 MG/ML IJ SOLN
40.0000 mg | Freq: Once | INTRAMUSCULAR | Status: AC
  Administered 2015-07-23: 40 mg via INTRAVENOUS
  Filled 2015-07-23: qty 4

## 2015-07-23 MED ORDER — PREDNISONE 20 MG PO TABS: 40.0000 mg | ORAL_TABLET | Freq: Every day | ORAL | Status: DC

## 2015-07-23 NOTE — Progress Notes (Signed)
Physical Therapy Treatment Patient Details Name: Jamie Wood MRN: [id number] DOB: [birth year] Today's Date: 07/23/2015    History of Present Illness Jamie Wood is an 70 y.o. female past medical history no GI discomfort with a recent endoscopy and 07/03/2015 that was suggestive  of possible inflammatory colitis however biopsies are not revealed inflammation, patient was treated empirically steroids, and previous GI pathogen was unremarkable the comes into the ED complaining of abdominal pain and bloody diarrhea for a month which has progressively gotten worse now she feels dizzy and lightheaded    PT Comments    Pt OOB in bathroom.  Assisted with hygiene due to mild balance difficulty.   Assisted with amb in hallway.  Pt required one standing rest break.  Fatigues quickly but highly motivated. Assisted to recliner and positioned to comfort.  Pt moving well and often to and from the bathroom.   Follow Up Recommendations  No PT follow up     Equipment Recommendations       Recommendations for Other Services       Precautions / Restrictions Precautions Precautions: Fall Restrictions Weight Bearing Restrictions: No    Mobility  Bed Mobility               General bed mobility comments: OOB in bathroom  Transfers Overall transfer level: Needs assistance Equipment used: None Transfers: Sit to/from Stand Sit to Stand: Supervision;Min guard         General transfer comment: for safety.  assisted in bathroom with hygiene due to balance requiring to hold to rail  Ambulation/Gait Ambulation/Gait assistance: Min guard;Supervision Ambulation Distance (Feet): 120 Feet Assistive device: None Gait Pattern/deviations: Step-to pattern;Step-through pattern Gait velocity: decreased   General Gait Details: slightly unsteady at times. intermittent close guarding. Pt fatigues fairly easily.   Stairs            Wheelchair Mobility    Modified Rankin (Stroke Patients  Only)       Balance                                    Cognition Arousal/Alertness: Awake/alert Behavior During Therapy: WFL for tasks assessed/performed Overall Cognitive Status: Within Functional Limits for tasks assessed                      Exercises      General Comments        Pertinent Vitals/Pain Pain Assessment: No/denies pain    Home Living                      Prior Function            PT Goals (current goals can now be found in the care plan section) Progress towards PT goals: Progressing toward goals    Frequency  Min 3X/week    PT Plan Current plan remains appropriate    Co-evaluation             End of Session Equipment Utilized During Treatment: Gait belt Activity Tolerance: Patient tolerated treatment well Patient left: in chair;with call bell/phone within reach;with family/visitor present     Time: 6237-6283 PT Time Calculation (min) (ACUTE ONLY): 26 min  Charges:  $Gait Training: 8-22 mins $Therapeutic Activity: 8-22 mins                    G Codes:  Rica Koyanagi  PTA [facility]  Acute  Rehab Pager      [phone]

## 2015-07-23 NOTE — Progress Notes (Signed)
ANTICOAGULATION CONSULT NOTE - Initial Consult  Pharmacy Consult for Heparin IV Indication: DVT  Allergies  Allergen Reactions  . Benadryl [Diphenhydramine Hcl] Other (See Comments)    Jittery  . Codeine Nausea And Vomiting    Pt is unable to recall all reactions to codeine  . Meloxicam Other (See Comments)    Due to stage III kidney disease  . Penicillins     Has patient had a PCN reaction causing immediate rash, facial/tongue/throat swelling, SOB or lightheadedness with hypotension: unknown Has patient had a PCN reaction causing severe rash involving mucus membranes or skin necrosis: unknown Has patient had a PCN reaction that required hospitalization: yes, drs visit Has patient had a PCN reaction occurring within the last 10 years: yes If all of the above answers are "NO", then may proceed with Cephalosporin use.   Marland Kitchen Propoxyphene Hcl Nausea And Vomiting  . Tramadol Nausea Only    jittery    Patient Measurements: Height:  (162.6 cm) Weight: 243 lb 13.3 oz (110.6 kg) IBW/kg (Calculated) : 54.7 Heparin Dosing Weight: 81.5 kg  Vital Signs: Temp: 97.8 F (36.6 C) (06/09 0527) Temp Source: Oral (06/09 0527) BP: 145/75 mmHg (06/09 0527) Pulse Rate: 69 (06/09 0527)  Labs:  Recent Labs  07/21/15 0836 07/22/15 0502 07/23/15 0506  HGB 7.8*  8.2* 6.3* 9.0*  HCT 23.4*  24.3* 18.9* 26.6*  PLT 165 107* 124*  CREATININE 0.96 1.05* 1.10*    Estimated Creatinine Clearance: 59.6 mL/min (by C-G formula based on Cr of 1.1).   Medications:  Scheduled:  . sodium chloride   Intravenous Once  . ALPRAZolam  0.25 mg Oral Daily  . cholecalciferol  5,000 Units Oral q morning - 10a  . diphenoxylate-atropine  1 tablet Oral TID AC & HS  . feeding supplement (PRO-STAT SUGAR FREE 64)  30 mL Oral TID  . folic acid  1 mg Oral Daily  . furosemide  40 mg Intravenous Once  . insulin aspart  0-20 Units Subcutaneous TID WC  . levothyroxine  100 mcg Oral QAC breakfast  .  methylPREDNISolone (SOLU-MEDROL) injection  60 mg Intravenous Daily  . ondansetron  4 mg Intravenous BID  . pantoprazole  40 mg Oral Daily  . potassium chloride  40 mEq Oral Once  . simvastatin  20 mg Oral q1800  . sodium chloride flush  10-40 mL Intracatheter Q12H    Assessment: 70 yo female with new diagnosis of suspected ulcerative colitis. She had a PICC placed for TPN 5/28-5/30. Right upper extremity doppler completed 6/1 which showed a partially occlusive DVT in the axillary vein felt related to PICC. Pharmacy is consulted to dose IV heparin. Due to worsening Hgb and blood in stools on anticoag, heparin was stopped 6/4. Per MD, improved today after PRBC x 2 and would like to resume heparin.  Significant events: 6/2: Small amts blood noted in BMs overnight 6/3: Bloody diarrhea persists, increased since anticoagulation started per GI notes 6/4: x4 bloody stools per RN; heparin stopped 6/7: received 1st dose of Remicade  Today, 07/23/2015:  Repeat doppler shows persistent RUE DVT  CBC: improved after PRBC, Plt low but also improved since yesterday  Most recent heparin level slightly supratherapeutic on 1200 units/hr (6/4)  2 BMs, but not sure if blood present  CrCl:   Goal of Therapy: Heparin level 0.3-0.5 units/ml Monitor platelets by anticoagulation protocol: Yes  Plan:  Heparin 1100 units/hr IV infusion based on prior levels  Check heparin level 6 hrs after  start  Daily CBC, daily heparin level once stable  Monitor closely for signs of bleeding or thrombosis  Plan for switch to PO anticoag tomorrow 6/10   Bernadene Person, PharmD Pager: [phone] 07/23/2015, 9:57 AM

## 2015-07-23 NOTE — Progress Notes (Signed)
     [facility] Progress Note  Subjective:  Had one BM overnight and one this AM.  Says that she is not sure whether there was blood bc she did not look.  Hgb 9.0 grams today after 2 units PRBC's yesterday.  Still has DVT in RUE per repeat doppler today.  Objective:  Vital signs in last 24 hours: Temp:  [97.8 F (36.6 C)-98.7 F (37.1 C)] 97.8 F (36.6 C) (06/09 0527) Pulse Rate:  [67-86] 69 (06/09 0527) Resp:  [18-20] 20 (06/09 0527) BP: (140-148)/(62-76) 145/75 mmHg (06/09 0527) SpO2:  [97 %-100 %] 100 % (06/09 0527) Weight:  [243 lb 13.3 oz (110.6 kg)] 243 lb 13.3 oz (110.6 kg) (06/09 0747) Last BM Date: 07/22/15 General:  Alert, Well-developed, in NAD Heart:  Regular rate and rhythm; no murmurs Pulm:  CTAB.  No W/R/R. Abdomen:  Soft, non-distended.  BS present.  Mild diffuse TTP. Extremities:  Without edema. Neurologic:  Alert and oriented x 4;  grossly normal neurologically. Psych:  Alert and cooperative. Normal mood and affect.  Intake/Output from previous day: 06/08 0701 - 06/09 0700 In: 1342 [P.O.:480; I.V.:250; Blood:612] Out: 4025 [Urine:3300; Stool:725]  Lab Results:  Recent Labs  07/21/15 0836 07/22/15 0502 07/23/15 0506  WBC 4.4 4.7 4.2  HGB 7.8*  8.2* 6.3* 9.0*  HCT 23.4*  24.3* 18.9* 26.6*  PLT 165 107* 124*   BMET  Recent Labs  07/21/15 0836 07/22/15 0502 07/23/15 0506  NA 136 137 137  K 3.6 3.9 4.0  CL 107 106 105  CO2 GLUCOSE 126* 131* 147*  BUN 21* 23* 30*  CREATININE 0.96 1.05* 1.10*  CALCIUM 7.5* 7.4* 7.6*   LFT  Recent Labs  07/21/15 0836  PROT 4.3*  ALBUMIN 1.7*  AST 22  ALT 25  ALKPHOS 55  BILITOT 0.4   Assessment / Plan: 1. Colitis, Crohn's by repeat biopsies from 6/6: Continue IV Solumedrol 60 mg daily for now. Received first dose of Remicade 6/8 and is set up for her next two induction doses.  Seems to be improving clinically. 2. Acute blood loss anemia: Hgb improved after 2 units PRBC's  yesterday.  Consider dose of IV iron as inpatient. 3. RUE DVT:  Repeat doppler shows that DVT remains.  She is going to be placed on IV heparin and then switched to PO anticoagulation 6/10.   LOS: 14 days   ZEHR, JESSICA D.  07/23/2015, 8:55 AM  Pager number [phone]      GI Attending   I have taken an interval history, reviewed the chart and examined the patient. I agree with the Advanced Practitioner's note, impression and recommendations.   Definitely improved - may be Lomotil helping more than other Rx but probably both.  Will see how she does with A/C Tx  Convert to po steroids tomorrow  We will see her Monday - Call Dr. Loreta Ave over w/e if needed  If she goes home send me a staff message and we will arrange any other f/u  Leave on prednisone 40 mg  Reduce Lomotil make it prn if BM's stay this way or lessen  If bleeding sig then no A/c tx  Iva Boop, MD, [facility] [phone] (pager) [phone] after 5 PM, weekends and holidays  07/23/2015 2:31 PM

## 2015-07-23 NOTE — Progress Notes (Signed)
VASCULAR LAB PRELIMINARY  PRELIMINARY  PRELIMINARY  PRELIMINARY  Right upper extremity venous duplex completed.    Right:  DVT noted in the axillary vein.  Gave results to Allen, RN@08 :55 am     Malayna Noori, RVT, RDMS 07/23/2015, 9:03 AM

## 2015-07-23 NOTE — Progress Notes (Signed)
PROGRESS NOTE                                                                                                                                                                                                             Patient Demographics:    Jamie Wood, is a 70 y.o. female, DOB - April 07, 1945, RWE:[id number]  Admit date - 07/09/2015   Admitting Physician Courage Mariea Clonts, MD  Outpatient Primary MD for the patient is Cala Bradford, MD  LOS - 14  Chief Complaint  Patient presents with  . Abdominal Pain  . Abnormal Lab       Brief Narrative  Jamie Wood is an 70 y.o. female with abdominal pain and diarrhea for about 1 month with a recent endoscopy and 07/03/2015 with biopsies revealing moderate inflammation in rectum and sigmoid areas.Treated empirically with anti-inflammatories, steroids without any response. Hospital course has been prolonged and complicated by persistent diarrhea with hematochezia and acute blood loss anemia requiring numerous PRBC transfusion. Repeat colonoscopy on 6/6 showed severe patchy inflammation in the entire colon, preliminary biopsy results consistent with Crohn's disease. Began Remicade on 6/7 with improvement in hematochezia and diarrhea.     Subjective:    Significant decrease in bowel movements. Feels better today.   Assessment  & Plan :    Crohn's disease with exacerbation: Ongoing diarrhea with hematochezia for almost 1 month, resistant to anti-inflammatories and IV steroids. Repeat colonoscopy on 6/6 shows significant inflammation in the entire colon, biopsy results consistent with Crohn's disease (ulcerative colitis). Continues to be on IV Solu-Medrol, GI began Remicade on 6/7. Symptoms are improving, with less bowel movements and will continue to monitor.  Acute blood loss anemia: Secondary to persistent hematochezia due to above. Given total of 8 units PRBC during hospitliation.  Currently Hb 9, continue to follow and transfuse as needed.  DVT right arm associated with PICC line: Initially anticoagulated with IV heparin, but discontinued due to worsening hematochezia requiring PRBC transfusion. PICC line has been removed, plans were to monitor hematochezia and once improved start anticoagulation. Repeat upper extremity Doppler on 6/9 shows DVT, since diarrhea and hematochezia have significantly improved-discussed with GI team, we'll start heparin and monitor closely. Have asked pharmacy to maintain heparin levels are lower therapeutic range.   AKI: Resolved, likely mild prerenal azotemia in a setting of diarrhea. Follow  electrolytes periodically.  Dyslipidemia: Continue statin  Hypothyroidism: Continue levothyroxine  Lower extremity edema: Suspect secondary to hypoalbuminemia, UA without proteinuria. Echo shows no heart failure with an EF of 60-65%. Continue diuresis with Lasix and follow weights.  Severe protein calorie malnutrition with anasarca: Continue supplements, previously on TNA- stopped (no PICC)  Tricuspid valve regurgitation: Found on Echocardiogram on 6/8 showing trivial regurgitation. Follow up as outpatient with PCP  Code Status :  Full  Family Communication  :  No family in room-patient is awake and alert and understanding of the above noted plan.  Disposition Plan  : Home when bloody stools improve-requires several more days of hospitalization  Consults  :  GI  Procedures  :  Colonoscopy 6/6  DVT Prophylaxis  :  Heparin and  SCDs   Lab Results  Component Value Date   PLT 124* 07/23/2015    Inpatient Medications  Scheduled Meds: . sodium chloride   Intravenous Once  . ALPRAZolam  0.25 mg Oral Daily  . cholecalciferol  5,000 Units Oral q morning - 10a  . diphenoxylate-atropine  1 tablet Oral TID AC & HS  . feeding supplement (PRO-STAT SUGAR FREE 64)  30 mL Oral TID  . folic acid  1 mg Oral Daily  . insulin aspart  0-20 Units  Subcutaneous TID WC  . levothyroxine  100 mcg Oral QAC breakfast  . methylPREDNISolone (SOLU-MEDROL) injection  60 mg Intravenous Daily  . ondansetron  4 mg Intravenous BID  . pantoprazole  40 mg Oral Daily  . simvastatin  20 mg Oral q1800  . sodium chloride flush  10-40 mL Intracatheter Q12H   Continuous Infusions: . heparin 1,100 Units/hr (07/23/15 1034)   PRN Meds:.acetaminophen **OR** acetaminophen, albuterol, menthol-cetylpyridinium, morphine injection, ondansetron **OR** ondansetron (ZOFRAN) IV, oxyCODONE, sodium chloride flush, traZODone  Antibiotics  :    Anti-infectives    Start     Dose/Rate Route Frequency Ordered Stop   07/10/15 0330  metroNIDAZOLE (FLAGYL) IVPB 500 mg  Status:  Discontinued     500 mg 100 mL/hr over 60 Minutes Intravenous Every 8 hours 07/10/15 0241 07/14/15 0914   07/10/15 0300  ciprofloxacin (CIPRO) IVPB 400 mg  Status:  Discontinued     400 mg 200 mL/hr over 60 Minutes Intravenous 2 times daily 07/10/15 0241 07/14/15 0914         Objective:   Filed Vitals:   07/22/15 1410 07/22/15 2105 07/23/15 0527 07/23/15 0747  BP: 140/62 146/67 145/75   Pulse: 81 67 69   Temp: 98.5 F (36.9 C) 98.5 F (36.9 C) 97.8 F (36.6 C)   TempSrc: Oral Oral Oral   Resp: 18 18 20    Height:      Weight:    110.6 kg (243 lb 13.3 oz)  SpO2: 97% 98% 100%     Wt Readings from Last 3 Encounters:  07/23/15 110.6 kg (243 lb 13.3 oz)  07/06/15 104.327 kg (230 lb)  06/29/15 104.327 kg (230 lb)     Intake/Output Summary (Last 24 hours) at 07/23/15 1133 Last data filed at 07/23/15 1025  Gross per 24 hour  Intake    767 ml  Output   4325 ml  Net  -3558 ml     Physical Exam  Awake Alert, Oriented X 3, No new F.N deficits, Normal affect Lucas.AT,PERRAL Supple Neck,No JVD, No cervical lymphadenopathy appriciated.  Symmetrical Chest wall movement, Good air movement bilaterally, CTAB RRR,No Gallops,Rubs or new Murmurs, No Parasternal Heave +ve B.Sounds, Abd  Soft, No tenderness, No organomegaly appriciated, No rebound - guarding or rigidity. + peripheral edema , No Cyanosis, Clubbing, No new Rash or bruise.    Data Review:    CBC  Recent Labs Lab 07/17/15 0538  07/18/15 1610  07/20/15 1014 07/20/15 2131 07/21/15 0836 07/22/15 0502 07/23/15 0506  WBC 6.6  --  8.7  --   --   --  4.4 4.7 4.2  HGB 8.0*  < > 6.8*  < > 8.6* 7.2* 7.8*  8.2* 6.3* 9.0*  HCT 24.4*  < > 20.9*  < > 25.3* 20.8* 23.4*  24.3* 18.9* 26.6*  PLT 184  --  207  --   --   --  165 107* 124*  MCV 92.1  --  93.3  --   --   --  91.8 90.0 88.1  MCH 30.2  --  30.4  --   --   --  30.6 30.0 29.8  MCHC 32.8  --  32.5  --   --   --  33.3 33.3 33.8  RDW 15.2  --  15.3  --   --   --  15.1 14.6 16.5*  < > = values in this interval not displayed.  Chemistries   Recent Labs Lab 07/17/15 0538 07/19/15 0615 07/21/15 0836 07/22/15 0502 07/23/15 0506  NA 136 136 136 137 137  K 3.9 3.9 3.6 3.9 4.0  CL 107 107 107 106 105  CO2 GLUCOSE 127* 120* 126* 131* 147*  BUN 22* 24* 21* 23* 30*  CREATININE 1.15* 1.13* 0.96 1.05* 1.10*  CALCIUM 7.3* 7.4* 7.5* 7.4* 7.6*  AST  --   --  22  --   --   ALT  --   --  25  --   --   ALKPHOS  --   --  55  --   --   BILITOT  --   --  0.4  --   --    ------------------------------------------------------------------------------------------------------------------ No results for input(s): CHOL, HDL, LDLCALC, TRIG, CHOLHDL, LDLDIRECT in the last 72 hours.  No results found for: HGBA1C ------------------------------------------------------------------------------------------------------------------ No results for input(s): TSH, T4TOTAL, T3FREE, THYROIDAB in the last 72 hours.  Invalid input(s): FREET3 ------------------------------------------------------------------------------------------------------------------ No results for input(s): VITAMINB12, FOLATE, FERRITIN, TIBC, IRON, RETICCTPCT in the last 72 hours.  Coagulation  profile No results for input(s): INR, PROTIME in the last 168 hours.  No results for input(s): DDIMER in the last 72 hours.  Cardiac Enzymes No results for input(s): CKMB, TROPONINI, MYOGLOBIN in the last 168 hours.  Invalid input(s): CK ------------------------------------------------------------------------------------------------------------------ No results found for: BNP  Micro Results No results found for this or any previous visit (from the past 240 hour(s)).  Radiology Reports Ct Abdomen Pelvis Wo Contrast  07/10/2015  CLINICAL DATA:  Acute onset of GI bleeding.  Initial encounter. EXAM: CT ABDOMEN AND PELVIS WITHOUT CONTRAST TECHNIQUE: Multidetector CT imaging of the abdomen and pelvis was performed following the standard protocol without IV contrast. COMPARISON:  CT of the abdomen and pelvis from 06/25/2015 FINDINGS: Trace right-sided pleural fluid is noted, with associated atelectasis. The liver and spleen are unremarkable in appearance. The gallbladder is within normal limits. The pancreas and adrenal glands are unremarkable. Scattered bilateral parapelvic renal cysts are seen. Minimal nonspecific perinephric stranding is noted bilaterally. There is no evidence of hydronephrosis. No renal or ureteral stones are identified. The small bowel is unremarkable in appearance. The stomach is within normal limits. No acute  vascular abnormalities are seen. The patient is status post appendectomy. Vague soft tissue inflammation is noted along the ascending, descending and proximal sigmoid colon, concerning for acute infectious or inflammatory colitis. Underlying diverticulosis is noted along the transverse, descending and sigmoid colon. Trace fluid is seen tracking along the paracolic gutters. The bladder is mildly distended and grossly unremarkable. The patient is status post hysterectomy. The ovaries are grossly symmetric. No suspicious adnexal masses are seen. No inguinal lymphadenopathy is  seen. No acute osseous abnormalities are identified. IMPRESSION: 1. Vague soft tissue inflammation along the ascending, descending and proximal sigmoid colon, concerning for acute infectious or inflammatory colitis. Trace fluid tracking along the paracolic gutters. 2. Underlying diverticulosis along the transverse, descending and sigmoid colon. 3. Scattered bilateral parapelvic renal cysts again noted. 4. Trace right-sided pleural fluid, with associated atelectasis. Electronically Signed   By: Roanna Raider M.D.   On: 07/10/2015 00:00   Dg Chest 2 View  07/19/2015  CLINICAL DATA:  GI bleed, weakness, nonsmoker, long-term use immunosuppressant medication, possible infiltrates EXAM: CHEST  2 VIEW COMPARISON:  None. FINDINGS: Cardiomediastinal silhouette is unremarkable. Mild hyperinflation. No acute infiltrate or pleural effusion. No pulmonary edema. Mild thoracic spine osteopenia. Degenerative changes mid and lower thoracic spine. IMPRESSION: No active disease. Mild hyperinflation. Degenerative changes mid and lower thoracic spine. Electronically Signed   By: Natasha Mead M.D.   On: 07/19/2015 08:16   Ct Abdomen Pelvis W Contrast  06/25/2015  CLINICAL DATA:  Bright red diarrhea this morning. Lower abdominal pain. EXAM: CT ABDOMEN AND PELVIS WITH CONTRAST TECHNIQUE: Multidetector CT imaging of the abdomen and pelvis was performed using the standard protocol following bolus administration of intravenous contrast. CONTRAST:  ISOVUE-300 IOPAMIDOL (ISOVUE-300) INJECTION 61% COMPARISON:  10/18/2010 FINDINGS: Lower chest: Stable 5 mm nodule in the right middle lobe on sequence 4, image 10 appears to have a few calcifications and probably represents a benign granuloma. Stable punctate nodular density near the right minor fissure on sequence 4, image 4. Small amount atelectasis or scarring at the right lung base. Hepatobiliary: Normal appearance of the liver, gallbladder and portal venous system. Pancreas: Fatty  changes throughout the pancreas without evidence for inflammation or duct dilatation. Spleen: Normal appearance of spleen without enlargement Adrenals/Urinary Tract: Normal bilateral adrenal glands. There are bilateral peripelvic cysts without hydronephrosis. Normal urinary bladder. There is probably a punctate nonobstructive right kidney stone in lower pole. Stomach/Bowel: There is extensive colon wall thickening, predominantly involving the transverse colon and hepatic flexure. There is small amount of pericolonic edema near the hepatic flexure. There is extensive diverticulosis involving the descending colon and the sigmoid colon. Difficult to exclude mild inflammation in the descending colon. There is oral contrast in the small bowel and right colon. No evidence for a small bowel obstruction. Normal appearance of the stomach and duodenum. Evidence for vascular engorgement in the omentum. Vascular/Lymphatic: Normal caliber of the abdominal aorta without significant atherosclerotic disease. Few scattered periaortic lymph nodes have not significantly changed. There are prominent lymph nodes along the lymphatic drainage of the sigmoid colon on sequence 2, images 66 and 63. There are enlarged lymph nodes near the left iliac chain which could also be along the lymphatic drainage of the left colon. These lymph nodes are best seen on sequence 2, image 59 and 60. Largest node measures 0.9 cm in the short axis. Reproductive: Uterus has been removed. Evidence for bilateral ovarian tissue without gross abnormality. Other: Trace amount of free fluid in the pelvis.  No  free air. Musculoskeletal: Stable sclerotic density in the right ilium is suggestive for a bone island. No suspicious bone findings. IMPRESSION: Diffuse colonic wall thickening which is most prominent in the transverse colon and hepatic flexure. Findings are suggestive for colitis. This could be an infectious or inflammatory process. There is a small amount of  fluid in the pelvis. Extensive diverticulosis involving the sigmoid colon and left colon but this does not appear to be the source for the colonic inflammation. Prominent lymph nodes along the lymphatic distribution of the sigmoid colon could be reactive. These nodes have enlarged since 2012 and consider a 3-6 month follow-up for surveillance. Bilateral renal cysts without hydronephrosis. Electronically Signed   By: Richarda Overlie M.D.   On: 06/25/2015 10:41    Time Spent in minutes  30   Debbra Riding PA-S on 07/23/2015 at 11:33 AM   Attending MD note  Patient was seen, examined,treatment plan was discussed with the PA-S.  I have personally reviewed the clinical findings, lab, imaging studies and management of this patient in detail. I agree with the documentation, as recorded by the PA-S.   Patient is doing much better, significant decrease in diarrhea and hematochezia.  On Exam: Gen. exam: Awake, alert, not in any distress Chest: Good air entry bilaterally, no rhonchi or rales CVS: S1-S2 regular, no murmurs Abdomen: Soft, nontender and nondistended Neurology: Non-focal Skin: No rash or lesions  Impression: Crohn's disease with exacerbation-resolving with initiation of Remicade Acute blood loss anemia Volume overload Right upper extremity DVT   Plan Since the diarrhea and hematochezia resolving-suspect safe to restart IV heparin and see what happens. If no major hematochezia, and hemoglobin continues to be stable, suspect we could transition to oral anticoagulant in the next few days.  Rest as above  [facility]

## 2015-07-23 NOTE — Care Management Important Message (Signed)
Important Message  Patient Details  Name: MASEN WINDELL MRN: [id number] Date of Birth: 1945-10-05   Medicare Important Message Given:  Yes    Sereta, Gaver 07/23/2015, 11:02 AMImportant Message  Patient Details  Name: CHRIST ERLICH MRN: [id number] Date of Birth: 03/18/1945   Medicare Important Message Given:  Yes    Carloyn, Schulke 07/23/2015, 11:02 AM

## 2015-07-23 NOTE — Progress Notes (Signed)
PHARMACIST - PHYSICIAN COMMUNICATION CONCERNING:  IV heparin  RECOMMENDATION: 68 yoF restarted on IV heparin for persistent RUE DVT.  Please see note from Bernadene Person, PharmD, from earlier today for more details.   Heparin infusion resumed at 1100 units/hr  First left slightly subtherapeutic @ 0.22.  No bleeding or infusion issues per RN. Note LOWER heparin goal 0.3-0.5.    DESCRIPTION: Increase to heparin 1250 units/hr = 12.56ml/hr.  F/u heparin level in 6 hours. Monitor for signs/symptoms of bleeding.    Haynes Hoehn, PharmD, BCPS 07/23/2015, 6:17 PM  Pager: [phone]

## 2015-07-24 LAB — GLUCOSE, CAPILLARY
GLUCOSE-CAPILLARY: 105 mg/dL — AB (ref 65–99)
GLUCOSE-CAPILLARY: 116 mg/dL — AB (ref 65–99)
GLUCOSE-CAPILLARY: 132 mg/dL — AB (ref 65–99)
GLUCOSE-CAPILLARY: 122 mg/dL — AB (ref 65–99)

## 2015-07-24 LAB — CBC
HEMATOCRIT: 25.8 % — AB (ref 36.0–46.0)
HEMOGLOBIN: 8.8 g/dL — AB (ref 12.0–15.0)
MCH: 29.6 pg (ref 26.0–34.0)
MCHC: 34.1 g/dL (ref 30.0–36.0)
MCV: 86.9 fL (ref 78.0–100.0)
Platelets: 127 10*3/uL — ABNORMAL LOW (ref 150–400)
RBC: 2.97 MIL/uL — AB (ref 3.87–5.11)
RDW: 15.9 % — ABNORMAL HIGH (ref 11.5–15.5)
WBC: 4.5 10*3/uL (ref 4.0–10.5)

## 2015-07-24 LAB — BASIC METABOLIC PANEL
ANION GAP: 5 (ref 5–15)
BUN: 32 mg/dL — ABNORMAL HIGH (ref 6–20)
CHLORIDE: 102 mmol/L (ref 101–111)
CO2: 29 mmol/L (ref 22–32)
Calcium: 7.6 mg/dL — ABNORMAL LOW (ref 8.9–10.3)
Creatinine, Ser: 1.32 mg/dL — ABNORMAL HIGH (ref 0.44–1.00)
GFR calc non Af Amer: 40 mL/min — ABNORMAL LOW (ref >60)
GFR, EST AFRICAN AMERICAN: 47 mL/min — AB (ref >60)
Glucose, Bld: 161 mg/dL — ABNORMAL HIGH (ref 65–99)
POTASSIUM: 4.1 mmol/L (ref 3.5–5.1)
SODIUM: 136 mmol/L (ref 135–145)

## 2015-07-24 LAB — HEPARIN LEVEL (UNFRACTIONATED)
HEPARIN UNFRACTIONATED: 0.38 [IU]/mL (ref 0.30–0.70)
HEPARIN UNFRACTIONATED: 0.34 [IU]/mL (ref 0.30–0.70)

## 2015-07-24 LAB — C-REACTIVE PROTEIN: CRP: 5.7 mg/dL — AB (ref <1.0)

## 2015-07-24 MED ORDER — APIXABAN 5 MG PO TABS: 5.0000 mg | ORAL_TABLET | Freq: Two times a day (BID) | ORAL | Status: DC

## 2015-07-24 MED ORDER — APIXABAN 5 MG PO TABS
10.0000 mg | ORAL_TABLET | Freq: Two times a day (BID) | ORAL | Status: DC
  Administered 2015-07-24 – 2015-07-28 (×9): 10 mg via ORAL
  Filled 2015-07-24 (×12): qty 2

## 2015-07-24 NOTE — Progress Notes (Addendum)
PROGRESS NOTE                                                                                                                                                                                                             Patient Demographics:    Jamie Wood, is a 70 y.o. female, DOB - 12/13/1945, ZOX:[id number]  Admit date - 07/09/2015   Admitting Physician Courage Mariea Clonts, MD  Outpatient Primary MD for the patient is Cala Bradford, MD  LOS - 15  Chief Complaint  Patient presents with  . Abdominal Pain  . Abnormal Lab       Brief Narrative  Jamie Wood is an 70 y.o. female with abdominal pain and diarrhea for about 1 month with a recent endoscopy and 07/03/2015 with biopsies revealing moderate inflammation in rectum and sigmoid areas.Treated empirically with anti-inflammatories, steroids without any response. Hospital course has been prolonged and complicated by persistent diarrhea with hematochezia and acute blood loss anemia requiring numerous PRBC transfusion. Repeat colonoscopy on 6/6 showed severe patchy inflammation in the entire colon, preliminary biopsy results consistent with Crohn's disease. Began Remicade on 6/7 with improvement in hematochezia and diarrhea.     Subjective:   BMs to continue to decrease-stools more formed, very small amount of blood   Assessment  & Plan :    Crohn's disease with exacerbation: Ongoing diarrhea with hematochezia for almost 1 month, resistant to anti-inflammatories and IV steroids. Repeat colonoscopy on 6/6 shows significant inflammation in the entire colon, biopsy results consistent with Crohn's disease (ulcerative colitis). Continues to be on IV Solu-Medrol, GI began Remicade on 6/7. Symptoms areSlowly improving with only 2-3 bowel movements a day, blood in the stools have significantly improved and is now minimal. Hemoglobin is stable since yesterday while on IV heparin.  Acute  blood loss anemia: Secondary to persistent hematochezia due to above. Given total of 8 units PRBC during hospitliation. Currently Hb stable 8.8, continue to follow and transfuse as needed.  DVT right arm associated with PICC line: Initially anticoagulated with IV heparin, but discontinued due to worsening hematochezia requiring PRBC transfusion. PICC line has been removed, plans were to monitor hematochezia and once improved start anticoagulation. Repeat upper extremity Doppler on 6/9 shows DVT, since diarrhea and hematochezia have significantly improved-after discussion with GI, patient was restarted on IV heparin-overnight hemoglobin continues to  be stable-since diarrhea and hematochezia are rapidly improving-we will switch her to Eliquis. We will follow and monitor closely.   AKI: Resolved, likely mild prerenal azotemia in a setting of diarrhea. Follow electrolytes periodically.  Dyslipidemia: Continue statin  Hypothyroidism: Continue levothyroxine  Lower extremity edema: Suspect secondary to hypoalbuminemia, UA without proteinuria. Echo shows no heart failure with an EF of 60-65%. Improving with Lasix, weight down to 233 pounds (from a peak of 250 pounds).Continue diuresis with Lasix and follow weights.  Severe protein calorie malnutrition with anasarca: Continue supplements, previously on TNA- stopped (no PICC)  Tricuspid valve regurgitation: Found on Echocardiogram on 6/8 showing trivial regurgitation. Follow up as outpatient with PCP  Code Status :  Full  Family Communication  : Brother at bedside   Disposition Plan  : Home 6/11 or 6/12  Consults  :  GI  Procedures  :  Colonoscopy 6/6  DVT Prophylaxis  : Eliquis  Lab Results  Component Value Date   PLT 127* 07/24/2015    Inpatient Medications  Scheduled Meds: . sodium chloride   Intravenous Once  . ALPRAZolam  0.25 mg Oral Daily  . apixaban  10 mg Oral BID   Followed by  . [START ON 07/31/2015] apixaban  5 mg Oral BID    . cholecalciferol  5,000 Units Oral q morning - 10a  . diphenoxylate-atropine  1 tablet Oral TID AC & HS  . feeding supplement (PRO-STAT SUGAR FREE 64)  30 mL Oral TID  . folic acid  1 mg Oral Daily  . insulin aspart  0-20 Units Subcutaneous TID WC  . levothyroxine  100 mcg Oral QAC breakfast  . ondansetron  4 mg Intravenous BID  . pantoprazole  40 mg Oral Daily  . [START ON 08/01/2015] predniSONE  40 mg Oral QAC breakfast  . simvastatin  20 mg Oral q1800  . sodium chloride flush  10-40 mL Intracatheter Q12H   Continuous Infusions:   PRN Meds:.acetaminophen **OR** acetaminophen, albuterol, menthol-cetylpyridinium, morphine injection, ondansetron **OR** ondansetron (ZOFRAN) IV, oxyCODONE, sodium chloride flush, traZODone  Antibiotics  :    Anti-infectives    Start     Dose/Rate Route Frequency Ordered Stop   07/10/15 0330  metroNIDAZOLE (FLAGYL) IVPB 500 mg  Status:  Discontinued     500 mg 100 mL/hr over 60 Minutes Intravenous Every 8 hours 07/10/15 0241 07/14/15 0914   07/10/15 0300  ciprofloxacin (CIPRO) IVPB 400 mg  Status:  Discontinued     400 mg 200 mL/hr over 60 Minutes Intravenous 2 times daily 07/10/15 0241 07/14/15 0914         Objective:   Filed Vitals:   07/23/15 0747 07/23/15 1459 07/23/15 2116 07/24/15 0631  BP:  149/72 145/60 153/62  Pulse:  69 67 63  Temp:  97.8 F (36.6 C) 98.4 F (36.9 C) 97.9 F (36.6 C)  TempSrc:  Oral Oral Oral  Resp:  20 19 19   Height:      Weight: 110.6 kg (243 lb 13.3 oz)   105.7 kg (233 lb 0.4 oz)  SpO2:  98% 98% 99%    Wt Readings from Last 3 Encounters:  07/24/15 105.7 kg (233 lb 0.4 oz)  07/06/15 104.327 kg (230 lb)  06/29/15 104.327 kg (230 lb)     Intake/Output Summary (Last 24 hours) at 07/24/15 1241 Last data filed at 07/24/15 1104  Gross per 24 hour  Intake 1056.5 ml  Output   3325 ml  Net -2268.5 ml  Physical Exam  Awake Alert, Oriented X 3, No new F.N deficits, Normal affect [place].AT,PERRAL Supple  Neck,No JVD, No cervical lymphadenopathy appriciated.  Symmetrical Chest wall movement, Good air movement bilaterally, CTAB RRR,No Gallops,Rubs or new Murmurs, No Parasternal Heave +ve B.Sounds, Abd Soft, No tenderness, No organomegaly appriciated, No rebound - guarding or rigidity. + peripheral edema , No Cyanosis, Clubbing, No new Rash or bruise.    Data Review:    CBC  Recent Labs Lab 07/18/15 0619  07/20/15 2131 07/21/15 0836 07/22/15 0502 07/23/15 0506 07/24/15 0055  WBC 8.7  --   --  4.4 4.7 4.2 4.5  HGB 6.8*  < > 7.2* 7.8*  8.2* 6.3* 9.0* 8.8*  HCT 20.9*  < > 20.8* 23.4*  24.3* 18.9* 26.6* 25.8*  PLT 207  --   --  165 107* 124* 127*  MCV 93.3  --   --  91.8 90.0 88.1 86.9  MCH 30.4  --   --  30.6 30.0 29.8 29.6  MCHC 32.5  --   --  33.3 33.3 33.8 34.1  RDW 15.3  --   --  15.1 14.6 16.5* 15.9*  < > = values in this interval not displayed.  Chemistries   Recent Labs Lab 07/19/15 0615 07/21/15 0836 07/22/15 0502 07/23/15 0506 07/24/15 0055  NA 136 136 137 137 136  K 3.9 3.6 3.9 4.0 4.1  CL 107 107 106 105 102  CO2 24 23 26 28 29   GLUCOSE 120* 126* 131* 147* 161*  BUN 24* 21* 23* 30* 32*  CREATININE 1.13* 0.96 1.05* 1.10* 1.32*  CALCIUM 7.4* 7.5* 7.4* 7.6* 7.6*  AST  --  22  --   --   --   ALT  --  25  --   --   --   ALKPHOS  --  55  --   --   --   BILITOT  --  0.4  --   --   --    ------------------------------------------------------------------------------------------------------------------ No results for input(s): CHOL, HDL, LDLCALC, TRIG, CHOLHDL, LDLDIRECT in the last 72 hours.  No results found for: HGBA1C ------------------------------------------------------------------------------------------------------------------ No results for input(s): TSH, T4TOTAL, T3FREE, THYROIDAB in the last 72 hours.  Invalid input(s): FREET3 ------------------------------------------------------------------------------------------------------------------ No  results for input(s): VITAMINB12, FOLATE, FERRITIN, TIBC, IRON, RETICCTPCT in the last 72 hours.  Coagulation profile No results for input(s): INR, PROTIME in the last 168 hours.  No results for input(s): DDIMER in the last 72 hours.  Cardiac Enzymes No results for input(s): CKMB, TROPONINI, MYOGLOBIN in the last 168 hours.  Invalid input(s): CK ------------------------------------------------------------------------------------------------------------------ No results found for: BNP  Micro Results No results found for this or any previous visit (from the past 240 hour(s)).  Radiology Reports Ct Abdomen Pelvis Wo Contrast  07/10/2015  CLINICAL DATA:  Acute onset of GI bleeding.  Initial encounter. EXAM: CT ABDOMEN AND PELVIS WITHOUT CONTRAST TECHNIQUE: Multidetector CT imaging of the abdomen and pelvis was performed following the standard protocol without IV contrast. COMPARISON:  CT of the abdomen and pelvis from 06/25/2015 FINDINGS: Trace right-sided pleural fluid is noted, with associated atelectasis. The liver and spleen are unremarkable in appearance. The gallbladder is within normal limits. The pancreas and adrenal glands are unremarkable. Scattered bilateral parapelvic renal cysts are seen. Minimal nonspecific perinephric stranding is noted bilaterally. There is no evidence of hydronephrosis. No renal or ureteral stones are identified. The small bowel is unremarkable in appearance. The stomach is within normal limits. No acute vascular abnormalities are  seen. The patient is status post appendectomy. Vague soft tissue inflammation is noted along the ascending, descending and proximal sigmoid colon, concerning for acute infectious or inflammatory colitis. Underlying diverticulosis is noted along the transverse, descending and sigmoid colon. Trace fluid is seen tracking along the paracolic gutters. The bladder is mildly distended and grossly unremarkable. The patient is status post  hysterectomy. The ovaries are grossly symmetric. No suspicious adnexal masses are seen. No inguinal lymphadenopathy is seen. No acute osseous abnormalities are identified. IMPRESSION: 1. Vague soft tissue inflammation along the ascending, descending and proximal sigmoid colon, concerning for acute infectious or inflammatory colitis. Trace fluid tracking along the paracolic gutters. 2. Underlying diverticulosis along the transverse, descending and sigmoid colon. 3. Scattered bilateral parapelvic renal cysts again noted. 4. Trace right-sided pleural fluid, with associated atelectasis. Electronically Signed   By: Roanna Raider M.D.   On: 07/10/2015 00:00   Dg Chest 2 View  07/19/2015  CLINICAL DATA:  GI bleed, weakness, nonsmoker, long-term use immunosuppressant medication, possible infiltrates EXAM: CHEST  2 VIEW COMPARISON:  None. FINDINGS: Cardiomediastinal silhouette is unremarkable. Mild hyperinflation. No acute infiltrate or pleural effusion. No pulmonary edema. Mild thoracic spine osteopenia. Degenerative changes mid and lower thoracic spine. IMPRESSION: No active disease. Mild hyperinflation. Degenerative changes mid and lower thoracic spine. Electronically Signed   By: Natasha Mead M.D.   On: 07/19/2015 08:16   Ct Abdomen Pelvis W Contrast  06/25/2015  CLINICAL DATA:  Bright red diarrhea this morning. Lower abdominal pain. EXAM: CT ABDOMEN AND PELVIS WITH CONTRAST TECHNIQUE: Multidetector CT imaging of the abdomen and pelvis was performed using the standard protocol following bolus administration of intravenous contrast. CONTRAST:  ISOVUE-300 IOPAMIDOL (ISOVUE-300) INJECTION 61% COMPARISON:  10/18/2010 FINDINGS: Lower chest: Stable 5 mm nodule in the right middle lobe on sequence 4, image 10 appears to have a few calcifications and probably represents a benign granuloma. Stable punctate nodular density near the right minor fissure on sequence 4, image 4. Small amount atelectasis or scarring at the  right lung base. Hepatobiliary: Normal appearance of the liver, gallbladder and portal venous system. Pancreas: Fatty changes throughout the pancreas without evidence for inflammation or duct dilatation. Spleen: Normal appearance of spleen without enlargement Adrenals/Urinary Tract: Normal bilateral adrenal glands. There are bilateral peripelvic cysts without hydronephrosis. Normal urinary bladder. There is probably a punctate nonobstructive right kidney stone in lower pole. Stomach/Bowel: There is extensive colon wall thickening, predominantly involving the transverse colon and hepatic flexure. There is small amount of pericolonic edema near the hepatic flexure. There is extensive diverticulosis involving the descending colon and the sigmoid colon. Difficult to exclude mild inflammation in the descending colon. There is oral contrast in the small bowel and right colon. No evidence for a small bowel obstruction. Normal appearance of the stomach and duodenum. Evidence for vascular engorgement in the omentum. Vascular/Lymphatic: Normal caliber of the abdominal aorta without significant atherosclerotic disease. Few scattered periaortic lymph nodes have not significantly changed. There are prominent lymph nodes along the lymphatic drainage of the sigmoid colon on sequence 2, images 66 and 63. There are enlarged lymph nodes near the left iliac chain which could also be along the lymphatic drainage of the left colon. These lymph nodes are best seen on sequence 2, image 59 and 60. Largest node measures 0.9 cm in the short axis. Reproductive: Uterus has been removed. Evidence for bilateral ovarian tissue without gross abnormality. Other: Trace amount of free fluid in the pelvis.  No free air. Musculoskeletal:  Stable sclerotic density in the right ilium is suggestive for a bone island. No suspicious bone findings. IMPRESSION: Diffuse colonic wall thickening which is most prominent in the transverse colon and hepatic flexure.  Findings are suggestive for colitis. This could be an infectious or inflammatory process. There is a small amount of fluid in the pelvis. Extensive diverticulosis involving the sigmoid colon and left colon but this does not appear to be the source for the colonic inflammation. Prominent lymph nodes along the lymphatic distribution of the sigmoid colon could be reactive. These nodes have enlarged since 2012 and consider a 3-6 month follow-up for surveillance. Bilateral renal cysts without hydronephrosis. Electronically Signed   By: Richarda Overlie M.D.   On: 06/25/2015 10:41    Time Spent in minutes-25   [place]  on 07/24/2015 at 12:41 PM

## 2015-07-24 NOTE — Progress Notes (Signed)
ANTICOAGULATION CONSULT NOTE - Follow up Consult  Pharmacy Consult for Heparin IV --> Apixaban Indication: DVT  Allergies  Allergen Reactions  . Benadryl [Diphenhydramine Hcl] Other (See Comments)    Jittery  . Codeine Nausea And Vomiting    Pt is unable to recall all reactions to codeine  . Meloxicam Other (See Comments)    Due to stage III kidney disease  . Penicillins     Has patient had a PCN reaction causing immediate rash, facial/tongue/throat swelling, SOB or lightheadedness with hypotension: unknown Has patient had a PCN reaction causing severe rash involving mucus membranes or skin necrosis: unknown Has patient had a PCN reaction that required hospitalization: yes, drs visit Has patient had a PCN reaction occurring within the last 10 years: yes If all of the above answers are "NO", then may proceed with Cephalosporin use.   Marland Kitchen Propoxyphene Hcl Nausea And Vomiting  . Tramadol Nausea Only    jittery    Patient Measurements: Height:  (162.6 cm) Weight: 233 lb 0.4 oz (105.7 kg) IBW/kg (Calculated) : 54.7 Heparin Dosing Weight: 81.5 kg  Vital Signs: Temp: 97.9 F (36.6 C) (06/10 0631) Temp Source: Oral (06/10 0631) BP: 153/62 mmHg (06/10 0631) Pulse Rate: 63 (06/10 0631)  Labs:  Recent Labs  07/22/15 0502 07/23/15 0506 07/23/15 1709 07/24/15 0055  HGB 6.3* 9.0*  --  8.8*  HCT 18.9* 26.6*  --  25.8*  PLT 107* 124*  --  127*  HEPARINUNFRC  --   --  0.22* 0.34  CREATININE 1.05* 1.10*  --  1.32*    Estimated Creatinine Clearance: 48.4 mL/min (by C-G formula based on Cr of 1.32).   Medications:  . heparin 1,250 Units/hr (07/23/15 1826)    Assessment: 70 yo female with new diagnosis of suspected ulcerative colitis. She had a PICC placed for TPN 5/28-5/30. Right upper extremity doppler completed 6/1 which showed a partially occlusive DVT in the axillary vein felt related to PICC. Pharmacy is consulted to dose IV heparin. Due to worsening Hgb and blood  in stools on anticoag, heparin was stopped 6/4. Per MD, improved today after PRBC x 2 and would like to resume heparin on 6/9.  Significant events: 6/2: Small amts blood noted in BMs overnight 6/3: Bloody diarrhea persists, increased since anticoagulation started per GI notes 6/4: x4 bloody stools per RN; heparin stopped 6/7: received 1st dose of Remicade 6/9: Repeat doppler shows persistent RUE DVT.  Resume Heparin drip. 6/10 Transition from Heparin to apixaban.  Today, 07/24/2015:  Heparin level 0.38, remains therapeutic  CBC: Hgb 8.8 is slightly decreased, Plt 127 is stable.    1 BM reported on 6/9 - pt unable to say if there was blood present  Rn reports no bleeding or complications this morning.  SCr 1.3 is increased, CrCl ~ 48 ml/min  Goal of Therapy: Monitor platelets by anticoagulation protocol: Yes  Plan: Discontinue Heparin drip at the time of the first apixaban dose - discussed timing with RN Briana Apixaban  PO BID x7 days, then reduce to  PO BID  Monitor closely for signs of bleeding or thrombosis  Pharmacist to provide apixaban education and discount card prior to discharge.  Lynann Beaver PharmD, BCPS Pager [phone] 07/24/2015 8:27 AM

## 2015-07-24 NOTE — Progress Notes (Signed)
Pt continues to refuse CPAP qhs.  RT will monitor as needed.

## 2015-07-24 NOTE — Progress Notes (Signed)
ANTICOAGULATION CONSULT NOTE - Follow Up Consult  Pharmacy Consult for Heparin Indication: persistent RUE DVT  Allergies  Allergen Reactions  . Benadryl [Diphenhydramine Hcl] Other (See Comments)    Jittery  . Codeine Nausea And Vomiting    Pt is unable to recall all reactions to codeine  . Meloxicam Other (See Comments)    Due to stage III kidney disease  . Penicillins     Has patient had a PCN reaction causing immediate rash, facial/tongue/throat swelling, SOB or lightheadedness with hypotension: unknown Has patient had a PCN reaction causing severe rash involving mucus membranes or skin necrosis: unknown Has patient had a PCN reaction that required hospitalization: yes, drs visit Has patient had a PCN reaction occurring within the last 10 years: yes If all of the above answers are "NO", then may proceed with Cephalosporin use.   Marland Kitchen Propoxyphene Hcl Nausea And Vomiting  . Tramadol Nausea Only    jittery    Patient Measurements: Height:  (162.6 cm) Weight: 243 lb 13.3 oz (110.6 kg) IBW/kg (Calculated) : 54.7 Heparin Dosing Weight:   Vital Signs: Temp: 98.4 F (36.9 C) (06/09 2116) Temp Source: Oral (06/09 2116) BP: 145/60 mmHg (06/09 2116) Pulse Rate: 67 (06/09 2116)  Labs:  Recent Labs  07/22/15 0502 07/23/15 0506 07/23/15 1709 07/24/15 0055  HGB 6.3* 9.0*  --  8.8*  HCT 18.9* 26.6*  --  25.8*  PLT 107* 124*  --  127*  HEPARINUNFRC  --   --  0.22* 0.34  CREATININE 1.05* 1.10*  --  1.32*    Estimated Creatinine Clearance: 49.6 mL/min (by C-G formula based on Cr of 1.32).   Medications:  Infusions:  . heparin 1,250 Units/hr (07/23/15 1826)    Assessment: Patient with heparin level at goal.  No heparin issues noted.  Goal of Therapy:  Heparin level 0.3-0.7 units/ml Monitor platelets by anticoagulation protocol: Yes   Plan:  Continue heparin drip at current rate Recheck level at 0900  Darlina Guys, Jacquenette Shone Crowford 07/24/2015,4:23 AM

## 2015-07-25 ENCOUNTER — Inpatient Hospital Stay (HOSPITAL_COMMUNITY): Payer: Medicare Other

## 2015-07-25 LAB — CBC
HEMATOCRIT: 28.1 % — AB (ref 36.0–46.0)
HEMOGLOBIN: 9.3 g/dL — AB (ref 12.0–15.0)
MCH: 30.2 pg (ref 26.0–34.0)
MCHC: 33.1 g/dL (ref 30.0–36.0)
MCV: 91.2 fL (ref 78.0–100.0)
Platelets: 120 10*3/uL — ABNORMAL LOW (ref 150–400)
RBC: 3.08 MIL/uL — AB (ref 3.87–5.11)
RDW: 16.2 % — ABNORMAL HIGH (ref 11.5–15.5)
WBC: 4 10*3/uL (ref 4.0–10.5)

## 2015-07-25 LAB — BASIC METABOLIC PANEL
Anion gap: 5 (ref 5–15)
BUN: 30 mg/dL — AB (ref 6–20)
CHLORIDE: 102 mmol/L (ref 101–111)
CO2: 30 mmol/L (ref 22–32)
CREATININE: 1.16 mg/dL — AB (ref 0.44–1.00)
Calcium: 7.4 mg/dL — ABNORMAL LOW (ref 8.9–10.3)
GFR calc Af Amer: 55 mL/min — ABNORMAL LOW (ref >60)
GFR calc non Af Amer: 47 mL/min — ABNORMAL LOW (ref >60)
Glucose, Bld: 98 mg/dL (ref 65–99)
POTASSIUM: 3.8 mmol/L (ref 3.5–5.1)
Sodium: 137 mmol/L (ref 135–145)

## 2015-07-25 LAB — GLUCOSE, CAPILLARY
GLUCOSE-CAPILLARY: 126 mg/dL — AB (ref 65–99)
Glucose-Capillary: 90 mg/dL (ref 65–99)
Glucose-Capillary: 114 mg/dL — ABNORMAL HIGH (ref 65–99)
Glucose-Capillary: 121 mg/dL — ABNORMAL HIGH (ref 65–99)

## 2015-07-25 MED ORDER — DIATRIZOATE MEGLUMINE & SODIUM 66-10 % PO SOLN
30.0000 mL | Freq: Once | ORAL | Status: AC
  Administered 2015-07-25: 30 mL via ORAL
  Filled 2015-07-25: qty 30

## 2015-07-25 MED ORDER — FUROSEMIDE 40 MG PO TABS
40.0000 mg | ORAL_TABLET | Freq: Every day | ORAL | Status: DC
  Administered 2015-07-25 – 2015-07-27 (×3): 40 mg via ORAL
  Filled 2015-07-25 (×3): qty 1

## 2015-07-25 MED ORDER — POTASSIUM CHLORIDE CRYS ER 20 MEQ PO TBCR
20.0000 meq | EXTENDED_RELEASE_TABLET | Freq: Every day | ORAL | Status: DC
  Administered 2015-07-25 – 2015-07-28 (×4): 20 meq via ORAL
  Filled 2015-07-25 (×4): qty 1

## 2015-07-25 NOTE — Progress Notes (Signed)
PROGRESS NOTE                                                                                                                                                                                                             Patient Demographics:    Jamie Wood, is a 70 y.o. female, DOB - 1945/06/08, NWG:[id number]  Admit date - 07/09/2015   Admitting Physician Courage Mariea Clonts, MD  Outpatient Primary MD for the patient is Cala Bradford, MD  LOS - 16  Chief Complaint  Patient presents with  . Abdominal Pain  . Abnormal Lab       Brief Narrative  Jamie Wood is an 70 y.o. female with abdominal pain and diarrhea for about 1 month with a recent endoscopy and 07/03/2015 with biopsies revealing moderate inflammation in rectum and sigmoid areas.Treated empirically with anti-inflammatories, steroids without any response. Hospital course has been prolonged and complicated by persistent diarrhea with hematochezia and acute blood loss anemia requiring numerous PRBC transfusion. Repeat colonoscopy on 6/6 showed severe patchy inflammation in the entire colon, preliminary biopsy results consistent with Crohn's disease. Began Remicade on 6/7 with improvement in hematochezia and diarrhea.     Subjective:   Complains of left lower quadrant abdominal pain that started yesterday afternoon. She still has approximately 3-4 somewhat loose stools with hematochezia and around 24 hours.   Assessment  & Plan :   Crohn's disease with exacerbation: Ongoing diarrhea with hematochezia for almost 1 month, resistant to anti-inflammatories and IV steroids. Repeat colonoscopy on 6/6 shows significant inflammation in the entire colon, biopsy results consistent with Crohn's disease (ulcerative colitis). Continues to be on IV Solu-Medrol, GI began Remicade on 6/7.although improved, she still has 3-4 loose stools with hematochezia in a 24-hour period. She now  complains of worsening left lower quadrant abdominal pain that she since yesterday afternoon, will check a CT of the abdomen  Acute blood loss anemia: Secondary to persistent hematochezia due to above. Given total of 8 units PRBC during hospitliation. Currently Hb stable 9.3, continue to follow and transfuse as needed.  DVT right arm associated with PICC line: Initially anticoagulated with IV heparin, but discontinued due to worsening hematochezia requiring PRBC transfusion. PICC line has been removed, plans were to monitor hematochezia and once improved start anticoagulation. Repeat upper extremity Doppler on 6/9 shows DVT, since diarrhea  and hematochezia have significantly improved-after discussion with GI, patient was restarted on IV heparin-and subsequently transitioned to Eliquis. I have asked RN to hold Eliquis, till we get results of CT of the abdomen-in case she has a abscess or collection that needs draining then we will need to Eliquis to be placed on hold.  AKI: Resolved, likely mild prerenal azotemia in a setting of diarrhea. Follow electrolytes periodically.  Dyslipidemia: Continue statin  Hypothyroidism: Continue levothyroxine  Lower extremity edema: Suspect secondary to hypoalbuminemia with an PRBC resuscitation. UA without proteinuria. Echo shows no heart failure with an EF of 60-65%. Will start low-dose oral Lasix, and follow weights. Follow electrolytes closely  Severe protein calorie malnutrition with anasarca: Continue supplements, previously on TNA- stopped (no PICC)  Tricuspid valve regurgitation: Found on Echocardiogram on 6/8 showing trivial regurgitation. Follow up as outpatient with PCP  Code Status :  Full  Family Communication  : Brother at bedside   Disposition Plan: Await CT scan abdomen-suspect home sometime this week.  Consults  :  GI  Procedures  :  Colonoscopy 6/6  DVT Prophylaxis  : Eliquis  Lab Results  Component Value Date   PLT 120* 07/25/2015     Inpatient Medications  Scheduled Meds: . sodium chloride   Intravenous Once  . ALPRAZolam  0.25 mg Oral Daily  . apixaban  10 mg Oral BID   Followed by  . [START ON 07/31/2015] apixaban  5 mg Oral BID  . cholecalciferol  5,000 Units Oral q morning - 10a  . diphenoxylate-atropine  1 tablet Oral TID AC & HS  . feeding supplement (PRO-STAT SUGAR FREE 64)  30 mL Oral TID  . folic acid  1 mg Oral Daily  . furosemide  40 mg Oral Daily  . insulin aspart  0-20 Units Subcutaneous TID WC  . levothyroxine  100 mcg Oral QAC breakfast  . ondansetron  4 mg Intravenous BID  . pantoprazole  40 mg Oral Daily  . potassium chloride  20 mEq Oral Daily  . [START ON 08/01/2015] predniSONE  40 mg Oral QAC breakfast  . simvastatin  20 mg Oral q1800  . sodium chloride flush  10-40 mL Intracatheter Q12H   Continuous Infusions:   PRN Meds:.acetaminophen **OR** acetaminophen, albuterol, menthol-cetylpyridinium, morphine injection, ondansetron **OR** ondansetron (ZOFRAN) IV, oxyCODONE, sodium chloride flush, traZODone  Antibiotics  :    Anti-infectives    Start     Dose/Rate Route Frequency Ordered Stop   07/10/15 0330  metroNIDAZOLE (FLAGYL) IVPB 500 mg  Status:  Discontinued     500 mg 100 mL/hr over 60 Minutes Intravenous Every 8 hours 07/10/15 0241 07/14/15 0914   07/10/15 0300  ciprofloxacin (CIPRO) IVPB 400 mg  Status:  Discontinued     400 mg 200 mL/hr over 60 Minutes Intravenous 2 times daily 07/10/15 0241 07/14/15 0914         Objective:   Filed Vitals:   07/24/15 0631 07/24/15 1320 07/24/15 2232 07/25/15 0604  BP: 153/62 142/63 149/58 133/65  Pulse: 63 79 76 75  Temp: 97.9 F (36.6 C) 97.5 F (36.4 C) 98.4 F (36.9 C) 99.2 F (37.3 C)  TempSrc: Oral Oral Oral Oral  Resp: 19 20 19 18   Height:      Weight: 105.7 kg (233 lb 0.4 oz)   107.8 kg (237 lb 10.5 oz)  SpO2: 99% 99% 98% 97%    Wt Readings from Last 3 Encounters:  07/25/15 107.8 kg (237 lb 10.5 oz)  07/06/15  104.327  kg (230 lb)  06/29/15 104.327 kg (230 lb)     Intake/Output Summary (Last 24 hours) at 07/25/15 1012 Last data filed at 07/25/15 0500  Gross per 24 hour  Intake    370 ml  Output   2600 ml  Net  -2230 ml     Physical Exam  Awake Alert, Oriented X 3, No new F.N deficits, Normal affect [place].AT,PERRAL Supple Neck,No JVD, No cervical lymphadenopathy appriciated.  Symmetrical Chest wall movement, Good air movement bilaterally, CTAB RRR,No Gallops,Rubs or new Murmurs, No Parasternal Heave +ve B.Sounds, Abd Soft, mild to moderate tenderness in the left lower quadrant-no peritoneal signs + peripheral edema , No Cyanosis, Clubbing, No new Rash or bruise.    Data Review:    CBC  Recent Labs Lab 07/21/15 0836 07/22/15 0502 07/23/15 0506 07/24/15 0055 07/25/15 0734  WBC 4.4 4.7 4.2 4.5 4.0  HGB 7.8*  8.2* 6.3* 9.0* 8.8* 9.3*  HCT 23.4*  24.3* 18.9* 26.6* 25.8* 28.1*  PLT 165 107* 124* 127* 120*  MCV 91.8 90.0 88.1 86.9 91.2  MCH 30.6 30.0 29.8 29.6 30.2  MCHC 33.3 33.3 33.8 34.1 33.1  RDW 15.1 14.6 16.5* 15.9* 16.2*    Chemistries   Recent Labs Lab 07/21/15 0836 07/22/15 0502 07/23/15 0506 07/24/15 0055 07/25/15 0734  NA 136 137 137 136 137  K 3.6 3.9 4.0 4.1 3.8  CL 107 106 105 102 102  CO2 23 26 28 29 30   GLUCOSE 126* 131* 147* 161* 98  BUN 21* 23* 30* 32* 30*  CREATININE 0.96 1.05* 1.10* 1.32* 1.16*  CALCIUM 7.5* 7.4* 7.6* 7.6* 7.4*  AST 22  --   --   --   --   ALT 25  --   --   --   --   ALKPHOS 55  --   --   --   --   BILITOT 0.4  --   --   --   --    ------------------------------------------------------------------------------------------------------------------ No results for input(s): CHOL, HDL, LDLCALC, TRIG, CHOLHDL, LDLDIRECT in the last 72 hours.  No results found for: HGBA1C ------------------------------------------------------------------------------------------------------------------ No results for input(s): TSH, T4TOTAL, T3FREE,  THYROIDAB in the last 72 hours.  Invalid input(s): FREET3 ------------------------------------------------------------------------------------------------------------------ No results for input(s): VITAMINB12, FOLATE, FERRITIN, TIBC, IRON, RETICCTPCT in the last 72 hours.  Coagulation profile No results for input(s): INR, PROTIME in the last 168 hours.  No results for input(s): DDIMER in the last 72 hours.  Cardiac Enzymes No results for input(s): CKMB, TROPONINI, MYOGLOBIN in the last 168 hours.  Invalid input(s): CK ------------------------------------------------------------------------------------------------------------------ No results found for: BNP  Micro Results No results found for this or any previous visit (from the past 240 hour(s)).  Radiology Reports Ct Abdomen Pelvis Wo Contrast  07/10/2015  CLINICAL DATA:  Acute onset of GI bleeding.  Initial encounter. EXAM: CT ABDOMEN AND PELVIS WITHOUT CONTRAST TECHNIQUE: Multidetector CT imaging of the abdomen and pelvis was performed following the standard protocol without IV contrast. COMPARISON:  CT of the abdomen and pelvis from 06/25/2015 FINDINGS: Trace right-sided pleural fluid is noted, with associated atelectasis. The liver and spleen are unremarkable in appearance. The gallbladder is within normal limits. The pancreas and adrenal glands are unremarkable. Scattered bilateral parapelvic renal cysts are seen. Minimal nonspecific perinephric stranding is noted bilaterally. There is no evidence of hydronephrosis. No renal or ureteral stones are identified. The small bowel is unremarkable in appearance. The stomach is within normal limits. No acute vascular abnormalities are  seen. The patient is status post appendectomy. Vague soft tissue inflammation is noted along the ascending, descending and proximal sigmoid colon, concerning for acute infectious or inflammatory colitis. Underlying diverticulosis is noted along the transverse,  descending and sigmoid colon. Trace fluid is seen tracking along the paracolic gutters. The bladder is mildly distended and grossly unremarkable. The patient is status post hysterectomy. The ovaries are grossly symmetric. No suspicious adnexal masses are seen. No inguinal lymphadenopathy is seen. No acute osseous abnormalities are identified. IMPRESSION: 1. Vague soft tissue inflammation along the ascending, descending and proximal sigmoid colon, concerning for acute infectious or inflammatory colitis. Trace fluid tracking along the paracolic gutters. 2. Underlying diverticulosis along the transverse, descending and sigmoid colon. 3. Scattered bilateral parapelvic renal cysts again noted. 4. Trace right-sided pleural fluid, with associated atelectasis. Electronically Signed   By: Roanna Raider M.D.   On: 07/10/2015 00:00   Dg Chest 2 View  07/19/2015  CLINICAL DATA:  GI bleed, weakness, nonsmoker, long-term use immunosuppressant medication, possible infiltrates EXAM: CHEST  2 VIEW COMPARISON:  None. FINDINGS: Cardiomediastinal silhouette is unremarkable. Mild hyperinflation. No acute infiltrate or pleural effusion. No pulmonary edema. Mild thoracic spine osteopenia. Degenerative changes mid and lower thoracic spine. IMPRESSION: No active disease. Mild hyperinflation. Degenerative changes mid and lower thoracic spine. Electronically Signed   By: Natasha Mead M.D.   On: 07/19/2015 08:16   Ct Abdomen Pelvis W Contrast  06/25/2015  CLINICAL DATA:  Bright red diarrhea this morning. Lower abdominal pain. EXAM: CT ABDOMEN AND PELVIS WITH CONTRAST TECHNIQUE: Multidetector CT imaging of the abdomen and pelvis was performed using the standard protocol following bolus administration of intravenous contrast. CONTRAST:  ISOVUE-300 IOPAMIDOL (ISOVUE-300) INJECTION 61% COMPARISON:  10/18/2010 FINDINGS: Lower chest: Stable 5 mm nodule in the right middle lobe on sequence 4, image 10 appears to have a few calcifications  and probably represents a benign granuloma. Stable punctate nodular density near the right minor fissure on sequence 4, image 4. Small amount atelectasis or scarring at the right lung base. Hepatobiliary: Normal appearance of the liver, gallbladder and portal venous system. Pancreas: Fatty changes throughout the pancreas without evidence for inflammation or duct dilatation. Spleen: Normal appearance of spleen without enlargement Adrenals/Urinary Tract: Normal bilateral adrenal glands. There are bilateral peripelvic cysts without hydronephrosis. Normal urinary bladder. There is probably a punctate nonobstructive right kidney stone in lower pole. Stomach/Bowel: There is extensive colon wall thickening, predominantly involving the transverse colon and hepatic flexure. There is small amount of pericolonic edema near the hepatic flexure. There is extensive diverticulosis involving the descending colon and the sigmoid colon. Difficult to exclude mild inflammation in the descending colon. There is oral contrast in the small bowel and right colon. No evidence for a small bowel obstruction. Normal appearance of the stomach and duodenum. Evidence for vascular engorgement in the omentum. Vascular/Lymphatic: Normal caliber of the abdominal aorta without significant atherosclerotic disease. Few scattered periaortic lymph nodes have not significantly changed. There are prominent lymph nodes along the lymphatic drainage of the sigmoid colon on sequence 2, images 66 and 63. There are enlarged lymph nodes near the left iliac chain which could also be along the lymphatic drainage of the left colon. These lymph nodes are best seen on sequence 2, image 59 and 60. Largest node measures 0.9 cm in the short axis. Reproductive: Uterus has been removed. Evidence for bilateral ovarian tissue without gross abnormality. Other: Trace amount of free fluid in the pelvis.  No free air. Musculoskeletal:  Stable sclerotic density in the right ilium  is suggestive for a bone island. No suspicious bone findings. IMPRESSION: Diffuse colonic wall thickening which is most prominent in the transverse colon and hepatic flexure. Findings are suggestive for colitis. This could be an infectious or inflammatory process. There is a small amount of fluid in the pelvis. Extensive diverticulosis involving the sigmoid colon and left colon but this does not appear to be the source for the colonic inflammation. Prominent lymph nodes along the lymphatic distribution of the sigmoid colon could be reactive. These nodes have enlarged since 2012 and consider a 3-6 month follow-up for surveillance. Bilateral renal cysts without hydronephrosis. Electronically Signed   By: Richarda Overlie M.D.   On: 06/25/2015 10:41    Time Spent in minutes-25   [place]  on 07/25/2015 at 10:12 AM

## 2015-07-25 NOTE — Progress Notes (Signed)
Blood glucose taken   it was an error from the machine shown 10, but the real glucose taken @ 15:13 pm was read as 126 Rn, notify.

## 2015-07-25 NOTE — Discharge Instructions (Signed)
Information on my medicine - ELIQUIS (apixaban) This medication education was reviewed with me or my healthcare representative as part of my discharge preparation.  The pharmacist that spoke with me during my hospital stay was: Wynona Canes  Why was Eliquis prescribed for you? Eliquis was prescribed to treat blood clots that may have been found in the veins of your legs (deep vein thrombosis) or in your lungs (pulmonary embolism) and to reduce the risk of them occurring again.  What do You need to know about Eliquis ? The starting dose is 10 mg (two 5 mg tablets) taken TWICE daily for the FIRST SEVEN (7) DAYS, then on (enter date)  07/31/15  the dose is reduced to ONE 5 mg tablet taken TWICE daily.  Eliquis may be taken with or without food.   Try to take the dose about the same time in the morning and in the evening. If you have difficulty swallowing the tablet whole please discuss with your pharmacist how to take the medication safely.  Take Eliquis exactly as prescribed and DO NOT stop taking Eliquis without talking to the doctor who prescribed the medication.  Stopping may increase your risk of developing a new blood clot.  Refill your prescription before you run out.  After discharge, you should have regular check-up appointments with your healthcare provider that is prescribing your Eliquis.    What do you do if you miss a dose? If a dose of ELIQUIS is not taken at the scheduled time, take it as soon as possible on the same day and twice-daily administration should be resumed. The dose should not be doubled to make up for a missed dose.  Important Safety Information A possible side effect of Eliquis is bleeding. You should call your healthcare provider right away if you experience any of the following: ? Bleeding from an injury or your nose that does not stop. ? Unusual colored urine (red or dark brown) or unusual colored stools (red or black). ? Unusual bruising for unknown  reasons. ? A serious fall or if you hit your head (even if there is no bleeding).  Some medicines may interact with Eliquis and might increase your risk of bleeding or clotting while on Eliquis. To help avoid this, consult your healthcare provider or pharmacist prior to using any new prescription or non-prescription medications, including herbals, vitamins, non-steroidal anti-inflammatory drugs (NSAIDs) and supplements.  This website has more information on Eliquis (apixaban): http://www.eliquis.com/eliquis/home

## 2015-07-25 NOTE — Progress Notes (Signed)
Pt continues to refuse CPAP qhs.  RT will monitor and assess as needed. 

## 2015-07-25 NOTE — Progress Notes (Signed)
Pt continues to have loose, bloody stools. Will pass along to oncoming shift.

## 2015-07-26 LAB — COMPREHENSIVE METABOLIC PANEL
ALBUMIN: 1.6 g/dL — AB (ref 3.5–5.0)
ALT: 33 U/L (ref 14–54)
AST: 20 U/L (ref 15–41)
Alkaline Phosphatase: 64 U/L (ref 38–126)
Anion gap: 6 (ref 5–15)
BILIRUBIN TOTAL: 0.6 mg/dL (ref 0.3–1.2)
BUN: 26 mg/dL — AB (ref 6–20)
CHLORIDE: 102 mmol/L (ref 101–111)
CO2: 30 mmol/L (ref 22–32)
CREATININE: 1.07 mg/dL — AB (ref 0.44–1.00)
Calcium: 7.4 mg/dL — ABNORMAL LOW (ref 8.9–10.3)
GFR calc Af Amer: >60 mL/min (ref >60)
GFR calc non Af Amer: 52 mL/min — ABNORMAL LOW (ref >60)
GLUCOSE: 110 mg/dL — AB (ref 65–99)
POTASSIUM: 3.5 mmol/L (ref 3.5–5.1)
Sodium: 138 mmol/L (ref 135–145)
Total Protein: 4.3 g/dL — ABNORMAL LOW (ref 6.5–8.1)

## 2015-07-26 LAB — CBC
HEMATOCRIT: 26.3 % — AB (ref 36.0–46.0)
Hemoglobin: 8.7 g/dL — ABNORMAL LOW (ref 12.0–15.0)
MCH: 29.5 pg (ref 26.0–34.0)
MCHC: 33.1 g/dL (ref 30.0–36.0)
MCV: 89.2 fL (ref 78.0–100.0)
PLATELETS: 100 10*3/uL — AB (ref 150–400)
RBC: 2.95 MIL/uL — ABNORMAL LOW (ref 3.87–5.11)
RDW: 15.5 % (ref 11.5–15.5)
WBC: 3.6 10*3/uL — ABNORMAL LOW (ref 4.0–10.5)

## 2015-07-26 LAB — GLUCOSE, CAPILLARY
GLUCOSE-CAPILLARY: 93 mg/dL (ref 65–99)
GLUCOSE-CAPILLARY: 129 mg/dL — AB (ref 65–99)
Glucose-Capillary: 105 mg/dL — ABNORMAL HIGH (ref 65–99)
Glucose-Capillary: 149 mg/dL — ABNORMAL HIGH (ref 65–99)

## 2015-07-26 LAB — C-REACTIVE PROTEIN: CRP: 18.2 mg/dL — ABNORMAL HIGH (ref <1.0)

## 2015-07-26 MED ORDER — PRO-STAT SUGAR FREE PO LIQD
30.0000 mL | Freq: Two times a day (BID) | ORAL | Status: DC
  Administered 2015-07-26 – 2015-07-28 (×4): 30 mL via ORAL
  Filled 2015-07-26 (×5): qty 30

## 2015-07-26 NOTE — Progress Notes (Signed)
   07/26/15 0900  What Happened  Was fall witnessed? Yes  Who witnessed fall? Kristi nurse extern  Patients activity before fall bathroom-assisted  Point of contact hip/leg  Was patient injured? No  Follow Up  MD notified ghimire  Time MD notified [phone]  Family notified Yes-comment  Time family notified 0900  Additional tests No  Progress note created (see row info) Yes  Vitals  Temp 98.5 F (36.9 C)  Temp Source Oral  BP (!) 154/120 mmHg  BP Location Left Leg  BP Method Automatic  Patient Position (if appropriate) Lying  Pulse Rate (!) 125  Pulse Rate Source Dinamap  Resp 20  Oxygen Therapy  SpO2 99 %  O2 Device Room Air  Pain Assessment  Pain Assessment 0-10  Pain Score 2  Pain Location Knee  Pain Orientation Left  Pain Descriptors / Indicators Aching  Pain Intervention(s) Medication (See eMAR)

## 2015-07-26 NOTE — Progress Notes (Signed)
PROGRESS NOTE                                                                                                                                                                                                             Patient Demographics:    Jamie Wood, is a 70 y.o. female, DOB - [birth year], WUJ:[id number]  Admit date - 07/09/2015   Admitting Physician Jamie Mariea Clonts, MD  Outpatient Primary MD for the patient is Jamie Bradford, MD  LOS - 17  Chief Complaint  Patient presents with  . Abdominal Pain  . Abnormal Lab       Brief Narrative  Jamie Wood is an 70 y.o. female with abdominal pain and diarrhea for about 1 month with a recent endoscopy and 07/03/2015 with biopsies revealing moderate inflammation in rectum and sigmoid areas.Treated empirically with anti-inflammatories, steroids without any response. Hospital course has been prolonged and complicated by persistent diarrhea with hematochezia and acute blood loss anemia requiring numerous PRBC transfusion. Repeat colonoscopy on 6/6 showed severe patchy inflammation in the entire colon, preliminary biopsy results consistent with Crohn's disease. Began Remicade on 6/7 with improvement in hematochezia and diarrhea.     Subjective:   Left lower quadrant abdominal pain is much better, still has 2-3 loose stools a day with some minimal hematochezia. Sustained a mechanical fall while getting up from the commode and gently fell to the floor on her knees. Has some mild bilateral knee pain and erythema, but no other obvious injury. She is awake and alert. She subsequently ambulated in the hallway with physical therapy today    Assessment  & Plan :   Crohn's disease with exacerbation: Ongoing diarrhea with hematochezia for almost 1 month, resistant to anti-inflammatories and IV steroids. Repeat colonoscopy on 6/6 shows significant inflammation in the entire colon, biopsy results  consistent with Crohn's disease (ulcerative colitis). Continues to be on IV Solu-Medrol, GI began Remicade on 6/7.Slowly improving, however continues to have 2-3 loose stools a day with some mild hematochezia. Has some mild left lower quadrant abdominal pain, but CT abdomen on 6/11 without any abscess -still shows ongoing colitis.  Acute blood loss anemia: Secondary to persistent hematochezia due to above. Given total of 8 units PRBC during hospitliation. Currently Hb stable 8.7, continue to follow and transfuse as needed.  DVT right arm associated with  PICC line: Initially anticoagulated with IV heparin, but discontinued due to worsening hematochezia requiring PRBC transfusion. PICC line has been removed, plans were to monitor hematochezia and once improved start anticoagulation. Repeat upper extremity Doppler on 6/9 shows DVT, since diarrhea and hematochezia have significantly improved-after discussion with GI, patient was restarted on IV heparin-and subsequently transitioned to Eliquis. Hemoglobin continues to be stable  AKI: Resolved, likely mild prerenal azotemia in a setting of diarrhea. Follow electrolytes periodically.  Dyslipidemia: Continue statin  Hypothyroidism: Continue levothyroxine  Lower extremity edema: Suspect secondary to hypoalbuminemia with an PRBC resuscitation. UA without proteinuria. Echo shows no heart failure with an EF of 60-65%. Continue Lasix, weight and decreasing, continues to have some amount of edema which probably will get better with resolution of inflammation and with improvement of albumin levels.   Severe protein calorie malnutrition with anasarca: Continue supplements, previously on TNA- stopped (no PICC)  Tricuspid valve regurgitation: Found on Echocardiogram on 6/8 showing trivial regurgitation. Follow up as outpatient with PCP  Deconditioning: Has had a prolonged hospitalization, sustained a fall earlier this morning. Suspect would require SNF.  Code  Status :  Full  Family Communication  : Family member at bedside   Disposition Plan: Suspect will require SNF-home when bed available  Consults  :  GI  Procedures  :  Colonoscopy 6/6  DVT Prophylaxis  : Eliquis  Lab Results  Component Value Date   PLT 100* 07/26/2015    Inpatient Medications  Scheduled Meds: . sodium chloride   Intravenous Once  . ALPRAZolam  0.25 mg Oral Daily  . apixaban  10 mg Oral BID   Followed by  . [START ON 07/31/2015] apixaban  5 mg Oral BID  . cholecalciferol  5,000 Units Oral q morning - 10a  . diphenoxylate-atropine  1 tablet Oral TID AC & HS  . feeding supplement (PRO-STAT SUGAR FREE 64)  30 mL Oral BID BM  . folic acid  1 mg Oral Daily  . furosemide  40 mg Oral Daily  . insulin aspart  0-20 Units Subcutaneous TID WC  . levothyroxine  100 mcg Oral QAC breakfast  . ondansetron  4 mg Intravenous BID  . pantoprazole  40 mg Oral Daily  . potassium chloride  20 mEq Oral Daily  . [START ON 08/01/2015] predniSONE  40 mg Oral QAC breakfast  . simvastatin  20 mg Oral q1800  . sodium chloride flush  10-40 mL Intracatheter Q12H   Continuous Infusions:   PRN Meds:.acetaminophen **OR** acetaminophen, albuterol, menthol-cetylpyridinium, morphine injection, ondansetron **OR** ondansetron (ZOFRAN) IV, oxyCODONE, sodium chloride flush, traZODone  Antibiotics  :    Anti-infectives    Start     Dose/Rate Route Frequency Ordered Stop   07/10/15 0330  metroNIDAZOLE (FLAGYL) IVPB 500 mg  Status:  Discontinued     500 mg 100 mL/hr over 60 Minutes Intravenous Every 8 hours 07/10/15 0241 07/14/15 0914   07/10/15 0300  ciprofloxacin (CIPRO) IVPB 400 mg  Status:  Discontinued     400 mg 200 mL/hr over 60 Minutes Intravenous 2 times daily 07/10/15 0241 07/14/15 0914         Objective:   Filed Vitals:   07/26/15 0450 07/26/15 0650 07/26/15 0900 07/26/15 0949  BP: 117/53  154/120 127/63  Pulse: 78  125 92  Temp: 99 F (37.2 C)  98.5 F (36.9 C) 98.5 F  (36.9 C)  TempSrc: Oral  Oral Oral  Resp: 16  20 20   Height:  Weight:  102.2 kg (225 lb 5 oz)    SpO2: 96%  99% 93%    Wt Readings from Last 3 Encounters:  07/26/15 102.2 kg (225 lb 5 oz)  07/06/15 104.327 kg (230 lb)  06/29/15 104.327 kg (230 lb)     Intake/Output Summary (Last 24 hours) at 07/26/15 1120 Last data filed at 07/26/15 0900  Gross per 24 hour  Intake    360 ml  Output   4200 ml  Net  -3840 ml     Physical Exam  Awake Alert, Oriented X 3, No new F.N deficits, Normal affect [place].AT,PERRAL Supple Neck,No JVD, No cervical lymphadenopathy appriciated.  Symmetrical Chest wall movement, Good air movement bilaterally, CTAB RRR,No Gallops,Rubs or new Murmurs, No Parasternal Heave +ve B.Sounds, Abd Soft, mild to moderate tenderness in the left lower quadrant-no peritoneal signs + peripheral edema , No Cyanosis, Clubbing, No new Rash or bruise.    Data Review:    CBC  Recent Labs Lab 07/22/15 0502 07/23/15 0506 07/24/15 0055 07/25/15 0734 07/26/15 0515  WBC 4.7 4.2 4.5 4.0 3.6*  HGB 6.3* 9.0* 8.8* 9.3* 8.7*  HCT 18.9* 26.6* 25.8* 28.1* 26.3*  PLT 107* 124* 127* 120* 100*  MCV 90.0 88.1 86.9 91.2 89.2  MCH 30.0 29.8 29.6 30.2 29.5  MCHC 33.3 33.8 34.1 33.1 33.1  RDW 14.6 16.5* 15.9* 16.2* 15.5    Chemistries   Recent Labs Lab 07/21/15 0836 07/22/15 0502 07/23/15 0506 07/24/15 0055 07/25/15 0734 07/26/15 0515  NA 136 137 137 136 137 138  K 3.6 3.9 4.0 4.1 3.8 3.5  CL 107 106 105 102 102 102  CO2 23 26 28 29 30 30   GLUCOSE 126* 131* 147* 161* 98 110*  BUN 21* 23* 30* 32* 30* 26*  CREATININE 0.96 1.05* 1.10* 1.32* 1.16* 1.07*  CALCIUM 7.5* 7.4* 7.6* 7.6* 7.4* 7.4*  AST 22  --   --   --   --  20  ALT 25  --   --   --   --  33  ALKPHOS 55  --   --   --   --  64  BILITOT 0.4  --   --   --   --  0.6   ------------------------------------------------------------------------------------------------------------------ No results for input(s):  CHOL, HDL, LDLCALC, TRIG, CHOLHDL, LDLDIRECT in the last 72 hours.  No results found for: HGBA1C ------------------------------------------------------------------------------------------------------------------ No results for input(s): TSH, T4TOTAL, T3FREE, THYROIDAB in the last 72 hours.  Invalid input(s): FREET3 ------------------------------------------------------------------------------------------------------------------ No results for input(s): VITAMINB12, FOLATE, FERRITIN, TIBC, IRON, RETICCTPCT in the last 72 hours.  Coagulation profile No results for input(s): INR, PROTIME in the last 168 hours.  No results for input(s): DDIMER in the last 72 hours.  Cardiac Enzymes No results for input(s): CKMB, TROPONINI, MYOGLOBIN in the last 168 hours.  Invalid input(s): CK ------------------------------------------------------------------------------------------------------------------ No results found for: BNP  Micro Results No results found for this or any previous visit (from the past 240 hour(s)).  Radiology Reports Ct Abdomen Pelvis Wo Contrast  07/25/2015  CLINICAL DATA:  Left lower quadrant, diarrhea, and hematochezia. History of Crohn's disease. EXAM: CT ABDOMEN AND PELVIS WITHOUT CONTRAST TECHNIQUE: Multidetector CT imaging of the abdomen and pelvis was performed following the standard protocol without IV contrast. COMPARISON:  CT scans from Jul 09, 2015 and October 18, 2010 FINDINGS: The 5 mm nodule in the right middle lobe is stable since 2012. The lung bases are otherwise normal. No free air. A small amount of free  fluid is seen in the pelvis. Wall thickening and pericolonic stranding is again seen throughout most of the colon, involving the ascending, transverse, descending, and much of the sigmoid colon. The rectum is spared. The stomach and small bowel are normal. The liver, gallbladder, spleen, adrenal glands, and pancreas demonstrate no acute abnormalities. There is fatty  deposition the pancreas which is stable. No aneurysm or adenopathy. Parapelvic cysts are seen in the kidneys. There are tiny and punctate stones in the kidneys with no evidence of obstruction. No ureterectasis or ureteral stones. The pelvis demonstrates no adenopathy or mass. Patient is status post hysterectomy. The bladder is decompressed. The visualized bones are stable. IMPRESSION: 1. Colitis from the ascending colon to the sigmoid colon, consistent with the patient's history of Crohn's disease. No significant change in the interval. Electronically Signed   By: Gerome Sam III M.D   On: 07/25/2015 11:52   Ct Abdomen Pelvis Wo Contrast  07/10/2015  CLINICAL DATA:  Acute onset of GI bleeding.  Initial encounter. EXAM: CT ABDOMEN AND PELVIS WITHOUT CONTRAST TECHNIQUE: Multidetector CT imaging of the abdomen and pelvis was performed following the standard protocol without IV contrast. COMPARISON:  CT of the abdomen and pelvis from 06/25/2015 FINDINGS: Trace right-sided pleural fluid is noted, with associated atelectasis. The liver and spleen are unremarkable in appearance. The gallbladder is within normal limits. The pancreas and adrenal glands are unremarkable. Scattered bilateral parapelvic renal cysts are seen. Minimal nonspecific perinephric stranding is noted bilaterally. There is no evidence of hydronephrosis. No renal or ureteral stones are identified. The small bowel is unremarkable in appearance. The stomach is within normal limits. No acute vascular abnormalities are seen. The patient is status post appendectomy. Vague soft tissue inflammation is noted along the ascending, descending and proximal sigmoid colon, concerning for acute infectious or inflammatory colitis. Underlying diverticulosis is noted along the transverse, descending and sigmoid colon. Trace fluid is seen tracking along the paracolic gutters. The bladder is mildly distended and grossly unremarkable. The patient is status post  hysterectomy. The ovaries are grossly symmetric. No suspicious adnexal masses are seen. No inguinal lymphadenopathy is seen. No acute osseous abnormalities are identified. IMPRESSION: 1. Vague soft tissue inflammation along the ascending, descending and proximal sigmoid colon, concerning for acute infectious or inflammatory colitis. Trace fluid tracking along the paracolic gutters. 2. Underlying diverticulosis along the transverse, descending and sigmoid colon. 3. Scattered bilateral parapelvic renal cysts again noted. 4. Trace right-sided pleural fluid, with associated atelectasis. Electronically Signed   By: Roanna Raider M.D.   On: 07/10/2015 00:00   Dg Chest 2 View  07/19/2015  CLINICAL DATA:  GI bleed, weakness, nonsmoker, long-term use immunosuppressant medication, possible infiltrates EXAM: CHEST  2 VIEW COMPARISON:  None. FINDINGS: Cardiomediastinal silhouette is unremarkable. Mild hyperinflation. No acute infiltrate or pleural effusion. No pulmonary edema. Mild thoracic spine osteopenia. Degenerative changes mid and lower thoracic spine. IMPRESSION: No active disease. Mild hyperinflation. Degenerative changes mid and lower thoracic spine. Electronically Signed   By: Natasha Mead M.D.   On: 07/19/2015 08:16    Time Spent in minutes-25   [place]  on 07/26/2015 at 11:20 AM

## 2015-07-26 NOTE — Progress Notes (Signed)
Nutrition Follow-up  DOCUMENTATION CODES:   Morbid obesity  INTERVENTION:  - Continue Prostat but will decrease to BID. - RD will continue to monitor for additional needs.  NUTRITION DIAGNOSIS:   Inadequate oral intake related to poor appetite as evidenced by per patient/family report. -resolved.  No new nutrition dx at this time. -NEW  GOAL:   Patient will meet greater than or equal to 90% of their needs -met at this time.   MONITOR:   PO intake, Supplement acceptance, Weight trends, Labs, Skin, I & O's  ASSESSMENT:   70 y.o. female past medical history no GI discomfort with a recent endoscopy and 07/03/2015 that was congestive of possible inflammatory colitis however biopsies are not revealed inflammation, patient was treated empirically with mesalamine steroids, and previous GI pathogen was unremarkable the comes into the ED complaining of abdominal pain and bloody diarrhea for a month which has progressively gotten worse now she feels dizzy and lightheadedness, and ED was found to have a drop in the hemoglobin mild increase in her creatinine.  6/12 Pt has been eating 85-100% of meals for the past 3 days. Will continue Prostat to aid in meeting protein needs but will decrease from TID to BID.   Pt meeting needs on average at this time with no other interventions currently warranted. Estimated kcal needs updated this visit based on medical course, medical hx, and age. Medications reviewed; 40 mg oral Lasix/day, 20 mEq KCl/day. Labs reviewed; CBGs: 90-126 mg/dL, BUN/creatinine elevated but trending down, Ca: 7.4 mg/dL, GFR: 52.     6/5 - Her TPN was stopped 5/29, since then, PO has improved - Currently tolerating clear liquids and consumed ~50% of breakfast this AM.  - Documented PO intake averaging 67% over last 8 meals. - No nausea/vomiting at this time. - Wt is up 10kg since admission, no new wts since 5/29 -> exhibiting anasarca. - Per MD note, patient continues to have  bloody/watery bowel movements.  - She was to d/c home but RUE DVT was found.   Diet Order:  DIET SOFT Room service appropriate?: Yes; Fluid consistency:: Thin  Skin:  Reviewed, no issues  Last BM:  6/11  Height:   Ht Readings from Last 1 Encounters:  07/21/15 5' 4" (1.626 m)    Weight:   Wt Readings from Last 1 Encounters:  07/26/15 225 lb 5 oz (102.2 kg)    Ideal Body Weight:  54.5 kg  BMI:  Body mass index is 38.66 kg/(m^2).  Estimated Nutritional Needs:   Kcal:  1550-1800  Protein:  80-90 grams  Fluid:  1.8-2 L/day  EDUCATION NEEDS:   No education needs identified at this time     Jarome Matin, MS, RD, LDN Inpatient Clinical Dietitian Pager # [phone] After hours/weekend pager # [phone]

## 2015-07-26 NOTE — Progress Notes (Signed)
Physical Therapy Treatment Patient Details Name: Jamie Wood MRN: [id number] DOB: [birth year] Today's Date: 07/26/2015    History of Present Illness Jamie Wood is an 70 y.o. female past medical history no GI discomfort with a recent endoscopy and 07/03/2015 that was suggestive  of possible inflammatory colitis however biopsies are not revealed inflammation, patient was treated empirically steroids, and previous GI pathogen was unremarkable the comes into the ED complaining of abdominal pain and bloody diarrhea for a month which has progressively gotten worse now she feels dizzy and lightheaded    PT Comments    Due to extended length of stay and medical complexity.  Pt's mobility progress has also been slow.  Original LPT eval indicates "no PT follow up" .  Pt lives alone and now wishes to pursue [facility].  Pt does show a need due to recent fall and gait instability with limited endurance capacity.  Will consult LPT for updated D/C plans to SNF.  Follow Up Recommendations  SNF     Equipment Recommendations       Recommendations for Other Services       Precautions / Restrictions Precautions Precautions: Fall Precaution Comments: strict I & O Restrictions Weight Bearing Restrictions: No    Mobility  Bed Mobility Overal bed mobility: Needs Assistance Bed Mobility: Supine to Sit;Sit to Supine     Supine to sit: Min guard Sit to supine: Min assist   General bed mobility comments: increased assist back to bed with B LE   Transfers Overall transfer level: Needs assistance Equipment used: None Transfers: Sit to/from Stand Sit to Stand: Min guard         General transfer comment: increased time and VC's on safety with turns and use of hands to staedy self esp with stand to sit  Ambulation/Gait Ambulation/Gait assistance: Min guard Ambulation Distance (Feet): 85 Feet Assistive device: Rolling walker (2 wheeled) Gait Pattern/deviations: Step-through  pattern;Decreased stride length;Trunk flexed Gait velocity: decreased   General Gait Details: slightly unsteady at times. intermittent close guarding. Pt fatigues fairly easily.  Required need to use RW this session.  Trial amb without and noted increased gait instability and lateral sway with decreased self correction time   Stairs            Wheelchair Mobility    Modified Rankin (Stroke Patients Only)       Balance                                    Cognition Arousal/Alertness: Awake/alert Behavior During Therapy: WFL for tasks assessed/performed Overall Cognitive Status: Within Functional Limits for tasks assessed                      Exercises      General Comments        Pertinent Vitals/Pain Pain Assessment: Faces Pain Score: 5  Pain Location: L knee (recent fall in bathroom) Pain Descriptors / Indicators: Grimacing Pain Intervention(s): Monitored during session;Repositioned;Ice applied    Home Living                      Prior Function            PT Goals (current goals can now be found in the care plan section) Progress towards PT goals: Progressing toward goals    Frequency  Min 3X/week    PT  Plan Discharge plan needs to be updated    Co-evaluation             End of Session Equipment Utilized During Treatment: Gait belt Activity Tolerance: Patient tolerated treatment well Patient left: in bed     Time: 1115-5208 PT Time Calculation (min) (ACUTE ONLY): 15 min  Charges:  $Gait Training: 8-22 mins                    G Codes:      Felecia Shelling  PTA [facility]  Acute  Rehab Pager      [phone]   Reviewed and agree with Lucretia Field, PT Pager: [phone] 07/26/2015

## 2015-07-26 NOTE — Progress Notes (Signed)
.    Progress Note   Subjective  Jamie Wood is found laying in her bed this morning with her friend by her bedside. She tells me that she did have a fall this morning when her legs fell asleep while she was having a sponge bath and she tried to get up and walk. She denied hitting her head. She denies any continued pain from this fall. She explains that she has had a slowing of her bowel movements, noting only about 3 loose stools per day with a very minimal amount of bleeding. The patient is still concerned about being discharged as she "lives alone", but is aware that she has had improvement of her symptoms and that this needs to happen at some point.  Per the hospitalist patient will likely be discharged today. She does have scheduled appointments for her next Remicade infusions. It is recommended that she follow-up with our clinic within a week for recheck of CBC.   Objective   Vital signs in last 24 hours: Temp:  [98.5 F (36.9 C)-99.7 F (37.6 C)] 98.5 F (36.9 C) (06/12 0949) Pulse Rate:  [78-92] 92 (06/12 0949) Resp:  [16-20] 20 (06/12 0949) BP: (117-128)/(53-67) 127/63 mmHg (06/12 0949) SpO2:  [93 %-98 %] 93 % (06/12 0949) Weight:  [225 lb 5 oz (102.2 kg)] 225 lb 5 oz (102.2 kg) (06/12 0650) Last BM Date: 07/25/15 General: Caucasian female in NAD Heart:  Regular rate and rhythm; no murmurs Lungs: Respirations even and unlabored, lungs CTA bilaterally Abdomen:  Soft, mild generalized ttp and nondistended. Normal bowel sounds. Extremities:  Without edema. Neurologic:  Alert and oriented,  grossly normal neurologically. Psych:  Cooperative. Normal mood and affect.  Intake/Output from previous day: 06/11 0701 - 06/12 0700 In: 360 [P.O.:360] Out: 4300 [Urine:3475; Stool:825] Intake/Output this shift: Total I/O In: 240 [P.O.:240] Out: 225 [Urine:150; Stool:75]  Lab Results:  Recent Labs  07/24/15 0055 07/25/15 0734 07/26/15 0515  WBC 4.5 4.0 3.6*  HGB 8.8* 9.3* 8.7*    HCT 25.8* 28.1* 26.3*  PLT 127* 120* 100*   BMET  Recent Labs  07/24/15 0055 07/25/15 0734 07/26/15 0515  NA 136 137 138  K 4.1 3.8 3.5  CL 102 102 102  CO2 GLUCOSE 161* 98 110*  BUN 32* 30* 26*  CREATININE 1.32* 1.16* 1.07*  CALCIUM 7.6* 7.4* 7.4*   LFT  Recent Labs  07/26/15 0515  PROT 4.3*  ALBUMIN 1.6*  AST 20  ALT 33  ALKPHOS 64  BILITOT 0.6   PT/INR No results for input(s): LABPROT, INR in the last 72 hours.  Studies/Results: Ct Abdomen Pelvis Wo Contrast  07/25/2015  CLINICAL DATA:  Left lower quadrant, diarrhea, and hematochezia. History of Crohn's disease. EXAM: CT ABDOMEN AND PELVIS WITHOUT CONTRAST TECHNIQUE: Multidetector CT imaging of the abdomen and pelvis was performed following the standard protocol without IV contrast. COMPARISON:  CT scans from Jul 09, 2015 and October 18, 2010 FINDINGS: The 5 mm nodule in the right middle lobe is stable since 2012. The lung bases are otherwise normal. No free air. A small amount of free fluid is seen in the pelvis. Wall thickening and pericolonic stranding is again seen throughout most of the colon, involving the ascending, transverse, descending, and much of the sigmoid colon. The rectum is spared. The stomach and small bowel are normal. The liver, gallbladder, spleen, adrenal glands, and pancreas demonstrate no acute abnormalities. There is fatty deposition the pancreas which is stable. No  aneurysm or adenopathy. Parapelvic cysts are seen in the kidneys. There are tiny and punctate stones in the kidneys with no evidence of obstruction. No ureterectasis or ureteral stones. The pelvis demonstrates no adenopathy or mass. Patient is status post hysterectomy. The bladder is decompressed. The visualized bones are stable. IMPRESSION: 1. Colitis from the ascending colon to the sigmoid colon, consistent with the patient's history of Crohn's disease. No significant change in the interval. Electronically Signed   By: Gerome Sam III M.D   On: 07/25/2015 11:52       Assessment / Plan:   Assessment: 1. Colitis, Crohn's by repeat biopsies from 6/6: Patient now improving, would recommend continuing prednisone 40 mg as outpatient for a total of 7 days with taper to decrease by 5 mg per week after that. Patient did receive first Remicade infusion on 6/8 and is set up for her next 2 induction doses. She is aware of when they are scheduled. She continues to improve clinically 2. Acute blood loss anemia: Over the weekend patient's hemoglobin remains stable 3. RUE DVT: Patient now on Eliquis, day 3. Doing well.  Plan: 1. Continue Lomotil outpatient 2. Continue steroids Pred 40mg  x7 days, then decrease by 5 mg per week for 8 weeks 3. Pt should follow in our office for rpt CBC in one week on 08/02/15 anytime between 7:30-5:00pm on the bottom floor lab 4. Pt has follow up appt scheduled with Dr. Adela Lank on 08/12/15 at 10:15 am at [facility] office-checkin at 10:00 5. Will discuss above with Dr. Adela Lank, please await any further recs  Thank you for your kind consultation, we will sign off.  Principal Problem:   Crohn's colitis (HCC) Active Problems:   Anxiety state   GERD   Diverticulosis of large intestine   AKI (acute kidney injury) (HCC)   Anemia due to GI blood loss   Diarrhea in adult patient   Hypovolemia dehydration   Absolute anemia   Acute lower GI bleeding   Dehydration     LOS: 17 days   Unk Lightning  07/26/2015, 10:37 AM

## 2015-07-27 DIAGNOSIS — R5381 Other malaise: Secondary | ICD-10-CM

## 2015-07-27 LAB — CBC WITH DIFFERENTIAL/PLATELET
Basophils Absolute: 0 10*3/uL (ref 0.0–0.1)
Basophils Relative: 0 %
EOS PCT: 4 %
Eosinophils Absolute: 0.2 10*3/uL (ref 0.0–0.7)
HEMATOCRIT: 24.7 % — AB (ref 36.0–46.0)
HEMOGLOBIN: 8 g/dL — AB (ref 12.0–15.0)
LYMPHS ABS: 0.8 10*3/uL (ref 0.7–4.0)
LYMPHS PCT: 21 %
MCH: 29.1 pg (ref 26.0–34.0)
MCHC: 32.4 g/dL (ref 30.0–36.0)
MCV: 89.8 fL (ref 78.0–100.0)
Monocytes Absolute: 0.3 10*3/uL (ref 0.1–1.0)
Monocytes Relative: 8 %
NEUTROS ABS: 2.7 10*3/uL (ref 1.7–7.7)
Neutrophils Relative %: 67 %
Platelets: 109 10*3/uL — ABNORMAL LOW (ref 150–400)
RBC: 2.75 MIL/uL — AB (ref 3.87–5.11)
RDW: 15.4 % (ref 11.5–15.5)
WBC: 4 10*3/uL (ref 4.0–10.5)

## 2015-07-27 LAB — GLUCOSE, CAPILLARY
GLUCOSE-CAPILLARY: 134 mg/dL — AB (ref 65–99)
GLUCOSE-CAPILLARY: 188 mg/dL — AB (ref 65–99)
Glucose-Capillary: 117 mg/dL — ABNORMAL HIGH (ref 65–99)
Glucose-Capillary: 125 mg/dL — ABNORMAL HIGH (ref 65–99)

## 2015-07-27 LAB — C-REACTIVE PROTEIN: CRP: 18.8 mg/dL — ABNORMAL HIGH (ref <1.0)

## 2015-07-27 MED ORDER — FUROSEMIDE 20 MG PO TABS
20.0000 mg | ORAL_TABLET | Freq: Every day | ORAL | Status: DC
  Administered 2015-07-28: 20 mg via ORAL
  Filled 2015-07-27: qty 1

## 2015-07-27 MED ORDER — PREDNISONE 20 MG PO TABS
40.0000 mg | ORAL_TABLET | Freq: Every day | ORAL | Status: DC
  Administered 2015-07-27 – 2015-07-28 (×2): 40 mg via ORAL
  Filled 2015-07-27 (×3): qty 2

## 2015-07-27 NOTE — Clinical Social Work Note (Signed)
Clinical Social Work Assessment  Patient Details  Name: Jamie Wood MRN: [id number] Date of Birth: 1945/07/17  Date of referral:  07/27/15               Reason for consult:  Facility Placement                Permission sought to share information with:  Case Manager Permission granted to share information::  Yes, Verbal Permission Granted  Name::     SNF  Agency::     Relationship::     Contact Information:     Housing/Transportation Living arrangements for the past 2 months:  Single Family Home Source of Information:  Patient, Friend/Neighbor, Other (Comment Required) (Sister in Normal) Patient Interpreter Needed:  None Criminal Activity/Legal Involvement Pertinent to Current Situation/Hospitalization:  No - Comment as needed Significant Relationships:  Friend, Other Family Members, Siblings Lives with:  Self Do you feel safe going back to the place where you live?  No Need for family participation in patient care:  Yes (Comment)  Care giving concerns:  Patient expressed her plan was to go home after leaving the hospital but after falling on her way to the bathroom patient feels she needs to go to a SNF to get stronger. Patient states she feels weak in the legs and does not feels she can manage at home. Patient expressed she was just diagnosed with Crohns's disease and will have follow-up appointments.     Social Worker assessment / plan: Patient sister in law and best friend where in the room during assessment. LCSWA explained role and reason for consult. Patient is agreeable to go a SNF at this time to get stronger in legs with physical therapy. Patient expressed her friends and family are very supportive of her care at this time.   Employment status:  Retired Health and safety inspector:  [employer] PT Recommendations:  Skilled Nursing Facility Information / Referral to community resources:  Skilled Nursing Facility  Patient/Family's Response to care:  Agreeable.   Patient/Family's  Understanding of and Emotional Response to Diagnosis, Current Treatment, and Prognosis: Patient is hopeful at this time to attend SNF for physical therapy to get stronger and live her life before the hospitalization. Patient family and friends are well versed in her care and are great supports at this time.   Emotional Assessment Appearance:  Appears stated age Attitude/Demeanor/Rapport:   (Cooperative) Affect (typically observed):  Pleasant, Hopeful, Accepting Orientation:  Oriented to Self, Oriented to Place, Oriented to  Time, Oriented to Situation Alcohol / Substance use:  Not Applicable Psych involvement (Current and /or in the community):  No (Comment)  Discharge Needs  Concerns to be addressed:  No discharge needs identified Readmission within the last 30 days:  No Current discharge risk:  None Barriers to Discharge:  No Barriers Identified   Clearance Coots, LCSW 07/27/2015, 9:54 AM

## 2015-07-27 NOTE — Progress Notes (Signed)
Progress Note   Subjective  Jamie Wood is found laying in bed this morning with her family by her bedside. She tells me that she was "quite startled last night at 1030 pm when the Dr. came in and woke me from my sleep and told me I needed surgery". Her family is also concerned regarding this as this has not been discussed before. Patient describes that she has not had a bowel movement since yesterday and only had 3 loose stools yesterday with a minimal amount of bleeding. She does continue with abdominal pain which is generalized and worse before a bowel movement. I did spend time to answer the questions and dis way the family's concerns. She denied any new symptoms today.   Objective   Vital signs in last 24 hours: Temp:  [98.4 F (36.9 C)-99.7 F (37.6 C)] 99.7 F (37.6 C) (06/13 0536) Pulse Rate:  [86-95] 95 (06/13 0536) Resp:  [20] 20 (06/13 0536) BP: (112-137)/(53-70) 112/65 mmHg (06/13 0536) SpO2:  [96 %-98 %] 96 % (06/13 0536) Weight:  [233 lb 0.4 oz (105.7 kg)] 233 lb 0.4 oz (105.7 kg) (06/13 0536) Last BM Date: 07/26/15 General: Caucasian female in NAD Heart: Regular rate and rhythm; no murmurs Lungs: Respirations even and unlabored, lungs CTA bilaterally Abdomen: Soft, mod generalized ttp and nondistended. Normal bowel sounds. Extremities: Without edema. Neurologic: Alert and oriented, grossly normal neurologically. Psych: Cooperative. Normal mood and affect.  Intake/Output from previous day: 06/12 0701 - 06/13 0700 In: 480 [P.O.:480] Out: 1050 [Urine:900; Stool:150]  Lab Results:  Recent Labs  07/25/15 0734 07/26/15 0515 07/27/15 0523  WBC 4.0 3.6* 4.0  HGB 9.3* 8.7* 8.0*  HCT 28.1* 26.3* 24.7*  PLT 120* 100* 109*   BMET  Recent Labs  07/25/15 0734 07/26/15 0515  NA 137 138  K 3.8 3.5  CL 102 102  CO2 30 30  GLUCOSE 98 110*  BUN 30* 26*  CREATININE 1.16* 1.07*  CALCIUM 7.4* 7.4*   LFT  Recent Labs  07/26/15 0515  PROT 4.3*  ALBUMIN  1.6*  AST 20  ALT 33  ALKPHOS 64  BILITOT 0.6   PT/INR No results for input(s): LABPROT, INR in the last 72 hours.  Studies/Results: Ct Abdomen Pelvis Wo Contrast  07/25/2015  CLINICAL DATA:  Left lower quadrant, diarrhea, and hematochezia. History of Crohn's disease. EXAM: CT ABDOMEN AND PELVIS WITHOUT CONTRAST TECHNIQUE: Multidetector CT imaging of the abdomen and pelvis was performed following the standard protocol without IV contrast. COMPARISON:  CT scans from Jul 09, 2015 and October 18, 2010 FINDINGS: The 5 mm nodule in the right middle lobe is stable since 2012. The lung bases are otherwise normal. No free air. A small amount of free fluid is seen in the pelvis. Wall thickening and pericolonic stranding is again seen throughout most of the colon, involving the ascending, transverse, descending, and much of the sigmoid colon. The rectum is spared. The stomach and small bowel are normal. The liver, gallbladder, spleen, adrenal glands, and pancreas demonstrate no acute abnormalities. There is fatty deposition the pancreas which is stable. No aneurysm or adenopathy. Parapelvic cysts are seen in the kidneys. There are tiny and punctate stones in the kidneys with no evidence of obstruction. No ureterectasis or ureteral stones. The pelvis demonstrates no adenopathy or mass. Patient is status post hysterectomy. The bladder is decompressed. The visualized bones are stable. IMPRESSION: 1. Colitis from the ascending colon to the sigmoid colon, consistent with the patient's history of Crohn's  disease. No significant change in the interval. Electronically Signed   By: Gerome Sam III M.D   On: 07/25/2015 11:52       Assessment / Plan:   Assessment: 1. Severe Colitis, Crohn's by repeat biopsies from 6/6: Patient improving, would recommend continuing prednisone 40 mg as outpatient for a total of 7 days with taper to decrease by 5 mg per week after that. Patient did receive first Remicade infusion on  6/8 and is set up for her next 2 induction doses. She is aware of when they are scheduled. She continues to improve clinically. CRP has risen most recently, today only slight rise from 18.2 to 18.8 from yesterday. 2. Acute blood loss anemia: Hgb continually decreasing over past 48 hours, 8.7 down to 8.0 this morning 3. RUE DVT: Patient now on Eliquis, day 4. Doing well.  Plan: 1. Continue Lomotil outpatient 2. Continue steroids Pred 40mg  x7 days, then decrease by 5 mg per week for 8 weeks 3. Pt should follow in our office for rpt CBC in one week on 08/02/15 anytime between 7:30-5:00pm on the bottom floor lab 4. Pt has follow up appt scheduled with Dr. Adela Lank on 08/12/15 at 10:15 am at [facility] office-checkin at 10:00 5. Did spend a long time in discussion of "the plan" with the family members and patient today. I answered all their questions and also may Dr. Adela Lank aware of their concerns. He will follow within this afternoon.  Thank you for your kind consultation, we will likely sign off, but please await Dr. Lanetta Inch recommendations.  Principal Problem:   Crohn's colitis (HCC) Active Problems:   Anxiety state   GERD   Diverticulosis of large intestine   AKI (acute kidney injury) (HCC)   Anemia due to GI blood loss   Diarrhea in adult patient   Hypovolemia dehydration   Absolute anemia   Acute lower GI bleeding   Dehydration     LOS: 18 days   Unk Lightning  07/27/2015, 10:23 AM

## 2015-07-27 NOTE — Care Management Important Message (Signed)
Important Message  Patient Details  Name: PRUE LINGENFELTER MRN: [id number] Date of Birth: May 27, 1945   Medicare Important Message Given:  Yes    Audreyana, [place] 07/27/2015, 9:38 AMImportant Message  Patient Details  Name: TASHARI SCHOENFELDER MRN: [id number] Date of Birth: Jun 22, 1945   Medicare Important Message Given:  Yes    Elva, Mauro 07/27/2015, 9:37 AM

## 2015-07-27 NOTE — Progress Notes (Signed)
LCSWA met with patient and family at bedside. Patient has chosen SNF- [facility]. [place] educated patient and explained a representative from the facility would meet with her. Patient agreeable.   Kathrin Greathouse, Latanya Presser, MSW Clinical Social Worker 5E and Psychiatric Service Line [phone] 07/27/2015  12:05 PM

## 2015-07-27 NOTE — NC FL2 (Signed)
[place] MEDICAID FL2 LEVEL OF CARE SCREENING TOOL     IDENTIFICATION  Patient Name: Jamie Wood Birthdate: 08-22-1945 Sex: female Admission Date (Current Location): 07/09/2015  [facility] and [place] Number:  Producer, television/film/video and Address:  [facility],  [address] [place] [address]      Provider Number: [id number]  Attending Physician Name and Address:  Maretta Bees, MD  Relative Name and Phone Number:       Current Level of Care: Hospital Recommended Level of Care: Skilled Nursing Facility Prior Approval Number:    Date Approved/Denied:   PASRR Number:    Discharge Plan: SNF    Current Diagnoses: Patient Active Problem List   Diagnosis Date Noted  . Dehydration   . Acute lower GI bleeding   . Absolute anemia   . AKI (acute kidney injury) (HCC) 07/09/2015  . Anemia due to GI blood loss 07/09/2015  . Diarrhea in adult patient 07/09/2015  . Hypovolemia dehydration 07/09/2015  . Crohn's colitis (HCC) 07/09/2015  . UNSPECIFIED HYPOTHYROIDISM 06/08/2009  . VITAMIN D DEFICIENCY 06/08/2009  . HYPERLIPIDEMIA 06/08/2009  . Anxiety state 06/08/2009  . GERD 06/08/2009  . Diverticulosis of large intestine 06/08/2009  . DIVERTICULITIS, COLON 06/08/2009  . OSTEOARTHRITIS 06/08/2009  . COLONIC POLYPS, HYPERPLASTIC, HX OF 06/08/2009    Orientation RESPIRATION BLADDER Height & Weight     Self, Time, Situation, Place  Normal Continent Weight: 233 lb 0.4 oz (105.7 kg) Height:   (162.6 cm)  BEHAVIORAL SYMPTOMS/MOOD NEUROLOGICAL BOWEL NUTRITION STATUS      Continent Diet Soft   AMBULATORY STATUS COMMUNICATION OF NEEDS Skin   Limited Assist Verbally Normal                       Personal Care Assistance Level of Assistance              Functional Limitations Info  Sight, Hearing, Speech Sight Info: Adequate Hearing Info: Adequate Speech Info: Adequate    SPECIAL CARE FACTORS FREQUENCY  PT (By licensed PT)     PT Frequency:  5              Contractures Contractures Info: Not present    Additional Factors Info  Code Status, Allergies, Psychotropic, Isolation Precautions, Insulin Sliding Scale Code Status Info: FullCode Allergies Info: Benadryl, Codeine, Meloxicam, Penicillins, Propoxyphene Hcl, Tramadol Psychotropic Info: Xanax Insulin Sliding Scale Info: insulin aspart (novoLOG) injection 0-20 Units Isolation Precautions Info: None     Current Medications (07/27/2015):  This is the current hospital active medication list Current Facility-Administered Medications  Medication Dose Route Frequency Provider Last Rate Last Dose  . 0.9 %  sodium chloride infusion   Intravenous Once Leanne Chang, NP      . acetaminophen (TYLENOL) tablet 650 mg  650 mg Oral Q6H PRN Shon Hale, MD   650 mg at 07/24/15 1631   Or  . acetaminophen (TYLENOL) suppository 650 mg  650 mg Rectal Q6H PRN Courage Emokpae, MD      . albuterol (PROVENTIL) (2.5 MG/3ML) 0.083% nebulizer solution 2.5 mg  2.5 mg Nebulization Q2H PRN Shon Hale, MD      . ALPRAZolam Prudy Feeler) tablet 0.25 mg  0.25 mg Oral Daily Courage Emokpae, MD   0.25 mg at 07/26/15 1014  . apixaban (ELIQUIS) tablet 10 mg  10 mg Oral BID Winfield Rast, RPH   10 mg at 07/26/15 2215   Followed by  . [START  ON 07/31/2015] apixaban (ELIQUIS) tablet 5 mg  5 mg Oral BID Winfield Rast, RPH      . cholecalciferol (VITAMIN D) tablet 5,000 Units  5,000 Units Oral q morning - 10a Shon Hale, MD   5,000 Units at 07/26/15 1012  . diphenoxylate-atropine (LOMOTIL) 2.5-0.025 MG per tablet 1 tablet  1 tablet Oral TID AC & HS Iva Boop, MD   1 tablet at 07/27/15 0825  . feeding supplement (PRO-STAT SUGAR FREE 64) liquid 30 mL  30 mL Oral BID BM Maretta Bees, MD   30 mL at 07/26/15 1445  . folic acid (FOLVITE) tablet 1 mg  1 mg Oral Daily Courage Emokpae, MD   1 mg at 07/26/15 1013  . furosemide (LASIX) tablet 40 mg  40 mg Oral Daily Maretta Bees, MD   40  mg at 07/26/15 1014  . insulin aspart (novoLOG) injection 0-20 Units  0-20 Units Subcutaneous TID WC Calvert Cantor, MD   3 Units at 07/26/15 1231  . levothyroxine (SYNTHROID, LEVOTHROID) tablet 100 mcg  100 mcg Oral QAC breakfast Shon Hale, MD   100 mcg at 07/27/15 0825  . menthol-cetylpyridinium (CEPACOL) lozenge 3 mg  1 lozenge Oral PRN Marinda Elk, MD      . morphine 2 MG/ML injection 2 mg  2 mg Intravenous Q3H PRN Shon Hale, MD   2 mg at 07/26/15 2215  . ondansetron (ZOFRAN) tablet 4 mg  4 mg Oral Q6H PRN Shon Hale, MD       Or  . ondansetron (ZOFRAN) injection 4 mg  4 mg Intravenous Q6H PRN Shon Hale, MD      . ondansetron (ZOFRAN) injection 4 mg  4 mg Intravenous BID Rachael Fee, MD   4 mg at 07/26/15 2215  . oxyCODONE (Oxy IR/ROXICODONE) immediate release tablet 5 mg  5 mg Oral Q4H PRN Shon Hale, MD   5 mg at 07/18/15 0525  . pantoprazole (PROTONIX) EC tablet 40 mg  40 mg Oral Daily Shon Hale, MD   40 mg at 07/26/15 1012  . potassium chloride SA (K-DUR,KLOR-CON) CR tablet 20 mEq  20 mEq Oral Daily Maretta Bees, MD   20 mEq at 07/26/15 1013  . [START ON 08/01/2015] predniSONE (DELTASONE) tablet 40 mg  40 mg Oral QAC breakfast Iva Boop, MD      . simvastatin (ZOCOR) tablet 20 mg  20 mg Oral q1800 Shon Hale, MD   20 mg at 07/26/15 1711  . sodium chloride flush (NS) 0.9 % injection 10-40 mL  10-40 mL Intracatheter Q12H Marinda Elk, MD   10 mL at 07/26/15 2224  . sodium chloride flush (NS) 0.9 % injection 10-40 mL  10-40 mL Intracatheter PRN Marinda Elk, MD   10 mL at 07/21/15 1031  . traZODone (DESYREL) tablet 50 mg  50 mg Oral QHS PRN Shon Hale, MD   50 mg at 07/14/15 2204     Discharge Medications: Please see discharge summary for a list of discharge medications.  Relevant Imaging Results:  Relevant Lab Results:   Additional Information SS#[id number]  Clearance Coots, LCSW

## 2015-07-27 NOTE — Progress Notes (Signed)
PROGRESS NOTE                                                                                                                                                                                                             Patient Demographics:    Jamie Wood, is a 70 y.o. female, DOB - [birth year], ZOX:[id number]  Admit date - 07/09/2015   Admitting Physician Courage Mariea Clonts, MD  Outpatient Primary MD for the patient is Cala Bradford, MD  LOS - 18  Chief Complaint  Patient presents with  . Abdominal Pain  . Abnormal Lab       Brief Narrative  Jamie Wood is an 70 y.o. female with abdominal pain and diarrhea for about 1 month with a recent endoscopy and 07/03/2015 with biopsies revealing moderate inflammation in rectum and sigmoid areas.Treated empirically with anti-inflammatories, steroids without any response. Hospital course has been prolonged and complicated by persistent diarrhea with hematochezia and acute blood loss anemia requiring numerous PRBC transfusion. Hospital course has also been complicated by development of right upper extremity DVT related to PICC line insertion. Repeat colonoscopy on 6/6 showed severe patchy inflammation in the entire colon, preliminary biopsy results consistent with Crohn's disease. Began Remicade on 6/7 with improvement in hematochezia and diarrhea.     Subjective:   Her diarrhea has slowly continued to improve-she still gets 2-3 loose stools in a 24-hour period. She still has some amount of hematochezia with the stools. Overall she is much better than last week. Continues to have mild left lower quadrant abdominal pain.   Assessment  & Plan :   Crohn's disease with exacerbation: Ongoing diarrhea with hematochezia for almost 1 month, resistant to anti-inflammatories and IV steroids. Repeat colonoscopy on 6/6 shows significant inflammation in the entire colon, biopsy results consistent with  Crohn's disease (ulcerative colitis). Transitioned from IV Solu-Medrol to prednisone, GI began Remicade on 6/7.Slowly improving, however continues to have 2-3 loose stools a day with some hematochezia. Has some mild left lower quadrant abdominal pain, but CT abdomen on 6/11 without any abscess -still shows ongoing colitis. Awaiting GI input today.  Acute blood loss anemia: Secondary to persistent hematochezia due to above. Given total of 8 units PRBC during hospitliation. Hemoglobin was stable for the past few days, again today it has decreased to 8.0, amount of hematochezia/diarrhea still appears  to be limited to 2-3 episodes per day. For now would continue Eliquis and monitor for to monitor for 1 additional day before discharge, if Hb continues to decrease, then may need to contemplate discontinuing anticoagulation.  DVT right arm associated with PICC line: Initially anticoagulated with IV heparin, but discontinued due to worsening hematochezia requiring PRBC transfusion. PICC line has been removed, plans were to monitor hematochezia and once improved start anticoagulation. Repeat upper extremity Doppler on 6/9 shows DVT, since diarrhea and hematochezia have significantly improved-after discussion with GI, patient was restarted on IV heparin-and subsequently transitioned to Eliquis. Hemoglobin downtrending to 8.0 today, continue anticoagulation cautiously today has clinically amount of hematochezia has been stable for the past few days, if hemoglobin continues to down trend, then may need to stop anticoagulation. Repeat CBC in a.m.   AKI: Improving. Likely mild prerenal azotemia in a setting of diarrhea. Follow electrolytes periodically.  Dyslipidemia: Continue statin  Hypothyroidism: Continue levothyroxine  Lower extremity edema/volume overload: Suspect secondary to hypoalbuminemia with an PRBC resuscitation. UA without proteinuria. Echo shows no heart failure with an EF of 60-65%. Continue Lasix,  weight decreasing, continues to have some amount of edema which probably will get better with resolution of inflammation and with improvement of albumin levels.   Severe protein calorie malnutrition with anasarca: Continue supplements, previously on TNA- stopped (no PICC)  Tricuspid valve regurgitation: Found on Echocardiogram on 6/8 showing trivial regurgitation. Follow up as outpatient with PCP  Deconditioning: Has had a prolonged hospitalization, sustained a fall on 6/12. Suspect would require SNF.  Code Status :  Full  Family Communication  : Brother at bedside   Disposition Plan: SNF likely 6/14 if hemoglobin stable  Consults  :  GI  Procedures  :  Colonoscopy 6/6  DVT Prophylaxis  : Eliquis  Lab Results  Component Value Date   PLT 109* 07/27/2015    Inpatient Medications  Scheduled Meds: . sodium chloride   Intravenous Once  . ALPRAZolam  0.25 mg Oral Daily  . apixaban  10 mg Oral BID   Followed by  . [START ON 07/31/2015] apixaban  5 mg Oral BID  . cholecalciferol  5,000 Units Oral q morning - 10a  . diphenoxylate-atropine  1 tablet Oral TID AC & HS  . feeding supplement (PRO-STAT SUGAR FREE 64)  30 mL Oral BID BM  . folic acid  1 mg Oral Daily  . furosemide  40 mg Oral Daily  . insulin aspart  0-20 Units Subcutaneous TID WC  . levothyroxine  100 mcg Oral QAC breakfast  . ondansetron  4 mg Intravenous BID  . pantoprazole  40 mg Oral Daily  . potassium chloride  20 mEq Oral Daily  . [START ON 08/01/2015] predniSONE  40 mg Oral QAC breakfast  . simvastatin  20 mg Oral q1800  . sodium chloride flush  10-40 mL Intracatheter Q12H   Continuous Infusions:   PRN Meds:.acetaminophen **OR** acetaminophen, albuterol, menthol-cetylpyridinium, morphine injection, ondansetron **OR** ondansetron (ZOFRAN) IV, oxyCODONE, sodium chloride flush, traZODone  Antibiotics  :    Anti-infectives    Start     Dose/Rate Route Frequency Ordered Stop   07/10/15 0330  metroNIDAZOLE  (FLAGYL) IVPB 500 mg  Status:  Discontinued     500 mg 100 mL/hr over 60 Minutes Intravenous Every 8 hours 07/10/15 0241 07/14/15 0914   07/10/15 0300  ciprofloxacin (CIPRO) IVPB 400 mg  Status:  Discontinued     400 mg 200 mL/hr over 60 Minutes Intravenous 2  times daily 07/10/15 0241 07/14/15 0914         Objective:   Filed Vitals:   07/26/15 0949 07/26/15 1428 07/26/15 2140 07/27/15 0536  BP: 127/63 137/70 113/53 112/65  Pulse: 92 86 89 95  Temp: 98.5 F (36.9 C) 98.4 F (36.9 C) 99.2 F (37.3 C) 99.7 F (37.6 C)  TempSrc: Oral Oral Oral Oral  Resp: 20 20 20 20   Height:      Weight:    105.7 kg (233 lb 0.4 oz)  SpO2: 93% 98% 97% 96%    Wt Readings from Last 3 Encounters:  07/27/15 105.7 kg (233 lb 0.4 oz)  07/06/15 104.327 kg (230 lb)  06/29/15 104.327 kg (230 lb)     Intake/Output Summary (Last 24 hours) at 07/27/15 1248 Last data filed at 07/26/15 1431  Gross per 24 hour  Intake    240 ml  Output    825 ml  Net   -585 ml     Physical Exam  Awake Alert, Oriented X 3, No new F.N deficits, Normal affect [place].AT,PERRAL Supple Neck,No JVD, No cervical lymphadenopathy appriciated.  Symmetrical Chest wall movement, Good air movement bilaterally, CTAB RRR,No Gallops,Rubs or new Murmurs, No Parasternal Heave +ve B.Sounds, Abd Soft, mild to moderate tenderness in the left lower quadrant-no peritoneal signs + peripheral edema , No Cyanosis, Clubbing, No new Rash or bruise.    Data Review:    CBC  Recent Labs Lab 07/23/15 0506 07/24/15 0055 07/25/15 0734 07/26/15 0515 07/27/15 0523  WBC 4.2 4.5 4.0 3.6* 4.0  HGB 9.0* 8.8* 9.3* 8.7* 8.0*  HCT 26.6* 25.8* 28.1* 26.3* 24.7*  PLT 124* 127* 120* 100* 109*  MCV 88.1 86.9 91.2 89.2 89.8  MCH 29.8 29.6 30.2 29.5 29.1  MCHC 33.8 34.1 33.1 33.1 32.4  RDW 16.5* 15.9* 16.2* 15.5 15.4  LYMPHSABS  --   --   --   --  0.8  MONOABS  --   --   --   --  0.3  EOSABS  --   --   --   --  0.2  BASOSABS  --   --   --   --   0.0    Chemistries   Recent Labs Lab 07/21/15 0836 07/22/15 0502 07/23/15 0506 07/24/15 0055 07/25/15 0734 07/26/15 0515  NA 136 137 137 136 137 138  K 3.6 3.9 4.0 4.1 3.8 3.5  CL 107 106 105 102 102 102  CO2 23 26 28 29 30 30   GLUCOSE 126* 131* 147* 161* 98 110*  BUN 21* 23* 30* 32* 30* 26*  CREATININE 0.96 1.05* 1.10* 1.32* 1.16* 1.07*  CALCIUM 7.5* 7.4* 7.6* 7.6* 7.4* 7.4*  AST 22  --   --   --   --  20  ALT 25  --   --   --   --  33  ALKPHOS 55  --   --   --   --  64  BILITOT 0.4  --   --   --   --  0.6   ------------------------------------------------------------------------------------------------------------------ No results for input(s): CHOL, HDL, LDLCALC, TRIG, CHOLHDL, LDLDIRECT in the last 72 hours.  No results found for: HGBA1C ------------------------------------------------------------------------------------------------------------------ No results for input(s): TSH, T4TOTAL, T3FREE, THYROIDAB in the last 72 hours.  Invalid input(s): FREET3 ------------------------------------------------------------------------------------------------------------------ No results for input(s): VITAMINB12, FOLATE, FERRITIN, TIBC, IRON, RETICCTPCT in the last 72 hours.  Coagulation profile No results for input(s): INR, PROTIME in the last 168 hours.  No  results for input(s): DDIMER in the last 72 hours.  Cardiac Enzymes No results for input(s): CKMB, TROPONINI, MYOGLOBIN in the last 168 hours.  Invalid input(s): CK ------------------------------------------------------------------------------------------------------------------ No results found for: BNP  Micro Results No results found for this or any previous visit (from the past 240 hour(s)).  Radiology Reports Ct Abdomen Pelvis Wo Contrast  07/25/2015  CLINICAL DATA:  Left lower quadrant, diarrhea, and hematochezia. History of Crohn's disease. EXAM: CT ABDOMEN AND PELVIS WITHOUT CONTRAST TECHNIQUE: Multidetector  CT imaging of the abdomen and pelvis was performed following the standard protocol without IV contrast. COMPARISON:  CT scans from Jul 09, 2015 and October 18, 2010 FINDINGS: The 5 mm nodule in the right middle lobe is stable since 2012. The lung bases are otherwise normal. No free air. A small amount of free fluid is seen in the pelvis. Wall thickening and pericolonic stranding is again seen throughout most of the colon, involving the ascending, transverse, descending, and much of the sigmoid colon. The rectum is spared. The stomach and small bowel are normal. The liver, gallbladder, spleen, adrenal glands, and pancreas demonstrate no acute abnormalities. There is fatty deposition the pancreas which is stable. No aneurysm or adenopathy. Parapelvic cysts are seen in the kidneys. There are tiny and punctate stones in the kidneys with no evidence of obstruction. No ureterectasis or ureteral stones. The pelvis demonstrates no adenopathy or mass. Patient is status post hysterectomy. The bladder is decompressed. The visualized bones are stable. IMPRESSION: 1. Colitis from the ascending colon to the sigmoid colon, consistent with the patient's history of Crohn's disease. No significant change in the interval. Electronically Signed   By: Gerome Sam III M.D   On: 07/25/2015 11:52   Ct Abdomen Pelvis Wo Contrast  07/10/2015  CLINICAL DATA:  Acute onset of GI bleeding.  Initial encounter. EXAM: CT ABDOMEN AND PELVIS WITHOUT CONTRAST TECHNIQUE: Multidetector CT imaging of the abdomen and pelvis was performed following the standard protocol without IV contrast. COMPARISON:  CT of the abdomen and pelvis from 06/25/2015 FINDINGS: Trace right-sided pleural fluid is noted, with associated atelectasis. The liver and spleen are unremarkable in appearance. The gallbladder is within normal limits. The pancreas and adrenal glands are unremarkable. Scattered bilateral parapelvic renal cysts are seen. Minimal nonspecific  perinephric stranding is noted bilaterally. There is no evidence of hydronephrosis. No renal or ureteral stones are identified. The small bowel is unremarkable in appearance. The stomach is within normal limits. No acute vascular abnormalities are seen. The patient is status post appendectomy. Vague soft tissue inflammation is noted along the ascending, descending and proximal sigmoid colon, concerning for acute infectious or inflammatory colitis. Underlying diverticulosis is noted along the transverse, descending and sigmoid colon. Trace fluid is seen tracking along the paracolic gutters. The bladder is mildly distended and grossly unremarkable. The patient is status post hysterectomy. The ovaries are grossly symmetric. No suspicious adnexal masses are seen. No inguinal lymphadenopathy is seen. No acute osseous abnormalities are identified. IMPRESSION: 1. Vague soft tissue inflammation along the ascending, descending and proximal sigmoid colon, concerning for acute infectious or inflammatory colitis. Trace fluid tracking along the paracolic gutters. 2. Underlying diverticulosis along the transverse, descending and sigmoid colon. 3. Scattered bilateral parapelvic renal cysts again noted. 4. Trace right-sided pleural fluid, with associated atelectasis. Electronically Signed   By: Roanna Raider M.D.   On: 07/10/2015 00:00   Dg Chest 2 View  07/19/2015  CLINICAL DATA:  GI bleed, weakness, nonsmoker, long-term use immunosuppressant medication, possible  infiltrates EXAM: CHEST  2 VIEW COMPARISON:  None. FINDINGS: Cardiomediastinal silhouette is unremarkable. Mild hyperinflation. No acute infiltrate or pleural effusion. No pulmonary edema. Mild thoracic spine osteopenia. Degenerative changes mid and lower thoracic spine. IMPRESSION: No active disease. Mild hyperinflation. Degenerative changes mid and lower thoracic spine. Electronically Signed   By: Natasha Mead M.D.   On: 07/19/2015 08:16    Time Spent in  minutes-25   [place]  on 07/27/2015 at 12:48 PM

## 2015-07-28 DIAGNOSIS — E871 Hypo-osmolality and hyponatremia: Secondary | ICD-10-CM

## 2015-07-28 DIAGNOSIS — E559 Vitamin D deficiency, unspecified: Secondary | ICD-10-CM

## 2015-07-28 DIAGNOSIS — E039 Hypothyroidism, unspecified: Secondary | ICD-10-CM

## 2015-07-28 DIAGNOSIS — E785 Hyperlipidemia, unspecified: Secondary | ICD-10-CM

## 2015-07-28 DIAGNOSIS — K219 Gastro-esophageal reflux disease without esophagitis: Secondary | ICD-10-CM

## 2015-07-28 DIAGNOSIS — N183 Chronic kidney disease, stage 3 unspecified: Secondary | ICD-10-CM

## 2015-07-28 DIAGNOSIS — F411 Generalized anxiety disorder: Secondary | ICD-10-CM

## 2015-07-28 DIAGNOSIS — K5730 Diverticulosis of large intestine without perforation or abscess without bleeding: Secondary | ICD-10-CM

## 2015-07-28 DIAGNOSIS — K50111 Crohn's disease of large intestine with rectal bleeding: Secondary | ICD-10-CM

## 2015-07-28 LAB — BASIC METABOLIC PANEL
Anion gap: 6 (ref 5–15)
BUN: 22 mg/dL — ABNORMAL HIGH (ref 6–20)
CHLORIDE: 99 mmol/L — AB (ref 101–111)
CO2: 31 mmol/L (ref 22–32)
CREATININE: 1.18 mg/dL — AB (ref 0.44–1.00)
Calcium: 7.6 mg/dL — ABNORMAL LOW (ref 8.9–10.3)
GFR calc non Af Amer: 46 mL/min — ABNORMAL LOW (ref >60)
GFR, EST AFRICAN AMERICAN: 54 mL/min — AB (ref >60)
Glucose, Bld: 158 mg/dL — ABNORMAL HIGH (ref 65–99)
Potassium: 4 mmol/L (ref 3.5–5.1)
Sodium: 136 mmol/L (ref 135–145)

## 2015-07-28 LAB — CBC
HEMATOCRIT: 26.2 % — AB (ref 36.0–46.0)
Hemoglobin: 8.4 g/dL — ABNORMAL LOW (ref 12.0–15.0)
MCH: 29.3 pg (ref 26.0–34.0)
MCHC: 32.1 g/dL (ref 30.0–36.0)
MCV: 91.3 fL (ref 78.0–100.0)
Platelets: 115 10*3/uL — ABNORMAL LOW (ref 150–400)
RBC: 2.87 MIL/uL — ABNORMAL LOW (ref 3.87–5.11)
RDW: 15 % (ref 11.5–15.5)
WBC: 3.9 10*3/uL — AB (ref 4.0–10.5)

## 2015-07-28 LAB — GLUCOSE, CAPILLARY
GLUCOSE-CAPILLARY: 199 mg/dL — AB (ref 65–99)
Glucose-Capillary: 146 mg/dL — ABNORMAL HIGH (ref 65–99)

## 2015-07-28 MED ORDER — PREDNISONE 20 MG PO TABS: 40.0000 mg | ORAL_TABLET | Freq: Every day | ORAL | Status: DC

## 2015-07-28 MED ORDER — ONDANSETRON HCL 4 MG PO TABS
4.0000 mg | ORAL_TABLET | Freq: Four times a day (QID) | ORAL | Status: AC | PRN
Start: 2015-07-28 — End: ?

## 2015-07-28 MED ORDER — APIXABAN 5 MG PO TABS: 5.0000 mg | ORAL_TABLET | Freq: Two times a day (BID) | ORAL | Status: AC

## 2015-07-28 MED ORDER — OXYCODONE HCL 5 MG PO TABS: 5.0000 mg | ORAL_TABLET | ORAL | Status: DC | PRN

## 2015-07-28 MED ORDER — ACETAMINOPHEN 325 MG PO TABS: 650.0000 mg | ORAL_TABLET | Freq: Four times a day (QID) | ORAL | Status: AC | PRN

## 2015-07-28 MED ORDER — TRAZODONE HCL 50 MG PO TABS: 50.0000 mg | ORAL_TABLET | Freq: Every evening | ORAL | Status: DC | PRN

## 2015-07-28 MED ORDER — DIPHENOXYLATE-ATROPINE 2.5-0.025 MG PO TABS: 1.0000 | ORAL_TABLET | Freq: Three times a day (TID) | ORAL | Status: AC

## 2015-07-28 MED ORDER — PRO-STAT SUGAR FREE PO LIQD: 30.0000 mL | Freq: Two times a day (BID) | ORAL | Status: AC

## 2015-07-28 MED ORDER — APIXABAN 5 MG PO TABS: 10.0000 mg | ORAL_TABLET | Freq: Two times a day (BID) | ORAL | Status: DC

## 2015-07-28 NOTE — Discharge Summary (Signed)
Physician Discharge Summary  Jamie Wood:[id number] DOB: August 09, 1945 DOA: 07/09/2015  PCP: Cala Bradford, MD  Admit date: 07/09/2015 Discharge date: 07/28/2015  Time spent: 60 minutes  Recommendations for Outpatient Follow-up:  1. CBC on Monday 08/02/15- continue to check Hb intermittently- check  Iron/Ferretin levels in 1 wk as well  2. Continue to follow closely for bloody stool (hold Eliquis if severe) 3. Prednisone taper: 40 mg x 7 days, taper by 5 mg Q week until finished 4. Use TEDS daily for pedal edema- elevate feet  Discharge Condition: stable    Discharge Diagnoses:  Principal Problem:   Crohn's colitis (HCC) Active Problems:   AKI (acute kidney injury) -  Chronic kidney disease, stage 3   Anemia due to GI blood loss   Diarrhea in adult patient   Hypovolemia dehydration   Acute lower GI bleeding   Dehydration   Hyponatremia   Hypothyroidism   Vitamin D deficiency   Hyperlipidemia   Anxiety state   GERD   Diverticulosis of large intestine    History of present illness:  Jamie Wood is an 70 y.o. female with abdominal pain and diarrhea for about 1 month with a recent endoscopy and 07/03/2015 with biopsies revealing moderate inflammation in rectum and sigmoid areas. Treated empirically with Lialda and Rowasa enema. She did not improve and was started on Prednisone 40 mg daily. She followed up with GI and mentioned dizziness and ongoing loose stools and was sent to the ED and admitted for IV hydration, IV antibiotics and placed on IV steroids by GI and Imodium.   Hospital Course:  Colitis, inflammatory- GI bleed- anemia due to acute blood loss - biopsies done on colonoscopy on 5/23 as mentioned showed inflammation -  Repeat Colonoscopy 6/6 consistent with Crohn's - prednisone started with hopes to d/c home however subsequently discovered RUE DVT related to PICC- she was started on Heparin and switched back to IV steroids but GI bleed worsened  - quantiferon gold  indetermine- ppd placed and Remicaide subsequently started with improvement in symptoms- no further GI bleed despite being on anticoagulation - hepatitis serologies negative  - cont Lomotil per GI - Prednisone taper outlined by GI - will f/u with GI- appt is below  Acute blood loss anemia -s/p total 8 u PRBC-  Hb 8-9 range now   DVT right arm associated with PICC line - axillary DVT  - Heparin infusion stopped due to increase in bleeding - PICC removed - as bleeding has resolved, she has been started on Eliquis  Hyponatremia/ orthostatic hypotension/ Dehydration/ AKI on CKD 3 - Cr 1.2 >> now 1.1 (baseline)  - stool output dramatically improve from > 2 L a day to < 300 cc - much better hydrated now  Severe protein calorie malnutrition with anasarca - TNA initially was given but stopped (no PICC) - - cont supplements  Tricuspid valve regurgitation - Found on Echocardiogram on 6/8 showing trivial regurgitation. Follow up as outpatient with PCP  Procedures: Colonoscopy 6/6 Perianal skin tags found on perianal exam.  - Decreased sphincter tone found on digital rectal   exam.  - Patchy severe inflammation was found in the   entire examined colon. Biopsied.  - The examined portion of the ileum was normal.  - ? if this is Crohn's disease - bizarre polypoid   colitis - do not think neoplastic but is possible i    suppose.  Consultations:  GI- [place]  Discharge Exam: Filed Weights   07/26/15  1610 07/27/15 0536 07/28/15 0644  Weight: 102.2 kg (225 lb 5 oz) 105.7 kg (233 lb 0.4 oz) 100.7 kg (222 lb 0.1 oz)   Filed Vitals:   07/27/15 2147 07/28/15 0644  BP: 120/66 120/66  Pulse: 77 70  Temp: 98.6 F (37 C) 97.9 F (36.6 C)  Resp: 19 19    General: AAO x 3, no  distress Cardiovascular: RRR, no murmurs  Respiratory: clear to auscultation bilaterally GI: soft, non-tender, non-distended, bowel sound positive Extremities: mild pitting edema  Discharge Instructions You were cared for by a hospitalist during your hospital stay. If you have any questions about your discharge medications or the care you received while you were in the hospital after you are discharged, you can call the unit and asked to speak with the hospitalist on call if the hospitalist that took care of you is not available. Once you are discharged, your primary care physician will handle any further medical issues. Please note that NO REFILLS for any discharge medications will be authorized once you are discharged, as it is imperative that you return to your primary care physician (or establish a relationship with a primary care physician if you do not have one) for your aftercare needs so that they can reassess your need for medications and monitor your lab values.      Discharge Instructions    Discharge instructions    Complete by:  As directed   Low fiber diet     Increase activity slowly    Complete by:  As directed             Medication List    STOP taking these medications        AMBULATORY NON FORMULARY MEDICATION     aspirin 81 MG tablet     HYDROcodone-acetaminophen 5-325 MG tablet  Commonly known as:  NORCO/VICODIN     KLOR-CON M20 20 MEQ tablet  Generic drug:  potassium chloride SA     mesalamine 1.2 g EC tablet  Commonly known as:  LIALDA     mesalamine 4 g enema  Commonly known as:  ROWASA      TAKE these medications        acetaminophen 325 MG tablet  Commonly known as:  TYLENOL  Take 2 tablets (650 mg total) by mouth every 6 (six) hours as needed for mild pain (or Fever >/= 101).     ALPRAZolam 0.25 MG tablet  Commonly known as:  XANAX  Take 0.25 mg by mouth daily.     apixaban 5 MG Tabs tablet  Commonly known as:  ELIQUIS  Take 2 tablets  (10 mg total) by mouth 2 (two) times daily.     apixaban 5 MG Tabs tablet  Commonly known as:  ELIQUIS  Take 1 tablet (5 mg total) by mouth 2 (two) times daily.  Start taking on:  07/31/2015     diphenoxylate-atropine 2.5-0.025 MG tablet  Commonly known as:  LOMOTIL  Take 1 tablet by mouth 4 (four) times daily -  before meals and at bedtime.     feeding supplement (PRO-STAT SUGAR FREE 64) Liqd  Take 30 mLs by mouth 2 (two) times daily between meals.     fluticasone 50 MCG/ACT nasal spray  Commonly known as:  FLONASE  USE 2 SPRAYS EACH NOSTRIL DAILY AS NEEDED for allergies     levothyroxine 100 MCG tablet  Commonly known as:  SYNTHROID, LEVOTHROID  Take 100 mcg by mouth daily before breakfast.  ondansetron 4 MG tablet  Commonly known as:  ZOFRAN  Take 1 tablet (4 mg total) by mouth every 6 (six) hours as needed for nausea.     oxyCODONE 5 MG immediate release tablet  Commonly known as:  Oxy IR/ROXICODONE  Take 1 tablet (5 mg total) by mouth every 4 (four) hours as needed for moderate pain.     pantoprazole 40 MG tablet  Commonly known as:  PROTONIX  Take 40 mg by mouth daily.     predniSONE 20 MG tablet  Commonly known as:  DELTASONE  Take 2 tablets (40 mg total) by mouth daily before breakfast.     simvastatin 20 MG tablet  Commonly known as:  ZOCOR  Take 20 mg by mouth daily at 6 PM.     traZODone 50 MG tablet  Commonly known as:  DESYREL  Take 1 tablet (50 mg total) by mouth at bedtime as needed for sleep.     VITAMIN D (CHOLECALCIFEROL) PO  Take 5,000 Units by mouth.       Allergies  Allergen Reactions  . Benadryl [Diphenhydramine Hcl] Other (See Comments)    Jittery  . Codeine Nausea And Vomiting    Pt is unable to recall all reactions to codeine  . Meloxicam Other (See Comments)    Due to stage III kidney disease  . Penicillins     Has patient had a PCN reaction causing immediate rash, facial/tongue/throat swelling, SOB or lightheadedness with  hypotension: unknown Has patient had a PCN reaction causing severe rash involving mucus membranes or skin necrosis: unknown Has patient had a PCN reaction that required hospitalization: yes, drs visit Has patient had a PCN reaction occurring within the last 10 years: yes If all of the above answers are "NO", then may proceed with Cephalosporin use.   Marland Kitchen Propoxyphene Hcl Nausea And Vomiting  . Tramadol Nausea Only    jittery   Follow-up Information    Follow up with Ruffin Frederick, MD. Go on 08/12/2015.   Specialty:  Gastroenterology   Why:  For Follow up at 10:00 am for 10:15 am appointment   Contact information:   [address] [place] [address] [phone]        The results of significant diagnostics from this hospitalization (including imaging, microbiology, ancillary and laboratory) are listed below for reference.    Significant Diagnostic Studies: Ct Abdomen Pelvis Wo Contrast  07/25/2015  CLINICAL DATA:  Left lower quadrant, diarrhea, and hematochezia. History of Crohn's disease. EXAM: CT ABDOMEN AND PELVIS WITHOUT CONTRAST TECHNIQUE: Multidetector CT imaging of the abdomen and pelvis was performed following the standard protocol without IV contrast. COMPARISON:  CT scans from Jul 09, 2015 and October 18, 2010 FINDINGS: The 5 mm nodule in the right middle lobe is stable since 2012. The lung bases are otherwise normal. No free air. A small amount of free fluid is seen in the pelvis. Wall thickening and pericolonic stranding is again seen throughout most of the colon, involving the ascending, transverse, descending, and much of the sigmoid colon. The rectum is spared. The stomach and small bowel are normal. The liver, gallbladder, spleen, adrenal glands, and pancreas demonstrate no acute abnormalities. There is fatty deposition the pancreas which is stable. No aneurysm or adenopathy. Parapelvic cysts are seen in the kidneys. There are tiny and punctate stones in  the kidneys with no evidence of obstruction. No ureterectasis or ureteral stones. The pelvis demonstrates no adenopathy or mass. Patient is  status post hysterectomy. The bladder is decompressed. The visualized bones are stable. IMPRESSION: 1. Colitis from the ascending colon to the sigmoid colon, consistent with the patient's history of Crohn's disease. No significant change in the interval. Electronically Signed   By: Gerome Sam III M.D   On: 07/25/2015 11:52   Ct Abdomen Pelvis Wo Contrast  07/10/2015  CLINICAL DATA:  Acute onset of GI bleeding.  Initial encounter. EXAM: CT ABDOMEN AND PELVIS WITHOUT CONTRAST TECHNIQUE: Multidetector CT imaging of the abdomen and pelvis was performed following the standard protocol without IV contrast. COMPARISON:  CT of the abdomen and pelvis from 06/25/2015 FINDINGS: Trace right-sided pleural fluid is noted, with associated atelectasis. The liver and spleen are unremarkable in appearance. The gallbladder is within normal limits. The pancreas and adrenal glands are unremarkable. Scattered bilateral parapelvic renal cysts are seen. Minimal nonspecific perinephric stranding is noted bilaterally. There is no evidence of hydronephrosis. No renal or ureteral stones are identified. The small bowel is unremarkable in appearance. The stomach is within normal limits. No acute vascular abnormalities are seen. The patient is status post appendectomy. Vague soft tissue inflammation is noted along the ascending, descending and proximal sigmoid colon, concerning for acute infectious or inflammatory colitis. Underlying diverticulosis is noted along the transverse, descending and sigmoid colon. Trace fluid is seen tracking along the paracolic gutters. The bladder is mildly distended and grossly unremarkable. The patient is status post hysterectomy. The ovaries are grossly symmetric. No suspicious adnexal masses are seen. No inguinal lymphadenopathy is seen. No acute osseous abnormalities  are identified. IMPRESSION: 1. Vague soft tissue inflammation along the ascending, descending and proximal sigmoid colon, concerning for acute infectious or inflammatory colitis. Trace fluid tracking along the paracolic gutters. 2. Underlying diverticulosis along the transverse, descending and sigmoid colon. 3. Scattered bilateral parapelvic renal cysts again noted. 4. Trace right-sided pleural fluid, with associated atelectasis. Electronically Signed   By: Roanna Raider M.D.   On: 07/10/2015 00:00   Dg Chest 2 View  07/19/2015  CLINICAL DATA:  GI bleed, weakness, nonsmoker, long-term use immunosuppressant medication, possible infiltrates EXAM: CHEST  2 VIEW COMPARISON:  None. FINDINGS: Cardiomediastinal silhouette is unremarkable. Mild hyperinflation. No acute infiltrate or pleural effusion. No pulmonary edema. Mild thoracic spine osteopenia. Degenerative changes mid and lower thoracic spine. IMPRESSION: No active disease. Mild hyperinflation. Degenerative changes mid and lower thoracic spine. Electronically Signed   By: Natasha Mead M.D.   On: 07/19/2015 08:16    Microbiology: No results found for this or any previous visit (from the past 240 hour(s)).   Labs: Basic Metabolic Panel:  Recent Labs Lab 07/23/15 0506 07/24/15 0055 07/25/15 0734 07/26/15 0515 07/28/15 0534  NA 137 136 137 138 136  K 4.0 4.1 3.8 3.5 4.0  CL 105 102 102 102 99*  CO2 28 29 30 30 31   GLUCOSE 147* 161* 98 110* 158*  BUN 30* 32* 30* 26* 22*  CREATININE 1.10* 1.32* 1.16* 1.07* 1.18*  CALCIUM 7.6* 7.6* 7.4* 7.4* 7.6*   Liver Function Tests:  Recent Labs Lab 07/26/15 0515  AST 20  ALT 33  ALKPHOS 64  BILITOT 0.6  PROT 4.3*  ALBUMIN 1.6*   No results for input(s): LIPASE, AMYLASE in the last 168 hours. No results for input(s): AMMONIA in the last 168 hours. CBC:  Recent Labs Lab 07/24/15 0055 07/25/15 0734 07/26/15 0515 07/27/15 0523 07/28/15 0534  WBC 4.5 4.0 3.6* 4.0 3.9*  NEUTROABS  --   --    --  2.7  --   HGB 8.8* 9.3* 8.7* 8.0* 8.4*  HCT 25.8* 28.1* 26.3* 24.7* 26.2*  MCV 86.9 91.2 89.2 89.8 91.3  PLT 127* 120* 100* 109* 115*   Cardiac Enzymes: No results for input(s): CKTOTAL, CKMB, CKMBINDEX, TROPONINI in the last 168 hours. BNP: BNP (last 3 results) No results for input(s): BNP in the last 8760 hours.  ProBNP (last 3 results) No results for input(s): PROBNP in the last 8760 hours.  CBG:  Recent Labs Lab 07/27/15 0811 07/27/15 1230 07/27/15 1722 07/27/15 2150 07/28/15 0755  GLUCAP 117* 125* 134* 188* 146*       SignedCalvert Cantor, MD [facility] 07/28/2015, 10:42 AM

## 2015-07-28 NOTE — Progress Notes (Signed)
Disposition: [facility] faxed DC summary, clinicals to facility. Completed Placement Note Patient and family informed and agreed to transport to facility. Gave report number.  Patient fullcode. Medication list signed.  No other needs at this time.   Vivi Barrack, Theresia Majors, MSW Clinical Social Worker 5E and Psychiatric Service Line [phone] 07/28/2015  4:11 PM

## 2015-07-28 NOTE — Progress Notes (Signed)
Progress Note   Subjective  Jamie Wood is found laying in bed this morning with her brother by her bedside. She tells me that she did "very well overnight". She tells me that she is ready to go home. She describes that she did not have any bowel movements overnight and in fact only woke up once. She continues with some abdominal pain, though this is decreased from previous. Her brother tells me that she has plans to be transferred to [facility] at discharge for further therapy. She does request that her labs be drawn there instead of at our outpatient lab next Monday (CBC). She is aware that she has an appointment scheduled with Dr. Adela Lank in the future. This will be on her discharge instructions.   Objective   Vital signs in last 24 hours: Temp:  [97.9 F (36.6 C)-98.9 F (37.2 C)] 97.9 F (36.6 C) (06/14 0644) Pulse Rate:  [70-93] 70 (06/14 0644) Resp:  [19-20] 19 (06/14 0644) BP: (120-123)/(59-66) 120/66 mmHg (06/14 0644) SpO2:  [96 %-97 %] 96 % (06/14 0644) Weight:  [222 lb 0.1 oz (100.7 kg)] 222 lb 0.1 oz (100.7 kg) (06/14 0644) Last BM Date: 07/28/15 General: Caucasian female in NAD Heart: Regular rate and rhythm; no murmurs Lungs: Respirations even and unlabored, lungs CTA bilaterally Abdomen: Soft, mild generalized ttp and nondistended. Normal bowel sounds. Extremities: Without edema. Neurologic: Alert and oriented, grossly normal neurologically. Psych: Cooperative. Normal mood and affect.  Intake/Output from previous day: 06/13 0701 - 06/14 0700 In: -  Out: 1300 [Urine:1200; Stool:100] Intake/Output this shift: Total I/O In: -  Out: 200 [Urine:200]  Lab Results:  Recent Labs  07/26/15 0515 07/27/15 0523 07/28/15 0534  WBC 3.6* 4.0 3.9*  HGB 8.7* 8.0* 8.4*  HCT 26.3* 24.7* 26.2*  PLT 100* 109* 115*   BMET  Recent Labs  07/26/15 0515 07/28/15 0534  NA 138 136  K 3.5 4.0  CL 102 99*  CO2 30 31  GLUCOSE 110* 158*  BUN 26* 22*  CREATININE  1.07* 1.18*  CALCIUM 7.4* 7.6*   LFT  Recent Labs  07/26/15 0515  PROT 4.3*  ALBUMIN 1.6*  AST 20  ALT 33  ALKPHOS 64  BILITOT 0.6   PT/INR No results for input(s): LABPROT, INR in the last 72 hours.  Studies/Results: No results found.     Assessment / Plan:   Assessment: 1. Severe Colitis, Crohn's by repeat biopsies from 6/6: Patient improving, would recommend continuing prednisone 40 mg as outpatient for a total of 7 days with taper to decrease by 5 mg per week after that. Patient did receive first Remicade infusion on 6/8 and is set up for her next 2 induction doses. She is aware of when they are scheduled. She continues to improve clinically. CRP did rise momentarily, though it was found patient had not been receiving her oral prednisone dose for a total of 3 days. She was restarted on prednisone 40 yesterday. She is improved today. 2. Acute blood loss anemia: Hgb stable 3. RUE DVT: Patient now on Eliquis, day 5. Doing well.  Plan: 1. Continue Lomotil outpatient 2. Continue steroids Pred 40mg  x7 days, then decrease by 5 mg per week for 8 weeks 3. Patient should have repeat CBC on Monday 08/02/15. This can be drawn at [facility] with results sent to Dr. Adela Lank for her convenience. Our Triage nurse staff are arranging this. 4. Pt has follow up appt scheduled with Dr. Adela Lank on 08/12/15 at 10:15  am at [facility] office-checkin at 10:00 5. Discussed above with Dr. Adela Lank.  Thank you for your kind consultation, we will sign off.  Principal Problem:   Crohn's colitis (HCC) Active Problems:   Anxiety state   GERD   Diverticulosis of large intestine   AKI (acute kidney injury) (HCC)   Anemia due to GI blood loss   Diarrhea in adult patient   Hypovolemia dehydration   Absolute anemia   Acute lower GI bleeding   Dehydration     LOS: 19 days   Unk Lightning  07/28/2015, 10:00 AM

## 2015-07-29 ENCOUNTER — Encounter: Payer: Self-pay | Admitting: Adult Health

## 2015-07-29 ENCOUNTER — Other Ambulatory Visit: Payer: Self-pay

## 2015-07-29 ENCOUNTER — Non-Acute Institutional Stay (SKILLED_NURSING_FACILITY): Payer: Medicare Other | Admitting: Adult Health

## 2015-07-29 DIAGNOSIS — R5381 Other malaise: Secondary | ICD-10-CM

## 2015-07-29 DIAGNOSIS — D500 Iron deficiency anemia secondary to blood loss (chronic): Secondary | ICD-10-CM | POA: Diagnosis not present

## 2015-07-29 DIAGNOSIS — G4700 Insomnia, unspecified: Secondary | ICD-10-CM

## 2015-07-29 DIAGNOSIS — I82A11 Acute embolism and thrombosis of right axillary vein: Secondary | ICD-10-CM | POA: Insufficient documentation

## 2015-07-29 DIAGNOSIS — E785 Hyperlipidemia, unspecified: Secondary | ICD-10-CM

## 2015-07-29 DIAGNOSIS — D62 Acute posthemorrhagic anemia: Secondary | ICD-10-CM

## 2015-07-29 DIAGNOSIS — K50111 Crohn's disease of large intestine with rectal bleeding: Secondary | ICD-10-CM

## 2015-07-29 DIAGNOSIS — E43 Unspecified severe protein-calorie malnutrition: Secondary | ICD-10-CM

## 2015-07-29 DIAGNOSIS — E559 Vitamin D deficiency, unspecified: Secondary | ICD-10-CM

## 2015-07-29 DIAGNOSIS — K922 Gastrointestinal hemorrhage, unspecified: Secondary | ICD-10-CM

## 2015-07-29 DIAGNOSIS — E039 Hypothyroidism, unspecified: Secondary | ICD-10-CM | POA: Diagnosis not present

## 2015-07-29 DIAGNOSIS — J309 Allergic rhinitis, unspecified: Secondary | ICD-10-CM

## 2015-07-29 DIAGNOSIS — I808 Phlebitis and thrombophlebitis of other sites: Secondary | ICD-10-CM

## 2015-07-29 DIAGNOSIS — F411 Generalized anxiety disorder: Secondary | ICD-10-CM

## 2015-07-29 NOTE — Progress Notes (Addendum)
Patient ID: Jamie Wood, female   DOB: Oct 21, 1945, 70 y.o.   MRN: [id number]    DATE:  07/29/2015   MRN:  [id number]  BIRTHDAY: [birth year]  Facility:  Nursing Home Location:  [facility]  Nursing Home Room Number: 1205-P  LEVEL OF CARE:  SNF (31)  Contact Information    Name Relation Home Work Mobile   Mathes,Sam Brother [phone]     Gwen Pounds [phone]  [phone]   No name specified           Code Status History    Date Active Date Inactive Code Status Order ID Comments User Context   07/09/2015  5:35 PM 07/28/2015  4:44 PM Full Code [id number]  Shon Hale, MD Inpatient       Chief Complaint  Patient presents with  . Hospitalization Follow-up    HISTORY OF PRESENT ILLNESS:  This is a 70 year old female who has been admitted to [facility] on 07/30/15 from [facility]. She was treated for inflammatory colitis, GI bleed and anemia. She was transfused 8 units of packed RBC. Biopsy done on colonoscopy on 5/23 showed inflammation and repeat colonoscopy done on 6/6 was consistent for Crohn's disease. Prednisone was started. She was diagnosed with right upper extremity DVT related to PICC for which she was started on heparin and switched back to IV steroids but GI bleed worsened. Quantiferpn gold indetermine-ppd placed and Remicade subsequently started with improvement in symptoms. No further GI bleed despite being on anticoagulation.  She has been admitted for a short-term rehabilitation.  PAST MEDICAL HISTORY:  Past Medical History  Diagnosis Date  . Thyroid disease   . Hypercholesterolemia   . Arthritis   . GERD (gastroesophageal reflux disease)   . Diverticulitis   . Renal insufficiency     stage 3 kidney disease  . Sleep apnea     occasionally wears a c-pap  . Anxiety   . Osteoporosis   . Crohn's colitis (HCC) 07/09/2015    Colonoscopy 2017 biopsies:  Diagnosis 1. Surgical [P], random left colon - BENIGN COLONIC MUCOSA  WITH MILD ACTIVE COLITIS, SEE COMMENT. 2. Surgical [P], left colon - BENIGN COLONIC MUCOSA WITH MILD ACTIVE COLITIS, SEE COMMENT. Microscopic Comment 1. and 2. Both specimens show similar features with fragments of benign appearing colonic mucosa generally displaying orderly crypt architecture but with     CURRENT MEDICATIONS: Reviewed  Patient's Medications  New Prescriptions   No medications on file  Previous Medications   ACETAMINOPHEN (TYLENOL) 325 MG TABLET    Take 2 tablets (650 mg total) by mouth every 6 (six) hours as needed for mild pain (or Fever >/= 101).   ALPRAZOLAM (XANAX) 0.25 MG TABLET    Take 0.25 mg by mouth daily.   AMINO ACIDS-PROTEIN HYDROLYS (FEEDING SUPPLEMENT, PRO-STAT SUGAR FREE 64,) LIQD    Take 30 mLs by mouth 2 (two) times daily between meals.   APIXABAN (ELIQUIS) 5 MG TABS TABLET    Take 1 tablet (5 mg total) by mouth 2 (two) times daily.   APIXABAN (ELIQUIS) 5 MG TABS TABLET    Take 2 tablets (10 mg total) by mouth 2 (two) times daily.   DIPHENOXYLATE-ATROPINE (LOMOTIL) 2.5-0.025 MG TABLET    Take 1 tablet by mouth 4 (four) times daily -  before meals and at bedtime.   FLUTICASONE (FLONASE) 50 MCG/ACT NASAL SPRAY    USE 2 SPRAYS EACH NOSTRIL DAILY AS NEEDED for allergies   LEVOTHYROXINE (SYNTHROID, LEVOTHROID) 100  MCG TABLET    Take 100 mcg by mouth daily before breakfast.   ONDANSETRON (ZOFRAN) 4 MG TABLET    Take 1 tablet (4 mg total) by mouth every 6 (six) hours as needed for nausea.   OXYCODONE (OXY-IR) 5 MG CAPSULE    Take 5 mg by mouth every 4 (four) hours as needed.   PANTOPRAZOLE (PROTONIX) 40 MG TABLET    Take 40 mg by mouth daily.   PREDNISONE (DELTASONE) 20 MG TABLET    Take 2 tablets (40 mg total) by mouth daily before breakfast.   PREDNISONE (DELTASONE) 5 MG TABLET    Take 5 mg by mouth daily with breakfast. Take 7 tablets before breakfast to = 35 mg qd x7 days; take 6 tablets to = 30 mg qd x7 days, take 5 tablets to = 25 mg x7 days; take 4 tablets to =  20 mg qd x7 days; take 3 tablets to = 15 mg qd x7 days; take 2 tablets to = 10 mg qd x7 days; take 1 tablet qd to = 5 mg x7 days.   SIMVASTATIN (ZOCOR) 20 MG TABLET    Take 20 mg by mouth daily at 6 PM.    TRAZODONE (DESYREL) 50 MG TABLET    Take 1 tablet (50 mg total) by mouth at bedtime as needed for sleep.   VITAMIN D, CHOLECALCIFEROL, PO    Take 5,000 Units by mouth daily. D3  Modified Medications   No medications on file  Discontinued Medications   OXYCODONE (OXY IR/ROXICODONE) 5 MG IMMEDIATE RELEASE TABLET    Take 1 tablet (5 mg total) by mouth every 4 (four) hours as needed for moderate pain.     Allergies  Allergen Reactions  . Benadryl [Diphenhydramine Hcl] Other (See Comments)    Jittery  . Codeine Nausea And Vomiting    Pt is unable to recall all reactions to codeine  . Meloxicam Other (See Comments)    Due to stage III kidney disease  . Penicillins     Has patient had a PCN reaction causing immediate rash, facial/tongue/throat swelling, SOB or lightheadedness with hypotension: unknown Has patient had a PCN reaction causing severe rash involving mucus membranes or skin necrosis: unknown Has patient had a PCN reaction that required hospitalization: yes, drs visit Has patient had a PCN reaction occurring within the last 10 years: yes If all of the above answers are "NO", then may proceed with Cephalosporin use.   Marland Kitchen Propoxyphene Hcl Nausea And Vomiting  . Tramadol Nausea Only    jittery     REVIEW OF SYSTEMS:  GENERAL: no change in appetite, no fatigue, no weight changes, no fever, chills or weakness EYES: Denies change in vision, dry eyes, eye pain, itching or discharge EARS: Denies change in hearing, ringing in ears, or earache NOSE: Denies nasal congestion or epistaxis MOUTH and THROAT: Denies oral discomfort, gingival pain or bleeding, pain from teeth or hoarseness   RESPIRATORY: no cough, SOB, DOE, wheezing, hemoptysis CARDIAC: no chest pain or palpitations GI:  no abdominal pain, diarrhea, constipation, heart burn, nausea or vomiting GU: Denies dysuria, frequency, hematuria, incontinence, or discharge PSYCHIATRIC: Denies feeling of depression or anxiety. No report of hallucinations, insomnia, paranoia, or agitation   PHYSICAL EXAMINATION  GENERAL APPEARANCE: Well nourished. In no acute distress. Obese SKIN:  Skin is warm and dry. HEAD: Normal in size and contour. No evidence of trauma EYES: Lids open and close normally. No blepharitis, entropion or ectropion. PERRL. Conjunctivae are clear  and sclerae are white. Lenses are without opacity EARS: Pinnae are normal. Patient hears normal voice tunes of the examiner MOUTH and THROAT: Lips are without lesions. Oral mucosa is moist and without lesions. Tongue is normal in shape, size, and color and without lesions NECK: supple, trachea midline, no neck masses, no thyroid tenderness, no thyromegaly LYMPHATICS: no LAN in the neck, no supraclavicular LAN RESPIRATORY: breathing is even & unlabored, BS CTAB CARDIAC: RRR, no murmur,no extra heart sounds, BLE edema 2+ GI: abdomen soft, normal BS, no masses, no tenderness, no hepatomegaly, no splenomegaly EXTREMITIES:  Able to move 4 extremities PSYCHIATRIC: Alert and oriented X 3. Affect and behavior are appropriate  LABS/RADIOLOGY: Labs reviewed: Basic Metabolic Panel:  Recent Labs  16/10/96 0421 07/12/15 0755 07/13/15 0545  07/25/15 0734 07/26/15 0515 07/28/15 0534  NA 135 135 133*  < > 137 138 136  K 5.3* 4.1 4.3  < > 3.8 3.5 4.0  CL 108 106 104  < > 102 102 99*  CO2 < > GLUCOSE 172* 201* 167*  < > 98 110* 158*  BUN < > 30* 26* 22*  CREATININE 1.18* 1.23* 1.22*  < > 1.16* 1.07* 1.18*  CALCIUM 7.3* 7.6* 7.6*  < > 7.4* 7.4* 7.6*  MG 2.1 2.0 1.9  --   --   --   --   PHOS 3.3 3.0 3.2  --   --   --   --   < > = values in this interval not displayed. Liver Function Tests:  Recent Labs  07/13/15 0545  07/21/15 0836 07/26/15 0515  AST ALT 17 25 33  ALKPHOS 53 55 64  BILITOT 0.2* 0.4 0.6  PROT 4.5* 4.3* 4.3*  ALBUMIN 1.7* 1.7* 1.6*    Recent Labs  06/25/15 0730 07/09/15 1423  LIPASE 25 34   CBC:  Recent Labs  07/06/15 1133  07/12/15 0755  07/26/15 0515 07/27/15 0523 07/28/15 0534  WBC 10.3  < > 15.7*  < > 3.6* 4.0 3.9*  NEUTROABS 8.2*  --  13.5*  --   --  2.7  --   HGB 11.6*  < > 10.7*  < > 8.7* 8.0* 8.4*  HCT 34.6*  < > 31.1*  < > 26.3* 24.7* 26.2*  MCV 89.8  < > 86.4  < > 89.2 89.8 91.3  PLT 460.0*  < > 318  < > 100* 109* 115*  < > = values in this interval not displayed.  CBG:  Recent Labs  07/27/15 2150 07/28/15 0755 07/28/15 1215  GLUCAP 188* 146* 199*     Ct Abdomen Pelvis Wo Contrast  07/25/2015  CLINICAL DATA:  Left lower quadrant, diarrhea, and hematochezia. History of Crohn's disease. EXAM: CT ABDOMEN AND PELVIS WITHOUT CONTRAST TECHNIQUE: Multidetector CT imaging of the abdomen and pelvis was performed following the standard protocol without IV contrast. COMPARISON:  CT scans from Jul 09, 2015 and October 18, 2010 FINDINGS: The 5 mm nodule in the right middle lobe is stable since 2012. The lung bases are otherwise normal. No free air. A small amount of free fluid is seen in the pelvis. Wall thickening and pericolonic stranding is again seen throughout most of the colon, involving the ascending, transverse, descending, and much of the sigmoid colon. The rectum is spared. The stomach and small bowel are normal. The liver, gallbladder, spleen, adrenal glands, and pancreas demonstrate no acute abnormalities.  There is fatty deposition the pancreas which is stable. No aneurysm or adenopathy. Parapelvic cysts are seen in the kidneys. There are tiny and punctate stones in the kidneys with no evidence of obstruction. No ureterectasis or ureteral stones. The pelvis demonstrates no adenopathy or mass. Patient is status post hysterectomy. The bladder is  decompressed. The visualized bones are stable. IMPRESSION: 1. Colitis from the ascending colon to the sigmoid colon, consistent with the patient's history of Crohn's disease. No significant change in the interval. Electronically Signed   By: Gerome Sam III M.D   On: 07/25/2015 11:52   Ct Abdomen Pelvis Wo Contrast  07/10/2015  CLINICAL DATA:  Acute onset of GI bleeding.  Initial encounter. EXAM: CT ABDOMEN AND PELVIS WITHOUT CONTRAST TECHNIQUE: Multidetector CT imaging of the abdomen and pelvis was performed following the standard protocol without IV contrast. COMPARISON:  CT of the abdomen and pelvis from 06/25/2015 FINDINGS: Trace right-sided pleural fluid is noted, with associated atelectasis. The liver and spleen are unremarkable in appearance. The gallbladder is within normal limits. The pancreas and adrenal glands are unremarkable. Scattered bilateral parapelvic renal cysts are seen. Minimal nonspecific perinephric stranding is noted bilaterally. There is no evidence of hydronephrosis. No renal or ureteral stones are identified. The small bowel is unremarkable in appearance. The stomach is within normal limits. No acute vascular abnormalities are seen. The patient is status post appendectomy. Vague soft tissue inflammation is noted along the ascending, descending and proximal sigmoid colon, concerning for acute infectious or inflammatory colitis. Underlying diverticulosis is noted along the transverse, descending and sigmoid colon. Trace fluid is seen tracking along the paracolic gutters. The bladder is mildly distended and grossly unremarkable. The patient is status post hysterectomy. The ovaries are grossly symmetric. No suspicious adnexal masses are seen. No inguinal lymphadenopathy is seen. No acute osseous abnormalities are identified. IMPRESSION: 1. Vague soft tissue inflammation along the ascending, descending and proximal sigmoid colon, concerning for acute infectious or inflammatory colitis.  Trace fluid tracking along the paracolic gutters. 2. Underlying diverticulosis along the transverse, descending and sigmoid colon. 3. Scattered bilateral parapelvic renal cysts again noted. 4. Trace right-sided pleural fluid, with associated atelectasis. Electronically Signed   By: Roanna Raider M.D.   On: 07/10/2015 00:00   Dg Chest 2 View  07/19/2015  CLINICAL DATA:  GI bleed, weakness, nonsmoker, long-term use immunosuppressant medication, possible infiltrates EXAM: CHEST  2 VIEW COMPARISON:  None. FINDINGS: Cardiomediastinal silhouette is unremarkable. Mild hyperinflation. No acute infiltrate or pleural effusion. No pulmonary edema. Mild thoracic spine osteopenia. Degenerative changes mid and lower thoracic spine. IMPRESSION: No active disease. Mild hyperinflation. Degenerative changes mid and lower thoracic spine. Electronically Signed   By: Natasha Mead M.D.   On: 07/19/2015 08:16    ASSESSMENT/PLAN:  Physical deconditioning - for rehabilitation; PT and OT  Crohn's colitis - continue prednisone taper and check CBG daily  X 2 weeks; Lomotil 2.5-0.0 25 mg 1 tab by mouth before meals and at bedtime; follow-up with Dr. Ruffin Frederick, gastroenterology, on 08/12/15  Anemia, acute blood loss - S/P transfusion of 8 units packed RBC; check CBC  Insomnia - continue trazodone 50 mg 1 tab by mouth daily at bedtime when necessary  Allergic rhinitis - continue Flonase 50 g take 2 sprays into each nostril daily when necessary  Hypothyroidism - continue levothyroxine 100 g 1 tab by mouth daily; check TSH  DVT, RUE - continue Eliquis 10 mg by mouth twice a day till 6/16 then Eliquis 5  mg 1 tab by mouth twice a day  GI bleed - continue Protonix 40 mg 1 tab by mouth daily  Vitamin D deficiency - continue vitamin D3 5000 units 1 tab by mouth daily  Anxiety - mood is stable; continue Xanax 0.25 mg 1 tab by mouth daily  Hyperlipidemia - continue simvastatin 20 mg 1 tab by mouth daily Lab Results   Component Value Date   TRIG 273* 07/12/2015    Protein calorie malnutrition, severe - albumin 1.6, low; continue Pro-Stat sugar-free 30 mL by mouth twice a day; RD consult     Goals of care:  Short-term rehabilitation    Kenard Gower, NP [facility] [phone]

## 2015-07-30 LAB — BASIC METABOLIC PANEL
BUN: 25 mg/dL — AB (ref 4–21)
Creatinine: 1.3 mg/dL — AB (ref 0.5–1.1)
GLUCOSE: 139 mg/dL
Potassium: 4.8 mmol/L (ref 3.4–5.3)
Sodium: 146 mmol/L (ref 137–147)

## 2015-07-30 LAB — TSH: TSH: 1.74 u[IU]/mL (ref 0.41–5.90)

## 2015-08-02 ENCOUNTER — Other Ambulatory Visit: Payer: Self-pay | Admitting: *Deleted

## 2015-08-02 DIAGNOSIS — K50111 Crohn's disease of large intestine with rectal bleeding: Secondary | ICD-10-CM

## 2015-08-02 LAB — CBC AND DIFFERENTIAL
HCT: 25 % — AB (ref 36–46)
HEMOGLOBIN: 7.8 g/dL — AB (ref 12.0–16.0)
PLATELETS: 335 10*3/uL (ref 150–399)
WBC: 5.2 10^3/mL

## 2015-08-03 ENCOUNTER — Non-Acute Institutional Stay (SKILLED_NURSING_FACILITY): Payer: Medicare Other | Admitting: Internal Medicine

## 2015-08-03 ENCOUNTER — Encounter: Payer: Self-pay | Admitting: Internal Medicine

## 2015-08-03 DIAGNOSIS — E039 Hypothyroidism, unspecified: Secondary | ICD-10-CM

## 2015-08-03 DIAGNOSIS — E785 Hyperlipidemia, unspecified: Secondary | ICD-10-CM

## 2015-08-03 DIAGNOSIS — K529 Noninfective gastroenteritis and colitis, unspecified: Secondary | ICD-10-CM

## 2015-08-03 DIAGNOSIS — R739 Hyperglycemia, unspecified: Secondary | ICD-10-CM | POA: Diagnosis not present

## 2015-08-03 DIAGNOSIS — R5381 Other malaise: Secondary | ICD-10-CM | POA: Diagnosis not present

## 2015-08-03 DIAGNOSIS — K922 Gastrointestinal hemorrhage, unspecified: Secondary | ICD-10-CM

## 2015-08-03 DIAGNOSIS — G4700 Insomnia, unspecified: Secondary | ICD-10-CM | POA: Diagnosis not present

## 2015-08-03 DIAGNOSIS — D62 Acute posthemorrhagic anemia: Secondary | ICD-10-CM

## 2015-08-03 DIAGNOSIS — R600 Localized edema: Secondary | ICD-10-CM

## 2015-08-03 DIAGNOSIS — N183 Chronic kidney disease, stage 3 (moderate): Secondary | ICD-10-CM | POA: Diagnosis not present

## 2015-08-03 DIAGNOSIS — E46 Unspecified protein-calorie malnutrition: Secondary | ICD-10-CM | POA: Diagnosis not present

## 2015-08-03 NOTE — Progress Notes (Signed)
LOCATION: [facility]  PCP: Cala Bradford, MD   Code Status: Full Code  Goals of care: Advanced Directive information Advanced Directives 07/09/2015  Does patient have an advance directive? Yes  Type of Advance Directive Living will  Does patient want to make changes to advanced directive? No - Patient declined  Copy of advanced directive(s) in chart? No - copy requested       Extended Emergency Contact Information Primary Emergency Contact: Halberg,Sam Address: [address]          [place], [place] [address] [place] Home Phone: [phone] Mobile Phone: [phone] Relation: Brother Secondary Emergency Contact: Lacey Jensen Address: [address]          [place], [place] [address] [place] Home Phone: [phone] Mobile Phone: [phone] Relation: Other   Allergies  Allergen Reactions  . Benadryl [Diphenhydramine Hcl] Other (See Comments)    Jittery  . Codeine Nausea And Vomiting    Pt is unable to recall all reactions to codeine  . Meloxicam Other (See Comments)    Due to stage III kidney disease  . Penicillins     Has patient had a PCN reaction causing immediate rash, facial/tongue/throat swelling, SOB or lightheadedness with hypotension: unknown Has patient had a PCN reaction causing severe rash involving mucus membranes or skin necrosis: unknown Has patient had a PCN reaction that required hospitalization: yes, drs visit Has patient had a PCN reaction occurring within the last 10 years: yes If all of the above answers are "NO", then may proceed with Cephalosporin use.   Marland Kitchen Propoxyphene Hcl Nausea And Vomiting  . Tramadol Nausea Only    jittery    Chief Complaint  Patient presents with  . New Admit To SNF    New Admission     HPI:  Patient is a 70 y.o. female seen today for short term rehabilitation post hospital admission from 07/09/15-07/28/15 with chron's colitis with gi bleed and blood loss anemia. She required 8  u prbc transfusion and was placed on prednisone. She had RUE DVT and was started on eliquis and her PICC line was removed. She also had acute on ckd stage 3. She was started on remicaide later. She is seen in her room today.   Review of Systems:  Constitutional: Negative for fever, chills, diaphoresis. Feels weak and tired. HENT: Negative for headache, congestion Eyes: Negative for blurred vision. Wears glasses.  Respiratory: Negative for cough and wheezing. Positive for shortness of breath with exertion.   Cardiovascular: Negative for chest pain, palpitations, leg swelling.  Gastrointestinal: Negative for heartburn, nausea, vomiting, abdominal pain. Last bowel movement was this morning. Bleed +. Abdominal wall soreness LLQ before and after bowel movement.  Genitourinary: Negative for dysuria and flank pain.  Musculoskeletal: Negative for back pain, fall in the facility.  Skin: Negative for itching, rash.  Neurological: Negative for dizziness. Psychiatric/Behavioral: Negative for depression   Past Medical History  Diagnosis Date  . Thyroid disease   . Hypercholesterolemia   . Arthritis   . GERD (gastroesophageal reflux disease)   . Diverticulitis   . Renal insufficiency     stage 3 kidney disease  . Sleep apnea     occasionally wears a c-pap  . Anxiety   . Osteoporosis   . Crohn's colitis (HCC) 07/09/2015    Colonoscopy 2017 biopsies:  Diagnosis 1. Surgical [P], random left colon - BENIGN COLONIC MUCOSA WITH MILD ACTIVE COLITIS, SEE COMMENT. 2. Surgical [P], left colon -  BENIGN COLONIC MUCOSA WITH MILD ACTIVE COLITIS, SEE COMMENT. Microscopic Comment 1. and 2. Both specimens show similar features with fragments of benign appearing colonic mucosa generally displaying orderly crypt architecture but with   Past Surgical History  Procedure Laterality Date  . Partial hysterectomy  1978    vaginal  . Tonsillectomy  1967  . Appendectomy  1957  . Colonoscopy    . Colonoscopy with  propofol N/A 07/20/2015    Procedure: COLONOSCOPY WITH PROPOFOL;  Surgeon: Iva Boop, MD;  Location: [facility];  Service: Endoscopy;  Laterality: N/A;   Social History:   reports that she has never smoked. She has never used smokeless tobacco. She reports that she does not drink alcohol or use illicit drugs.  Family History  Problem Relation Age of Onset  . Colon cancer Neg Hx   . Pancreatic cancer Neg Hx   . Stomach cancer Neg Hx   . Esophageal cancer Neg Hx   . Rectal cancer Neg Hx     Medications:   Medication List       This list is accurate as of: 08/03/15  3:41 PM.  Always use your most recent med list.               acetaminophen 325 MG tablet  Commonly known as:  TYLENOL  Take 2 tablets (650 mg total) by mouth every 6 (six) hours as needed for mild pain (or Fever >/= 101).     ALPRAZolam 0.25 MG tablet  Commonly known as:  XANAX  Take 0.25 mg by mouth daily.     apixaban 5 MG Tabs tablet  Commonly known as:  ELIQUIS  Take 2 tablets (10 mg total) by mouth 2 (two) times daily.     apixaban 5 MG Tabs tablet  Commonly known as:  ELIQUIS  Take 1 tablet (5 mg total) by mouth 2 (two) times daily.     diphenoxylate-atropine 2.5-0.025 MG tablet  Commonly known as:  LOMOTIL  Take 1 tablet by mouth 4 (four) times daily -  before meals and at bedtime.     feeding supplement (PRO-STAT SUGAR FREE 64) Liqd  Take 30 mLs by mouth 2 (two) times daily between meals.     fluticasone 50 MCG/ACT nasal spray  Commonly known as:  FLONASE  USE 2 SPRAYS EACH NOSTRIL DAILY AS NEEDED for allergies     levothyroxine 100 MCG tablet  Commonly known as:  SYNTHROID, LEVOTHROID  Take 100 mcg by mouth daily before breakfast.     ondansetron 4 MG tablet  Commonly known as:  ZOFRAN  Take 1 tablet (4 mg total) by mouth every 6 (six) hours as needed for nausea.     oxycodone 5 MG capsule  Commonly known as:  OXY-IR  Take 5 mg by mouth every 4 (four) hours as needed.      pantoprazole 40 MG tablet  Commonly known as:  PROTONIX  Take 40 mg by mouth daily.     predniSONE 5 MG tablet  Commonly known as:  DELTASONE  Take 5 mg by mouth daily with breakfast. Take 7 tablets before breakfast to = 35 mg qd x7 days; take 6 tablets to = 30 mg qd x7 days, take 5 tablets to = 25 mg x7 days; take 4 tablets to = 20 mg qd x7 days; take 3 tablets to = 15 mg qd x7 days; take 2 tablets to = 10 mg qd x7 days; take 1 tablet qd to =  5 mg x7 days.     predniSONE 20 MG tablet  Commonly known as:  DELTASONE  Take 2 tablets (40 mg total) by mouth daily before breakfast.     simvastatin 20 MG tablet  Commonly known as:  ZOCOR  Take 20 mg by mouth daily at 6 PM.     traZODone 50 MG tablet  Commonly known as:  DESYREL  Take 1 tablet (50 mg total) by mouth at bedtime as needed for sleep.     VITAMIN D (CHOLECALCIFEROL) PO  Take 5,000 Units by mouth daily. D3        Immunizations: Immunization History  Administered Date(s) Administered  . PPD Test 07/19/2015     Physical Exam:  Filed Vitals:   08/03/15 1535  BP: 141/77  Pulse: 88  Temp: 96.5 F (35.8 C)  TempSrc: Oral  Resp: 18  Height: 5\' 4"  (1.626 m)  Weight: 234 lb (106.142 kg)  SpO2: 97%   Body mass index is 40.15 kg/(m^2).  General- elderly female, morbidly obese, in no acute distress Head- normocephalic, atraumatic Nose- no nasal discharge Throat- moist mucus membrane Eyes- PERRLA, EOMI, no pallor, no icterus Neck- no cervical lymphadenopathy Cardiovascular- normal s1,s2, no murmur Respiratory- bilateral clear to auscultation Abdomen- bowel sounds present, soft, LLQ tenderness on palpation Musculoskeletal- able to move all 4 extremities, generalized weakness, 2+ leg edema Neurological- alert and oriented to person, place and time Skin- warm and dry Psychiatry- normal mood and affect    Labs reviewed: Basic Metabolic Panel:  Recent Labs  16/10/96 0421 07/12/15 0755 07/13/15 0545   07/25/15 0734 07/26/15 0515 07/28/15 0534 07/30/15  NA 135 135 133*  < > 137 138 136 146  K 5.3* 4.1 4.3  < > 3.8 3.5 4.0 4.8  CL 108 106 104  < > 102 102 99*  --   CO2 22 23 25   < > 30 30 31   --   GLUCOSE 172* 201* 167*  < > 98 110* 158*  --   BUN 16 17 19   < > 30* 26* 22* 25*  CREATININE 1.18* 1.23* 1.22*  < > 1.16* 1.07* 1.18* 1.3*  CALCIUM 7.3* 7.6* 7.6*  < > 7.4* 7.4* 7.6*  --   MG 2.1 2.0 1.9  --   --   --   --   --   PHOS 3.3 3.0 3.2  --   --   --   --   --   < > = values in this interval not displayed. Liver Function Tests:  Recent Labs  07/13/15 0545 07/21/15 0836 07/26/15 0515  AST 19 22 20   ALT 17 25 33  ALKPHOS 53 55 64  BILITOT 0.2* 0.4 0.6  PROT 4.5* 4.3* 4.3*  ALBUMIN 1.7* 1.7* 1.6*    Recent Labs  06/25/15 0730 07/09/15 1423  LIPASE 25 34   No results for input(s): AMMONIA in the last 8760 hours. CBC:  Recent Labs  07/06/15 1133  07/12/15 0755  07/26/15 0515 07/27/15 0523 07/28/15 0534 08/02/15  WBC 10.3  < > 15.7*  < > 3.6* 4.0 3.9* 5.2  NEUTROABS 8.2*  --  13.5*  --   --  2.7  --   --   HGB 11.6*  < > 10.7*  < > 8.7* 8.0* 8.4* 7.8*  HCT 34.6*  < > 31.1*  < > 26.3* 24.7* 26.2* 25*  MCV 89.8  < > 86.4  < > 89.2 89.8 91.3  --  PLT 460.0*  < > 318  < > 100* 109* 115* 335  < > = values in this interval not displayed. Cardiac Enzymes: No results for input(s): CKTOTAL, CKMB, CKMBINDEX, TROPONINI in the last 8760 hours. BNP: Invalid input(s): POCBNP CBG:  Recent Labs  07/27/15 2150 07/28/15 0755 07/28/15 1215  GLUCAP 188* 146* 199*    Radiological Exams: Ct Abdomen Pelvis Wo Contrast  07/25/2015  CLINICAL DATA:  Left lower quadrant, diarrhea, and hematochezia. History of Crohn's disease. EXAM: CT ABDOMEN AND PELVIS WITHOUT CONTRAST TECHNIQUE: Multidetector CT imaging of the abdomen and pelvis was performed following the standard protocol without IV contrast. COMPARISON:  CT scans from Jul 09, 2015 and October 18, 2010 FINDINGS: The 5 mm  nodule in the right middle lobe is stable since 2012. The lung bases are otherwise normal. No free air. A small amount of free fluid is seen in the pelvis. Wall thickening and pericolonic stranding is again seen throughout most of the colon, involving the ascending, transverse, descending, and much of the sigmoid colon. The rectum is spared. The stomach and small bowel are normal. The liver, gallbladder, spleen, adrenal glands, and pancreas demonstrate no acute abnormalities. There is fatty deposition the pancreas which is stable. No aneurysm or adenopathy. Parapelvic cysts are seen in the kidneys. There are tiny and punctate stones in the kidneys with no evidence of obstruction. No ureterectasis or ureteral stones. The pelvis demonstrates no adenopathy or mass. Patient is status post hysterectomy. The bladder is decompressed. The visualized bones are stable. IMPRESSION: 1. Colitis from the ascending colon to the sigmoid colon, consistent with the patient's history of Crohn's disease. No significant change in the interval. Electronically Signed   By: Gerome Sam III M.D   On: 07/25/2015 11:52   Ct Abdomen Pelvis Wo Contrast  07/10/2015  CLINICAL DATA:  Acute onset of GI bleeding.  Initial encounter. EXAM: CT ABDOMEN AND PELVIS WITHOUT CONTRAST TECHNIQUE: Multidetector CT imaging of the abdomen and pelvis was performed following the standard protocol without IV contrast. COMPARISON:  CT of the abdomen and pelvis from 06/25/2015 FINDINGS: Trace right-sided pleural fluid is noted, with associated atelectasis. The liver and spleen are unremarkable in appearance. The gallbladder is within normal limits. The pancreas and adrenal glands are unremarkable. Scattered bilateral parapelvic renal cysts are seen. Minimal nonspecific perinephric stranding is noted bilaterally. There is no evidence of hydronephrosis. No renal or ureteral stones are identified. The small bowel is unremarkable in appearance. The stomach is  within normal limits. No acute vascular abnormalities are seen. The patient is status post appendectomy. Vague soft tissue inflammation is noted along the ascending, descending and proximal sigmoid colon, concerning for acute infectious or inflammatory colitis. Underlying diverticulosis is noted along the transverse, descending and sigmoid colon. Trace fluid is seen tracking along the paracolic gutters. The bladder is mildly distended and grossly unremarkable. The patient is status post hysterectomy. The ovaries are grossly symmetric. No suspicious adnexal masses are seen. No inguinal lymphadenopathy is seen. No acute osseous abnormalities are identified. IMPRESSION: 1. Vague soft tissue inflammation along the ascending, descending and proximal sigmoid colon, concerning for acute infectious or inflammatory colitis. Trace fluid tracking along the paracolic gutters. 2. Underlying diverticulosis along the transverse, descending and sigmoid colon. 3. Scattered bilateral parapelvic renal cysts again noted. 4. Trace right-sided pleural fluid, with associated atelectasis. Electronically Signed   By: Roanna Raider M.D.   On: 07/10/2015 00:00   Dg Chest 2 View  07/19/2015  CLINICAL DATA:  GI bleed, weakness, nonsmoker, long-term use immunosuppressant medication, possible infiltrates EXAM: CHEST  2 VIEW COMPARISON:  None. FINDINGS: Cardiomediastinal silhouette is unremarkable. Mild hyperinflation. No acute infiltrate or pleural effusion. No pulmonary edema. Mild thoracic spine osteopenia. Degenerative changes mid and lower thoracic spine. IMPRESSION: No active disease. Mild hyperinflation. Degenerative changes mid and lower thoracic spine. Electronically Signed   By: Natasha Mead M.D.   On: 07/19/2015 08:16    Assessment/Plan  Physical deconditioning Will have her work with physical therapy and occupational therapy team to help with gait training and muscle strengthening exercises.fall precautions. Skin care. Encourage  to be out of bed.   Chron's colitis Continue her slow prednisone taper until 09/22/15. s/p remicaide in the hospital. Monitor cbg. Has follow up with GI team  Leg edema With her anasarca. Add ted hose for now and advsied to keep legs elevated at rest, monitor  Blood loss anemia S/p several units of PRBC transfusion in hospital. Start ferrous sulfate 325 mg bid. Check cbc 08/05/15  Hyperglycemia With her on prednisone. Monitor cbg. Start humalog 5 u bid for cbg > 250 No results found for: HGBA1C   RUE DVT Continue leiquis 5 mg bid and monitor  GI bleed With chron's colitis, continue protonix for prophylaxis  Protein calorie malnutrition With anasarca, RD to follow. Continue protein supplement  ckd stage 3 Monitor bmp  Insomnia continue trazodone 50 mg daily prn  Hypothyroidism continue levothyroxine 100 mcg daily  Hyperlipidemia continue simvastatin 20 mg daily   Goals of care: short term rehabilitation   Labs/tests ordered: cbc  Family/ staff Communication: reviewed care plan with patient and nursing supervisor    Oneal Grout, MD Internal Medicine [facility] [address] [place], [place] [address] Cell Phone (Monday-Friday 8 am - 5 pm): [phone] On Call: [phone] and follow prompts after 5 pm and on weekends Office Phone: [phone] Office Fax: [phone]

## 2015-08-04 ENCOUNTER — Telehealth: Payer: Self-pay | Admitting: *Deleted

## 2015-08-04 ENCOUNTER — Ambulatory Visit (HOSPITAL_COMMUNITY)
Admit: 2015-08-04 | Discharge: 2015-08-04 | Disposition: A | Discharge status: Home / Self Care | Payer: No Typology Code available for payment source | Source: Ambulatory Visit | Attending: Gastroenterology | Admitting: Gastroenterology

## 2015-08-04 ENCOUNTER — Encounter (HOSPITAL_COMMUNITY): Payer: Self-pay

## 2015-08-04 VITALS — BP 151/70 | HR 90 | Temp 98.1°F | Resp 20 | Ht 64.0 in | Wt 214.0 lb

## 2015-08-04 DIAGNOSIS — K50111 Crohn's disease of large intestine with rectal bleeding: Secondary | ICD-10-CM | POA: Diagnosis not present

## 2015-08-04 MED ORDER — SODIUM CHLORIDE 0.9 % IV SOLN
Freq: Once | INTRAVENOUS | Status: AC
  Administered 2015-08-04: 10:00:00 via INTRAVENOUS

## 2015-08-04 MED ORDER — ACETAMINOPHEN 325 MG PO TABS
650.0000 mg | ORAL_TABLET | Freq: Once | ORAL | Status: AC
  Administered 2015-08-04: 650 mg via ORAL
  Filled 2015-08-04: qty 2

## 2015-08-04 MED ORDER — DIPHENHYDRAMINE HCL 25 MG PO CAPS
50.0000 mg | ORAL_CAPSULE | Freq: Once | ORAL | Status: DC
Start: 2015-08-04 — End: 2015-08-05

## 2015-08-04 MED ORDER — SODIUM CHLORIDE 0.9 % IV SOLN
10.0000 mg/kg | Freq: Once | INTRAVENOUS | Status: AC
  Administered 2015-08-04: 1100 mg via INTRAVENOUS
  Filled 2015-08-04: qty 110

## 2015-08-04 NOTE — Progress Notes (Signed)
Pt arrived today at 0945 to Short Stay for her Remicade infusion.  Pt weighed in wheelchair and then wheelchair weighed and deducted from weight.  Pt weighed 214 lbs.  Pt was also reweighed to double check her weight.  There was a delay receiving Remicade because the pharmacist wanted to check with the MD about the dosage because pt's weight was 20 lbs different from weight on 6/15 and 6/20 (on those dates her weight was recorded as 234 lbs).  Pharmacist clarified with MD about the dosage and Remicade was started at 11:38 upon receiving it.  Apologized to pt for delay in infusion today, explained to pt the reason for the delay.  Pt voiced understanding and was pleasant and calm.

## 2015-08-04 NOTE — Telephone Encounter (Signed)
Received a call from Kenard Gower at [facility] pharmacy that patient has varying weights from yesterday to today. Today's weight is 20 lbs less that the weight yesterday at the nursing center. Spoke with Dr. Adela Lank and he wants her dosed at the higher weight. Drew aware.

## 2015-08-04 NOTE — Discharge Instructions (Signed)
Infliximab injection  What is this medicine?  INFLIXIMAB (in FLIX i mab) is used to treat Crohn's disease and ulcerative colitis. It is also used to treat ankylosing spondylitis, psoriasis, and some forms of arthritis.  This medicine may be used for other purposes; ask your health care provider or pharmacist if you have questions.  What should I tell my health care provider before I take this medicine?  They need to know if you have any of these conditions:  -diabetes  -exposure to tuberculosis  -heart failure  -hepatitis or liver disease  -immune system problems  -infection  -lung or breathing disease, like COPD  -multiple sclerosis  -current or past resident of [place]  -seizure disorder  -an unusual or allergic reaction to infliximab, mouse proteins, other medicines, foods, dyes, or preservatives  -pregnant or trying to get pregnant  -breast-feeding  How should I use this medicine?  This medicine is for injection into a vein. It is usually given by a health care professional in a hospital or clinic setting.  A special MedGuide will be given to you by the pharmacist with each prescription and refill. Be sure to read this information carefully each time.  Talk to your pediatrician regarding the use of this medicine in children. Special care may be needed.  Overdosage: If you think you have taken too much of this medicine contact a poison control center or emergency room at once.  NOTE: This medicine is only for you. Do not share this medicine with others.  What if I miss a dose?  It is important not to miss your dose. Call your doctor or health care professional if you are unable to keep an appointment.  What may interact with this medicine?  Do not take this medicine with any of the following medications:  -anakinra  -rilonacept  This medicine may also interact with the following medications:  -vaccines  This list may not describe all possible interactions. Give your health care provider  a list of all the medicines, herbs, non-prescription drugs, or dietary supplements you use. Also tell them if you smoke, drink alcohol, or use illegal drugs. Some items may interact with your medicine.  What should I watch for while using this medicine?  Visit your doctor or health care professional for regular checks on your progress.  If you get a cold or other infection while receiving this medicine, call your doctor or health care professional. Do not treat yourself. This medicine may decrease your body's ability to fight infections. Before beginning therapy, your doctor may do a test to see if you have been exposed to tuberculosis.  This medicine may make the symptoms of heart failure worse in some patients. If you notice symptoms such as increased shortness of breath or swelling of the ankles or legs, contact your health care provider right away.  If you are going to have surgery or dental work, tell your health care professional or dentist that you have received this medicine.  If you take this medicine for plaque psoriasis, stay out of the sun. If you cannot avoid being in the sun, wear protective clothing and use sunscreen. Do not use sun lamps or tanning beds/booths.  What side effects may I notice from receiving this medicine?  Side effects that you should report to your doctor or health care professional as soon as possible:  -allergic reactions like skin rash, itching or hives, swelling of the face, lips, or tongue  -chest pain  -  fever or chills, usually related to the infusion  -muscle or joint pain  -red, scaly patches or raised bumps on the skin  -signs of infection - fever or chills, cough, sore throat, pain or difficulty passing urine  -swollen lymph nodes in the neck, underarm, or groin areas  -unexplained weight loss  -unusual bleeding or bruising  -unusually weak or tired  -yellowing of the eyes or skin  Side effects that usually do not require medical attention (report to your doctor or health  care professional if they continue or are bothersome):  -headache  -heartburn or stomach pain  -nausea, vomiting  This list may not describe all possible side effects. Call your doctor for medical advice about side effects. You may report side effects to FDA at 1-800-FDA-1088.  Where should I keep my medicine?  This drug is given in a hospital or clinic and will not be stored at home.  NOTE: This sheet is a summary. It may not cover all possible information. If you have questions about this medicine, talk to your doctor, pharmacist, or health care provider.     © 2016, [employer]. (2007-09-18 10:26:02)

## 2015-08-05 LAB — CBC AND DIFFERENTIAL
HCT: 23 % — AB (ref 36–46)
Hemoglobin: 7.1 g/dL — AB (ref 12.0–16.0)
NEUTROS ABS: 3 /uL
Platelets: 387 10*3/uL (ref 150–399)
WBC: 5.9 10^3/mL

## 2015-08-06 ENCOUNTER — Non-Acute Institutional Stay (SKILLED_NURSING_FACILITY): Payer: Medicare Other | Admitting: Adult Health

## 2015-08-06 ENCOUNTER — Encounter: Payer: Self-pay | Admitting: Adult Health

## 2015-08-06 DIAGNOSIS — R600 Localized edema: Secondary | ICD-10-CM

## 2015-08-06 DIAGNOSIS — D500 Iron deficiency anemia secondary to blood loss (chronic): Secondary | ICD-10-CM | POA: Diagnosis not present

## 2015-08-06 NOTE — Progress Notes (Signed)
Patient ID: ANNALYCE LANPHER, female   DOB: [birth year], 70 y.o.   MRN: [id number]    DATE:  08/06/2015   MRN:  [id number]  BIRTHDAY: Mar 20, 1945  Facility:  Nursing Home Location:  [facility]  Nursing Home Room Number: 1205-P  LEVEL OF CARE:  SNF [phone])  Contact Information    Name Relation Home Work Mobile   Mccutchen,Sam Brother [phone]  [phone]   Gwen Pounds [phone]  [phone]       Code Status History    Date Active Date Inactive Code Status Order ID Comments User Context   07/09/2015  5:35 PM 07/28/2015  4:44 PM Full Code [id number]  Shon Hale, MD Inpatient       Chief Complaint  Patient presents with  . Acute Visit    Anemia, BLE edema    HISTORY OF PRESENT ILLNESS:  This is a 70 year old female who has been noted to have continue BLE edema 2+.  She is currently not on any diuretic. Latest hgb 7.1 and was recently started on FeSO4. No complaints of dizziness nor heatochezia.   She has been admitted to [facility] on 07/30/15 from [facility]. She was treated for inflammatory colitis, GI bleed and anemia. She was transfused 8 units of packed RBC. Biopsy done on colonoscopy on 5/23 showed inflammation and repeat colonoscopy done on 6/6 was consistent for Crohn's disease. Prednisone was started. She was diagnosed with right upper extremity DVT related to PICC for which she was started on heparin and switched back to IV steroids but GI bleed worsened. Quantiferpn gold indetermine-ppd placed and Remicade subsequently started with improvement in symptoms. No further GI bleed despite being on anticoagulation.  She has been admitted for a short-term rehabilitation.  PAST MEDICAL HISTORY:  Past Medical History  Diagnosis Date  . Thyroid disease   . Hypercholesterolemia   . Arthritis   . GERD (gastroesophageal reflux disease)   . Diverticulitis   . Renal insufficiency     stage 3 kidney disease  . Sleep apnea     occasionally  wears a c-pap  . Anxiety   . Osteoporosis   . Crohn's colitis (HCC) 07/09/2015    Colonoscopy 2017 biopsies:  Diagnosis 1. Surgical [P], random left colon - BENIGN COLONIC MUCOSA WITH MILD ACTIVE COLITIS, SEE COMMENT. 2. Surgical [P], left colon - BENIGN COLONIC MUCOSA WITH MILD ACTIVE COLITIS, SEE COMMENT. Microscopic Comment 1. and 2. Both specimens show similar features with fragments of benign appearing colonic mucosa generally displaying orderly crypt architecture but with  . Physical deconditioning   . Anemia due to GI blood loss   . Allergic rhinitis   . Acute lower GI bleeding   . Vitamin D deficiency   . HLD (hyperlipidemia)   . Protein calorie malnutrition (HCC)   . Insomnia      CURRENT MEDICATIONS: Reviewed  Patient's Medications  New Prescriptions   No medications on file  Previous Medications   ACETAMINOPHEN (TYLENOL) 325 MG TABLET    Take 2 tablets (650 mg total) by mouth every 6 (six) hours as needed for mild pain (or Fever >/= 101).   ALPRAZOLAM (XANAX) 0.25 MG TABLET    Take 0.25 mg by mouth daily.   AMINO ACIDS-PROTEIN HYDROLYS (FEEDING SUPPLEMENT, PRO-STAT SUGAR FREE 64,) LIQD    Take 30 mLs by mouth 2 (two) times daily between meals.   APIXABAN (ELIQUIS) 5 MG TABS TABLET    Take 1 tablet (5 mg total) by mouth  2 (two) times daily.   APIXABAN (ELIQUIS) 5 MG TABS TABLET    Take 2 tablets (10 mg total) by mouth 2 (two) times daily.   DIPHENOXYLATE-ATROPINE (LOMOTIL) 2.5-0.025 MG TABLET    Take 1 tablet by mouth 4 (four) times daily -  before meals and at bedtime.   FERROUS SULFATE 325 (65 FE) MG TABLET    Take 325 mg by mouth 2 (two) times daily with a meal.   FLUTICASONE (FLONASE) 50 MCG/ACT NASAL SPRAY    USE 2 SPRAYS EACH NOSTRIL DAILY AS NEEDED for allergies   INSULIN LISPRO (HUMALOG) 100 UNIT/ML INJECTION    Inject 5 Units into the skin as needed for high blood sugar. Give 5 units if CBG is >250   LEVOTHYROXINE (SYNTHROID, LEVOTHROID) 100 MCG TABLET    Take 100 mcg  by mouth daily before breakfast.   ONDANSETRON (ZOFRAN) 4 MG TABLET    Take 1 tablet (4 mg total) by mouth every 6 (six) hours as needed for nausea.   OXYCODONE (OXY-IR) 5 MG CAPSULE    Take 5 mg by mouth every 4 (four) hours as needed.   PANTOPRAZOLE (PROTONIX) 40 MG TABLET    Take 40 mg by mouth daily.   PREDNISONE (DELTASONE) 5 MG TABLET    Take 5 mg by mouth daily with breakfast. Take 7 tablets before breakfast to = 35 mg qd x7 days; take 6 tablets to = 30 mg qd x7 days, take 5 tablets to = 25 mg x7 days; take 4 tablets to = 20 mg qd x7 days; take 3 tablets to = 15 mg qd x7 days; take 2 tablets to = 10 mg qd x7 days; take 1 tablet qd to = 5 mg x7 days.   SIMVASTATIN (ZOCOR) 20 MG TABLET    Take 20 mg by mouth daily at 6 PM.    TRAZODONE (DESYREL) 50 MG TABLET    Take 1 tablet (50 mg total) by mouth at bedtime as needed for sleep.   VITAMIN D, CHOLECALCIFEROL, PO    Take 5,000 Units by mouth daily. D3  Modified Medications   No medications on file  Discontinued Medications   INFLIXIMAB (REMICADE IV)    Inject 10 mg/kg into the vein. Pt is in induction period.  Next appointment is July 19 then will be every 8 weeks after that.   PREDNISONE (DELTASONE) 20 MG TABLET    Take 2 tablets (40 mg total) by mouth daily before breakfast.     Allergies  Allergen Reactions  . Benadryl [Diphenhydramine Hcl] Other (See Comments)    Jittery  . Codeine Nausea And Vomiting    Pt is unable to recall all reactions to codeine  . Meloxicam Other (See Comments)    Due to stage III kidney disease  . Penicillins     Has patient had a PCN reaction causing immediate rash, facial/tongue/throat swelling, SOB or lightheadedness with hypotension: unknown Has patient had a PCN reaction causing severe rash involving mucus membranes or skin necrosis: unknown Has patient had a PCN reaction that required hospitalization: yes, drs visit Has patient had a PCN reaction occurring within the last 10 years: yes If all of the  above answers are "NO", then may proceed with Cephalosporin use.   Marland Kitchen Propoxyphene Hcl Nausea And Vomiting  . Tramadol Nausea Only    jittery     REVIEW OF SYSTEMS:  GENERAL: no change in appetite, no fatigue, no weight changes, no fever, chills or weakness EYES:  Denies change in vision, dry eyes, eye pain, itching or discharge EARS: Denies change in hearing, ringing in ears, or earache NOSE: Denies nasal congestion or epistaxis MOUTH and THROAT: Denies oral discomfort, gingival pain or bleeding, pain from teeth or hoarseness   RESPIRATORY: no cough, SOB, DOE, wheezing, hemoptysis CARDIAC: no chest pain or palpitations GI: no abdominal pain, diarrhea, constipation, heart burn, nausea or vomiting GU: Denies dysuria, frequency, hematuria, incontinence, or discharge PSYCHIATRIC: Denies feeling of depression or anxiety. No report of hallucinations, insomnia, paranoia, or agitation   PHYSICAL EXAMINATION  GENERAL APPEARANCE: Well nourished. In no acute distress. Obese SKIN:  Skin is warm and dry. HEAD: Normal in size and contour. No evidence of trauma EYES: Lids open and close normally. No blepharitis, entropion or ectropion. PERRL. Conjunctivae are clear and sclerae are white. Lenses are without opacity EARS: Pinnae are normal. Patient hears normal voice tunes of the examiner MOUTH and THROAT: Lips are without lesions. Oral mucosa is moist and without lesions. Tongue is normal in shape, size, and color and without lesions NECK: supple, trachea midline, no neck masses, no thyroid tenderness, no thyromegaly LYMPHATICS: no LAN in the neck, no supraclavicular LAN RESPIRATORY: breathing is even & unlabored, BS CTAB CARDIAC: RRR, no murmur,no extra heart sounds, BLE edema 2+ GI: abdomen soft, normal BS, no masses, no tenderness, no hepatomegaly, no splenomegaly EXTREMITIES:  Able to move 4 extremities PSYCHIATRIC: Alert and oriented X 3. Affect and behavior are  appropriate  LABS/RADIOLOGY: Labs reviewed: Basic Metabolic Panel:  Recent Labs  17/40/81 0421 07/12/15 0755 07/13/15 0545  07/25/15 0734 07/26/15 0515 07/28/15 0534 07/30/15  NA 135 135 133*  < > 137 138 136 146  K 5.3* 4.1 4.3  < > 3.8 3.5 4.0 4.8  CL 108 106 104  < > 102 102 99*  --   CO2 22 23 25   < > 30 30 31   --   GLUCOSE 172* 201* 167*  < > 98 110* 158*  --   BUN 16 17 19   < > 30* 26* 22* 25*  CREATININE 1.18* 1.23* 1.22*  < > 1.16* 1.07* 1.18* 1.3*  CALCIUM 7.3* 7.6* 7.6*  < > 7.4* 7.4* 7.6*  --   MG 2.1 2.0 1.9  --   --   --   --   --   PHOS 3.3 3.0 3.2  --   --   --   --   --   < > = values in this interval not displayed. Liver Function Tests:  Recent Labs  07/13/15 0545 07/21/15 0836 07/26/15 0515  AST 19 22 20   ALT 17 25 33  ALKPHOS 53 55 64  BILITOT 0.2* 0.4 0.6  PROT 4.5* 4.3* 4.3*  ALBUMIN 1.7* 1.7* 1.6*    Recent Labs  06/25/15 0730 07/09/15 1423  LIPASE 25 34   CBC:  Recent Labs  07/12/15 0755  07/26/15 0515 07/27/15 0523 07/28/15 0534 08/02/15 08/05/15  WBC 15.7*  < > 3.6* 4.0 3.9* 5.2 5.9  NEUTROABS 13.5*  --   --  2.7  --   --  3  HGB 10.7*  < > 8.7* 8.0* 8.4* 7.8* 7.1*  HCT 31.1*  < > 26.3* 24.7* 26.2* 25* 23*  MCV 86.4  < > 89.2 89.8 91.3  --   --   PLT 318  < > 100* 109* 115* 335 387  < > = values in this interval not displayed.  CBG:  Recent Labs  07/27/15  2150 07/28/15 0755 07/28/15 1215  GLUCAP 188* 146* 199*     Ct Abdomen Pelvis Wo Contrast  07/25/2015  CLINICAL DATA:  Left lower quadrant, diarrhea, and hematochezia. History of Crohn's disease. EXAM: CT ABDOMEN AND PELVIS WITHOUT CONTRAST TECHNIQUE: Multidetector CT imaging of the abdomen and pelvis was performed following the standard protocol without IV contrast. COMPARISON:  CT scans from Jul 09, 2015 and October 18, 2010 FINDINGS: The 5 mm nodule in the right middle lobe is stable since 2012. The lung bases are otherwise normal. No free air. A small amount of  free fluid is seen in the pelvis. Wall thickening and pericolonic stranding is again seen throughout most of the colon, involving the ascending, transverse, descending, and much of the sigmoid colon. The rectum is spared. The stomach and small bowel are normal. The liver, gallbladder, spleen, adrenal glands, and pancreas demonstrate no acute abnormalities. There is fatty deposition the pancreas which is stable. No aneurysm or adenopathy. Parapelvic cysts are seen in the kidneys. There are tiny and punctate stones in the kidneys with no evidence of obstruction. No ureterectasis or ureteral stones. The pelvis demonstrates no adenopathy or mass. Patient is status post hysterectomy. The bladder is decompressed. The visualized bones are stable. IMPRESSION: 1. Colitis from the ascending colon to the sigmoid colon, consistent with the patient's history of Crohn's disease. No significant change in the interval. Electronically Signed   By: Gerome Sam III M.D   On: 07/25/2015 11:52   Ct Abdomen Pelvis Wo Contrast  07/10/2015  CLINICAL DATA:  Acute onset of GI bleeding.  Initial encounter. EXAM: CT ABDOMEN AND PELVIS WITHOUT CONTRAST TECHNIQUE: Multidetector CT imaging of the abdomen and pelvis was performed following the standard protocol without IV contrast. COMPARISON:  CT of the abdomen and pelvis from 06/25/2015 FINDINGS: Trace right-sided pleural fluid is noted, with associated atelectasis. The liver and spleen are unremarkable in appearance. The gallbladder is within normal limits. The pancreas and adrenal glands are unremarkable. Scattered bilateral parapelvic renal cysts are seen. Minimal nonspecific perinephric stranding is noted bilaterally. There is no evidence of hydronephrosis. No renal or ureteral stones are identified. The small bowel is unremarkable in appearance. The stomach is within normal limits. No acute vascular abnormalities are seen. The patient is status post appendectomy. Vague soft tissue  inflammation is noted along the ascending, descending and proximal sigmoid colon, concerning for acute infectious or inflammatory colitis. Underlying diverticulosis is noted along the transverse, descending and sigmoid colon. Trace fluid is seen tracking along the paracolic gutters. The bladder is mildly distended and grossly unremarkable. The patient is status post hysterectomy. The ovaries are grossly symmetric. No suspicious adnexal masses are seen. No inguinal lymphadenopathy is seen. No acute osseous abnormalities are identified. IMPRESSION: 1. Vague soft tissue inflammation along the ascending, descending and proximal sigmoid colon, concerning for acute infectious or inflammatory colitis. Trace fluid tracking along the paracolic gutters. 2. Underlying diverticulosis along the transverse, descending and sigmoid colon. 3. Scattered bilateral parapelvic renal cysts again noted. 4. Trace right-sided pleural fluid, with associated atelectasis. Electronically Signed   By: Roanna Raider M.D.   On: 07/10/2015 00:00   Dg Chest 2 View  07/19/2015  CLINICAL DATA:  GI bleed, weakness, nonsmoker, long-term use immunosuppressant medication, possible infiltrates EXAM: CHEST  2 VIEW COMPARISON:  None. FINDINGS: Cardiomediastinal silhouette is unremarkable. Mild hyperinflation. No acute infiltrate or pleural effusion. No pulmonary edema. Mild thoracic spine osteopenia. Degenerative changes mid and lower thoracic spine. IMPRESSION: No active disease.  Mild hyperinflation. Degenerative changes mid and lower thoracic spine. Electronically Signed   By: Natasha Mead M.D.   On: 07/19/2015 08:16    ASSESSMENT/PLAN:  Anemia, acute blood loss - S/P transfusion of 8 units packed RBC; recently started on FeSO4 325 mg 1 tab PO BID; re-check CBC on 08/09/15 Lab Results  Component Value Date   WBC 5.9 08/05/2015   HGB 7.1* 08/05/2015   HCT 23* 08/05/2015   MCV 91.3 07/28/2015   PLT 387 08/05/2015    BLE edema - start Lasix 20  mg 1 tab PO daily; BMP on 08/12/15     Kenard Gower, NP [employer] [phone]

## 2015-08-09 LAB — CBC AND DIFFERENTIAL
HEMATOCRIT: 24 % — AB (ref 36–46)
Hemoglobin: 7.1 g/dL — AB (ref 12.0–16.0)
NEUTROS ABS: 6 /uL
Platelets: 392 10*3/uL (ref 150–399)
WBC: 8.9 10*3/mL

## 2015-08-10 ENCOUNTER — Telehealth: Payer: Self-pay | Admitting: *Deleted

## 2015-08-10 NOTE — Telephone Encounter (Signed)
[name] I am just seeing this lab work now. If her Hgb is 7.1 please have them recheck this ASAP, she may need a blood transfusion. Is there any way we can get ahold of her at the rehab facility to see how she is doing? Is she having rectal bleeding or ongoing diarrhea?            ----- Message -----     From: Daphine Deutscher, RN     Sent: 08/09/2015  1:47 PM      To: Ruffin Frederick, MD   [facility] and was give supervising nurses number [phone]. Left a message for her to call me.

## 2015-08-11 NOTE — Telephone Encounter (Signed)
Spoke wit Archie Patten one of the nurses at [facility] caring for patient. She states her Hgb yesterday was 7.1 again. States the patient looks good and is feeling good. She is doing rehab. She states she is not having rectal bleeding at this time and is not having ongoing diarrhea. She states the patient is scheduled for another CBC on 08/16/15.

## 2015-08-11 NOTE — Telephone Encounter (Signed)
Spoke with Archie Patten and patient is on Iron po already.

## 2015-08-11 NOTE — Telephone Encounter (Signed)
Okay good to hear it. If she has any worsening symptoms of fatigue, rectal bleeding, dyspnea, ect, in the interim she should let us know and they should have a low threshold to repeat CBC to see if she needs a transfusion. Is she taking oral iron? If not, would recommend 325mg  ferrous sulfate twice daily as tolerated. She should know that this will turn her stools black. Thanks

## 2015-08-12 ENCOUNTER — Other Ambulatory Visit (INDEPENDENT_AMBULATORY_CARE_PROVIDER_SITE_OTHER): Payer: Medicare Other

## 2015-08-12 ENCOUNTER — Ambulatory Visit (INDEPENDENT_AMBULATORY_CARE_PROVIDER_SITE_OTHER): Payer: Medicare Other | Admitting: Gastroenterology

## 2015-08-12 ENCOUNTER — Telehealth: Payer: Self-pay | Admitting: *Deleted

## 2015-08-12 ENCOUNTER — Other Ambulatory Visit: Payer: Self-pay | Admitting: *Deleted

## 2015-08-12 ENCOUNTER — Encounter: Payer: Self-pay | Admitting: Gastroenterology

## 2015-08-12 VITALS — BP 110/68 | HR 88 | Ht 64.0 in | Wt 208.5 lb

## 2015-08-12 DIAGNOSIS — D509 Iron deficiency anemia, unspecified: Secondary | ICD-10-CM

## 2015-08-12 DIAGNOSIS — D500 Iron deficiency anemia secondary to blood loss (chronic): Secondary | ICD-10-CM | POA: Diagnosis not present

## 2015-08-12 DIAGNOSIS — K50111 Crohn's disease of large intestine with rectal bleeding: Secondary | ICD-10-CM | POA: Diagnosis not present

## 2015-08-12 LAB — COMPREHENSIVE METABOLIC PANEL
ALBUMIN: 2.5 g/dL — AB (ref 3.5–5.2)
ALK PHOS: 63 U/L (ref 39–117)
ALT: 21 U/L (ref 0–35)
AST: 14 U/L (ref 0–37)
BILIRUBIN TOTAL: 0.2 mg/dL (ref 0.2–1.2)
BUN: 26 mg/dL — ABNORMAL HIGH (ref 6–23)
CALCIUM: 8.1 mg/dL — AB (ref 8.4–10.5)
CO2: 27 mEq/L (ref 19–32)
Chloride: 101 mEq/L (ref 96–112)
Creatinine, Ser: 1.02 mg/dL (ref 0.40–1.20)
GFR: 57.1 mL/min — AB (ref >60.00)
Glucose, Bld: 212 mg/dL — ABNORMAL HIGH (ref 70–99)
POTASSIUM: 3.7 meq/L (ref 3.5–5.1)
Sodium: 137 mEq/L (ref 135–145)
TOTAL PROTEIN: 5.6 g/dL — AB (ref 6.0–8.3)

## 2015-08-12 LAB — C-REACTIVE PROTEIN: CRP: 15.8 mg/dL (ref 0.5–20.0)

## 2015-08-12 LAB — HEMOGLOBIN

## 2015-08-12 NOTE — Patient Instructions (Signed)
Go to the basement for STAT labs today Please come back to our lobby and wait for results in case you will need to be sent to [facility] for transfusion

## 2015-08-12 NOTE — Telephone Encounter (Signed)
Per Dr. Adela Lank, need IV iron Feraheme x 2 doses.

## 2015-08-12 NOTE — Telephone Encounter (Signed)
Dee aware. Note on appointment date also. Scheduled IV iron at [facility] short stay on 09/06/15 at 9 AM and 09/13/15 at 10 AM. Left a message for patient to call back.

## 2015-08-12 NOTE — Telephone Encounter (Signed)
-----   Message from Ruffin Frederick, MD sent at 08/12/2015  1:20 PM EDT ----- [name], Forgot to mention, can you ensure they re-weigh Mrs. Muller prior to her next dose of Remicade, her dose will need to be adjusted as she continues to lose water weight. Thanks

## 2015-08-12 NOTE — Progress Notes (Signed)
HPI :  70 y/o female here for follow up for severe Crohn's colitis. She was recently admitted for worsening diarrhea, rectal bleeding, abdominal pain, and anemia. Colonoscopy was done ultimately showing a diagnosis of severe Crohns colitis. She was hospitalized for over 3 weeks, requiring IV steroids and was given Remicade dosed at 10mg /k. Her hospital course was complicated by development of upper extremity DVT, now on Eliquis. She also had severe anemia leading to multiple blood transfusions. She eventually improved on Remicade and steroids and was transitioned to rehab facility where she has been for a few weeks.   Overall, she is feeling better than previous. She reports having ongoing loose stool, using lomotil to help with this. She has averaged about 3 BMs per day on this regimen, frequency was previously much higher. She does not see any blood in the stool itself, just some scant red blood on the toilet paper at times. She is taking iron twice daily, turnes the stool black. She is eating well. She does not have any abdominal pains that bother her.  She tihnks overall she is feeling better since discharge. She is tolerating Remicade, last dose (her second dose) was on 6/21. She has had some LE edema which she is taking lasix form, which she thinks is improving. She is hoping to go home next week. She has lost a lot of water weight since being discharged.   She is on 30mg  prednisone and tapering by 5mg  per week, also on Remicade dosed at 10mg /kg.   Last Hgb 7.1 at [facility] rehab facility a few days ago and had been downtrending over the past 2 weeks.   Past Medical History  Diagnosis Date  . Thyroid disease   . Hypercholesterolemia   . Arthritis   . GERD (gastroesophageal reflux disease)   . Diverticulitis   . Renal insufficiency     stage 3 kidney disease  . Sleep apnea     occasionally wears a c-pap  . Anxiety   . Osteoporosis   . Physical deconditioning   . Anemia due to GI blood  loss   . Allergic rhinitis   . Acute lower GI bleeding   . Vitamin D deficiency   . HLD (hyperlipidemia)   . Protein calorie malnutrition (HCC)   . Insomnia   . Crohn's colitis (HCC) 07/09/2015  . Crohn's disease [facility])      Past Surgical History  Procedure Laterality Date  . Partial hysterectomy  1978    vaginal  . Tonsillectomy  1967  . Appendectomy  1957  . Colonoscopy    . Colonoscopy with propofol N/A 07/20/2015    Procedure: COLONOSCOPY WITH PROPOFOL;  Surgeon: Iva Boop, MD;  Location: [facility] ENDOSCOPY;  Service: Endoscopy;  Laterality: N/A;   Family History  Problem Relation Age of Onset  . Colon cancer Neg Hx   . Pancreatic cancer Neg Hx   . Stomach cancer Neg Hx   . Esophageal cancer Neg Hx   . Rectal cancer Neg Hx    Social History  Substance Use Topics  . Smoking status: Never Smoker   . Smokeless tobacco: Never Used  . Alcohol Use: No   Current Outpatient Prescriptions  Medication Sig Dispense Refill  . acetaminophen (TYLENOL) 325 MG tablet Take 2 tablets (650 mg total) by mouth every 6 (six) hours as needed for mild pain (or Fever >/= 101).    Marland Kitchen ALPRAZolam (XANAX) 0.25 MG tablet Take 0.25 mg by mouth daily.    Marland Kitchen  Amino Acids-Protein Hydrolys (FEEDING SUPPLEMENT, PRO-STAT SUGAR FREE 64,) LIQD Take 30 mLs by mouth 2 (two) times daily between meals. 900 mL 0  . apixaban (ELIQUIS) 5 MG TABS tablet Take 1 tablet (5 mg total) by mouth 2 (two) times daily. 60 tablet   . diphenoxylate-atropine (LOMOTIL) 2.5-0.025 MG tablet Take 1 tablet by mouth 4 (four) times daily -  before meals and at bedtime. 30 tablet 0  . ferrous sulfate 325 (65 FE) MG tablet Take 325 mg by mouth 2 (two) times daily with a meal.    . fluticasone (FLONASE) 50 MCG/ACT nasal spray USE 2 SPRAYS EACH NOSTRIL DAILY AS NEEDED for allergies  12  . InFLIXimab (REMICADE IV) Inject into the vein every 8 (eight) weeks.    Marland Kitchen levothyroxine (SYNTHROID, LEVOTHROID) 100 MCG tablet Take 100 mcg by mouth daily  before breakfast.    . ondansetron (ZOFRAN) 4 MG tablet Take 1 tablet (4 mg total) by mouth every 6 (six) hours as needed for nausea. 20 tablet 0  . pantoprazole (PROTONIX) 40 MG tablet Take 40 mg by mouth daily.    . predniSONE (DELTASONE) 5 MG tablet Take 5 mg by mouth daily with breakfast. Take 7 tablets before breakfast to = 35 mg qd x7 days; take 6 tablets to = 30 mg qd x7 days, take 5 tablets to = 25 mg x7 days; take 4 tablets to = 20 mg qd x7 days; take 3 tablets to = 15 mg qd x7 days; take 2 tablets to = 10 mg qd x7 days; take 1 tablet qd to = 5 mg x7 days.    . simvastatin (ZOCOR) 20 MG tablet Take 20 mg by mouth daily at 6 PM.     . VITAMIN D, CHOLECALCIFEROL, PO Take 5,000 Units by mouth daily. D3     No current facility-administered medications for this visit.   Allergies  Allergen Reactions  . Benadryl [Diphenhydramine Hcl] Other (See Comments)    Jittery  . Codeine Nausea And Vomiting    Pt is unable to recall all reactions to codeine  . Mobic [Meloxicam] Other (See Comments)    Due to stage III kidney disease  . Penicillins     Has patient had a PCN reaction causing immediate rash, facial/tongue/throat swelling, SOB or lightheadedness with hypotension: unknown Has patient had a PCN reaction causing severe rash involving mucus membranes or skin necrosis: unknown Has patient had a PCN reaction that required hospitalization: yes, drs visit Has patient had a PCN reaction occurring within the last 10 years: yes If all of the above answers are "NO", then may proceed with Cephalosporin use.   Marland Kitchen Propoxyphene Hcl Nausea And Vomiting  . Tramadol Nausea Only    jittery     Review of Systems: All systems reviewed and negative except where noted in HPI.   Lab Results  Component Value Date   WBC 5.9 08/05/2015   HGB 8.4 Repeated and verified X2.* 08/12/2015   HCT 23* 08/05/2015   MCV 91.3 07/28/2015   PLT 387 08/05/2015    Lab Results  Component Value Date   CREATININE  1.02 08/12/2015   BUN 26* 08/12/2015   NA 137 08/12/2015   K 3.7 08/12/2015   CL 101 08/12/2015   CO2 27 08/12/2015   Lab Results  Component Value Date   ALT 21 08/12/2015   AST 14 08/12/2015   ALKPHOS 63 08/12/2015   BILITOT 0.2 08/12/2015   Albumin 2.5 (previously in 1s)  CRP 15 (previously higher)   Physical Exam: BP 110/68 mmHg  Pulse 88  Ht 5\' 4"  (1.626 m)  Wt 208 lb 8 oz (94.575 kg)  BMI 35.77 kg/m2 Constitutional: Pleasant,well-developed, female in wheelchair HEENT: Normocephalic and atraumatic. Conjunctivae are normal. No scleral icterus. Neck supple.  Cardiovascular: Normal rate, regular rhythm.  Pulmonary/chest: Effort normal and breath sounds normal. No wheezing, rales or rhonchi. Abdominal: Soft, nondistended, nontender. Bowel sounds active throughout. There are no masses palpable. No hepatomegaly. Extremities: (+) 2-3 LE edema B Lymphadenopathy: No cervical adenopathy noted. Neurological: Alert and oriented to person place and time. Skin: Skin is warm and dry. No rashes noted. Psychiatric: Normal mood and affect. Behavior is normal.   ASSESSMENT AND PLAN: 70 y/o female with severe Crohn's colitis as outlined above, requiring prolonged hospitalization for IV steroids and Remicade dosed at 10mg /kg to induce remission. Fortunately she was able to avoid colectomy. Her hospital course was complicated by severe anemia requiring blood transfusion in the setting of anticoagulation for upper extremity DVT. Now continuing on high dose Remicade at 10mg /kg with slow prednisone taper. Overall symptoms improved but she has ongoing anemia. I repeated her labs today - her Hgb has risen from 7.1 at rehab to 8.4 today. Her albumin level has gone from 1s to 2.5, with improved nutritional status. CRP downtrending but not yet normal. Her LE edema is improving. In general, I do think she is making progress. I discussed long term management with the patient and her family today. I hope  high dose Remicade can maintain her and control symptoms. If she fails Remicade with recurrence of her severe colitis, we would need to think about colectomy.   At this time recommend the following: -continue Remicade at 10mg /kg, she will need to be re-weighed with dose adjustment prior to next dose as she has lost weight -continue prednisone taper, decrease by 5mg  per week until done -we will set her up for IV iron infusion to be done as soon as possible to help resolve her anemia and avoid transfusion, although I am please with how her labs look today. Continue oral iron until this is done -repeat Hgb in 10 days to ensure stable -she will contact us in the interim with any interval worsening or recurrence of symptoms as she tapers prednisone -she needs to establish with primary care to manage her anticoagulation and other medical problems, she will do this once discharged from her rehab  Ileene Patrick, MD [facility] Pager [phone]

## 2015-08-13 NOTE — Telephone Encounter (Signed)
Spoke with patient's brother and gave him appointments.

## 2015-08-18 ENCOUNTER — Encounter: Payer: Self-pay | Admitting: Adult Health

## 2015-08-18 ENCOUNTER — Non-Acute Institutional Stay (SKILLED_NURSING_FACILITY): Payer: Medicare Other | Admitting: Adult Health

## 2015-08-18 DIAGNOSIS — R5381 Other malaise: Secondary | ICD-10-CM

## 2015-08-18 DIAGNOSIS — J309 Allergic rhinitis, unspecified: Secondary | ICD-10-CM | POA: Diagnosis not present

## 2015-08-18 DIAGNOSIS — I808 Phlebitis and thrombophlebitis of other sites: Secondary | ICD-10-CM | POA: Diagnosis not present

## 2015-08-18 DIAGNOSIS — G4700 Insomnia, unspecified: Secondary | ICD-10-CM

## 2015-08-18 DIAGNOSIS — E039 Hypothyroidism, unspecified: Secondary | ICD-10-CM

## 2015-08-18 DIAGNOSIS — F411 Generalized anxiety disorder: Secondary | ICD-10-CM

## 2015-08-18 DIAGNOSIS — K922 Gastrointestinal hemorrhage, unspecified: Secondary | ICD-10-CM

## 2015-08-18 DIAGNOSIS — D500 Iron deficiency anemia secondary to blood loss (chronic): Secondary | ICD-10-CM | POA: Diagnosis not present

## 2015-08-18 DIAGNOSIS — I82A11 Acute embolism and thrombosis of right axillary vein: Secondary | ICD-10-CM

## 2015-08-18 DIAGNOSIS — E785 Hyperlipidemia, unspecified: Secondary | ICD-10-CM

## 2015-08-18 DIAGNOSIS — E43 Unspecified severe protein-calorie malnutrition: Secondary | ICD-10-CM

## 2015-08-18 DIAGNOSIS — K50111 Crohn's disease of large intestine with rectal bleeding: Secondary | ICD-10-CM

## 2015-08-18 DIAGNOSIS — R600 Localized edema: Secondary | ICD-10-CM

## 2015-08-18 DIAGNOSIS — E559 Vitamin D deficiency, unspecified: Secondary | ICD-10-CM

## 2015-08-18 NOTE — Progress Notes (Signed)
Patient ID: Jamie Wood, female   DOB: 06-Jan-1946, 70 y.o.   MRN: [id number]    DATE:  08/18/15  MRN:  [id number]  BIRTHDAY: Sep 02, 1945  Facility:  Nursing Home Location:  [facility]  Nursing Home Room Number: 1205-P  LEVEL OF CARE:  SNF [phone])  Contact Information    Name Relation Home Work Mobile   Cozine,Sam Brother [phone]  [phone]   Gwen Pounds [phone]  [phone]       Code Status History    Date Active Date Inactive Code Status Order ID Comments User Context   07/09/2015  5:35 PM 07/28/2015  4:44 PM Full Code [id number]  Shon Hale, MD Inpatient       Chief Complaint  Patient presents with  . Discharge Note    HISTORY OF PRESENT ILLNESS:  This is a 70 year old female who is for discharge home with Home health PT, OT and CNA. DME:  Rolling walker and 3-in-1 commode.  She has been admitted to [facility] on 07/30/15 from [facility]. She was treated for inflammatory colitis, GI bleed and anemia. She was transfused 8 units of packed RBC. Biopsy done on colonoscopy on 5/23 showed inflammation and repeat colonoscopy done on 6/6 was consistent for Crohn's disease. Prednisone was started. She was diagnosed with right upper extremity DVT related to PICC for which she was started on heparin and switched back to IV steroids but GI bleed worsened. Quantiferon gold indetermine-ppd placed and Remicade subsequently started with improvement in symptoms. No further GI bleed despite being on anticoagulation.  Patient was admitted to this facility for short-term rehabilitation after the patient's recent hospitalization.  Patient has completed SNF rehabilitation and therapy has cleared the patient for discharge.   PAST MEDICAL HISTORY:  Past Medical History:  Diagnosis Date  . Acute lower GI bleeding   . Allergic rhinitis   . Anemia due to GI blood loss   . Anxiety   . Arthritis   . Bilateral lower extremity edema   . Crohn's  colitis (HCC) 07/09/2015  . Crohn's disease (HCC)   . Diverticulitis   . DVT of right axillary vein, acute   . GERD (gastroesophageal reflux disease)   . HLD (hyperlipidemia)   . Hypercholesterolemia   . Insomnia   . Osteoporosis   . Physical deconditioning   . Physical deconditioning   . Protein calorie malnutrition (HCC)   . Renal insufficiency    stage 3 kidney disease  . Sleep apnea    occasionally wears a c-pap  . Thyroid disease   . Vitamin D deficiency      CURRENT MEDICATIONS: Reviewed  Patient's Medications  New Prescriptions   No medications on file  Previous Medications   ACETAMINOPHEN (TYLENOL) 325 MG TABLET    Take 2 tablets (650 mg total) by mouth every 6 (six) hours as needed for mild pain (or Fever >/= 101).   ALPRAZOLAM (XANAX) 0.25 MG TABLET    Take 0.25 mg by mouth daily.   AMINO ACIDS-PROTEIN HYDROLYS (FEEDING SUPPLEMENT, PRO-STAT SUGAR FREE 64,) LIQD    Take 30 mLs by mouth 2 (two) times daily between meals.   APIXABAN (ELIQUIS) 5 MG TABS TABLET    Take 1 tablet (5 mg total) by mouth 2 (two) times daily.   DIPHENOXYLATE-ATROPINE (LOMOTIL) 2.5-0.025 MG TABLET    Take 1 tablet by mouth 4 (four) times daily -  before meals and at bedtime.   FEEDING SUPPLEMENT, GLUCERNA SHAKE, (GLUCERNA SHAKE) LIQD  Take 237 mLs by mouth daily.   FERREX 150 150 MG CAPSULE    Take 150 mg by mouth daily.   FERUMOXYTOL (FERAHEME IV)    Inject into the vein once.   FLUTICASONE (FLONASE) 50 MCG/ACT NASAL SPRAY    USE 2 SPRAYS EACH NOSTRIL DAILY AS NEEDED for allergies   FUROSEMIDE (LASIX) 20 MG TABLET    Take 20 mg by mouth daily.   INFLIXIMAB (REMICADE IV)    Inject into the vein every 8 (eight) weeks.   LEVOTHYROXINE (SYNTHROID, LEVOTHROID) 100 MCG TABLET    Take 100 mcg by mouth daily before breakfast.   ONDANSETRON (ZOFRAN) 4 MG TABLET    Take 1 tablet (4 mg total) by mouth every 6 (six) hours as needed for nausea.   PANTOPRAZOLE (PROTONIX) 40 MG TABLET    Take 40 mg by mouth  daily.   POTASSIUM CHLORIDE SA (K-DUR,KLOR-CON) 20 MEQ TABLET    Take 20 mEq by mouth daily.   PREDNISONE (DELTASONE) 5 MG TABLET    Take 5 mg by mouth daily with breakfast. Take 5 tablets to = 25 mg x7 days; take 4 tablets to = 20 mg qd x7 days; take 3 tablets to = 15 mg qd x7 days; take 2 tablets to = 10 mg qd x7 days; take 1 tablet qd to = 5 mg x7 days.   SIMVASTATIN (ZOCOR) 20 MG TABLET    Take 20 mg by mouth daily with breakfast.    VITAMIN D, CHOLECALCIFEROL, PO    Take 5,000 Units by mouth daily. D3          FERROUS SULFATE 325 (65 FE) MG TABLET    Take 325 mg by mouth daily.        INSULIN LISPRO (HUMALOG) 100 UNIT/ML INJECTION    Inject 5 Units into the skin. Give 5 units if CBG is >250   OXYCODONE (OXY-IR) 5 MG CAPSULE    Take 5 mg by mouth every 4 (four) hours as needed for pain.   TRAZODONE (DESYREL) 50 MG TABLET    Take 50 mg by mouth at bedtime as needed for sleep.    Allergies  Allergen Reactions  . Benadryl [Diphenhydramine Hcl] Other (See Comments)    Jittery  . Codeine Nausea And Vomiting    Pt is unable to recall all reactions to codeine  . Mobic [Meloxicam] Other (See Comments)    Due to stage III kidney disease  . Penicillins     Has patient had a PCN reaction causing immediate rash, facial/tongue/throat swelling, SOB or lightheadedness with hypotension: unknown Has patient had a PCN reaction causing severe rash involving mucus membranes or skin necrosis: unknown Has patient had a PCN reaction that required hospitalization: yes, drs visit Has patient had a PCN reaction occurring within the last 10 years: yes If all of the above answers are "NO", then may proceed with Cephalosporin use.   Marland Kitchen Propoxyphene Hcl Nausea And Vomiting  . Tramadol Nausea Only    jittery     REVIEW OF SYSTEMS:  GENERAL: no change in appetite, no fatigue, no weight changes, no fever, chills or weakness EYES: Denies change in vision, dry eyes, eye pain, itching or discharge EARS:  Denies change in hearing, ringing in ears, or earache NOSE: Denies nasal congestion or epistaxis MOUTH and THROAT: Denies oral discomfort, gingival pain or bleeding, pain from teeth or hoarseness   RESPIRATORY: no cough, SOB, DOE, wheezing, hemoptysis CARDIAC: no chest pain or palpitations GI:  no abdominal pain, diarrhea, constipation, heart burn, nausea or vomiting GU: Denies dysuria, frequency, hematuria, incontinence, or discharge PSYCHIATRIC: Denies feeling of depression or anxiety. No report of hallucinations, insomnia, paranoia, or agitation   PHYSICAL EXAMINATION  GENERAL APPEARANCE: Well nourished. In no acute distress. Obese SKIN:  Skin is warm and dry. HEAD: Normal in size and contour. No evidence of trauma EYES: Lids open and close normally. No blepharitis, entropion or ectropion. PERRL. Conjunctivae are clear and sclerae are white. Lenses are without opacity EARS: Pinnae are normal. Patient hears normal voice tunes of the examiner MOUTH and THROAT: Lips are without lesions. Oral mucosa is moist and without lesions. Tongue is normal in shape, size, and color and without lesions NECK: supple, trachea midline, no neck masses, no thyroid tenderness, no thyromegaly LYMPHATICS: no LAN in the neck, no supraclavicular LAN RESPIRATORY: breathing is even & unlabored, BS CTAB CARDIAC: RRR, no murmur,no extra heart sounds, BLE edema 2+ GI: abdomen soft, normal BS, no masses, no tenderness, no hepatomegaly, no splenomegaly EXTREMITIES:  Able to move 4 extremities PSYCHIATRIC: Alert and oriented X 3. Affect and behavior are appropriate  LABS/RADIOLOGY: Labs reviewed: Basic Metabolic Panel:  Recent Labs  16/10/96 0421 07/12/15 0755 07/13/15 0545  07/28/15 0534 07/30/15 08/12/15 1144   NA 135 135 133*  < > 136 146 137   K 5.3* 4.1 4.3  < > 4.0 4.8 3.7   CL 108 106 104  < > 99*  --  101   CO2 < > 31  --  27   GLUCOSE 172* 201* 167*  < > 158*  --  212*   BUN <  > 22* 25* 26*   CREATININE 1.18* 1.23* 1.22*  < > 1.18* 1.3* 1.02   CALCIUM 7.3* 7.6* 7.6*  < > 7.6*  --  8.1*   MG 2.1 2.0 1.9  --   --   --   --    PHOS 3.3 3.0 3.2  --   --   --   --    < > = values in this interval not displayed. Liver Function Tests:  Recent Labs  07/26/15 0515 08/12/15 1144   AST 20 14   ALT 33 21   ALKPHOS 64 63   BILITOT 0.6 0.2   PROT 4.3* 5.6*   ALBUMIN 1.6* 2.5*     Recent Labs  06/25/15 0730 07/09/15 1423  LIPASE 25 34   CBC:  Recent Labs  07/27/15 0523 07/28/15 0534  08/05/15 08/09/15 08/12/15 1144   WBC 4.0 3.9*  < > 5.9 8.9  --    NEUTROABS 2.7  --   --  3 6  --    HGB 8.0* 8.4*  < > 7.1* 7.1* 8.4 Repeated and verified X2.*   HCT 24.7* 26.2*  < > 23* 24*  --    MCV 89.8 91.3  --   --   --   --    PLT 109* 115*  < > 387 392  --    < > = values in this interval not displayed. CBG:  Recent Labs  07/27/15 2150 07/28/15 0755 07/28/15 1215  GLUCAP 188* 146* 199*     Dg Chest 2 View  Result Date: 09/13/2015 CLINICAL DATA:  Pt initially scheduled for appointment to have iron infusion at short stay; fever noted with pt arrival to appointment. Pt denies fevers or pain at home. Dr. Adela Lank request evaluation. Diarrhea  yesterday. Pt reports "still having a little diarrhea." EXAM: CHEST  2 VIEW COMPARISON:  07/19/2015 FINDINGS: The heart size and mediastinal contours are within normal limits. Both lungs are clear. Mid thoracic spondylosis noted. IMPRESSION: No active cardiopulmonary disease. Electronically Signed   By: Norva Pavlov M.D.   On: 09/13/2015 12:56    ASSESSMENT/PLAN:  Physical deconditioning - for Home health PT, CNA and OT  Crohn's colitis - continue prednisone taper; Lomotil 2.5-0.0 25 mg 1 tab by mouth before meals and at bedtime; follow-up with Dr. Reeves Forth Armbruster, gastroenterology  Anemia, acute blood loss - S/P transfusion of 8 units packed RBC; hgb 8.4; continue FeSO4 325 mg BID  Insomnia - continue  trazodone 50 mg 1 tab by mouth daily at bedtime when necessary  Allergic rhinitis - continue Flonase 50 g take 2 sprays into each nostril daily when necessary  Hypothyroidism - continue levothyroxine 100 g 1 tab by mouth daily; check TSH  DVT, RUE - continue Eliquis 5 mg 1 tab by mouth twice a day  GI bleed - continue Protonix 40 mg 1 tab by mouth daily  Vitamin D deficiency - continue vitamin D3 5000 units 1 tab by mouth daily  Anxiety - mood is stable; continue Xanax 0.25 mg 1 tab by mouth daily  Hyperlipidemia - continue simvastatin 20 mg 1 tab by mouth daily Lab Results  Component Value Date   TRIG 273 (H) 07/12/2015    Protein calorie malnutrition, severe - albumin 1.6, low; continue Pro-Stat sugar-free 30 mL by mouth twice a day; referred to RD  BLE edema - recently started on Lasix 20 mg daily     I have filled out patient's discharge paperwork and written prescriptions.  Patient will receive home health PT, OT and CNA.  DME provided:  Rolling walker and 3-in-1 commode  Total discharge time: Greater than 30 minutes  Discharge time involved coordination of the discharge process with Child psychotherapist, nursing staff and therapy department. Medical justification for home health services/DME verified.     Kenard Gower, NP [employer] [phone]

## 2015-09-01 ENCOUNTER — Encounter (HOSPITAL_COMMUNITY): Payer: Self-pay

## 2015-09-01 ENCOUNTER — Telehealth: Payer: Self-pay | Admitting: Gastroenterology

## 2015-09-01 ENCOUNTER — Encounter (HOSPITAL_COMMUNITY)
Admit: 2015-09-01 | Discharge: 2015-09-01 | Disposition: A | Discharge status: Home / Self Care | Payer: Medicare Other | Source: Ambulatory Visit | Attending: Gastroenterology | Admitting: Gastroenterology

## 2015-09-01 VITALS — BP 105/59 | HR 82 | Temp 100.0°F | Resp 18 | Ht 64.0 in | Wt 196.4 lb

## 2015-09-01 DIAGNOSIS — K50111 Crohn's disease of large intestine with rectal bleeding: Secondary | ICD-10-CM | POA: Diagnosis not present

## 2015-09-01 DIAGNOSIS — R509 Fever, unspecified: Secondary | ICD-10-CM | POA: Insufficient documentation

## 2015-09-01 DIAGNOSIS — D509 Iron deficiency anemia, unspecified: Secondary | ICD-10-CM

## 2015-09-01 MED ORDER — ACETAMINOPHEN 325 MG PO TABS
650.0000 mg | ORAL_TABLET | Freq: Once | ORAL | Status: AC
  Administered 2015-09-01: 650 mg via ORAL
  Filled 2015-09-01: qty 2

## 2015-09-01 MED ORDER — SODIUM CHLORIDE 0.9 % IV SOLN
INTRAVENOUS | Status: DC
  Administered 2015-09-01: 13:00:00 via INTRAVENOUS

## 2015-09-01 MED ORDER — SODIUM CHLORIDE 0.9 % IV SOLN
10.0000 mg/kg | Freq: Once | INTRAVENOUS | Status: AC
  Administered 2015-09-01: 900 mg via INTRAVENOUS
  Filled 2015-09-01: qty 90

## 2015-09-01 MED ORDER — DIPHENHYDRAMINE HCL 25 MG PO CAPS: 50.0000 mg | ORAL_CAPSULE | Freq: Once | ORAL | Status: DC

## 2015-09-01 NOTE — Telephone Encounter (Signed)
[name] I received blood work for Jamie Wood drawn on 08/23/15:  CBC shows Hgb of 8.2 (stable), with WBC of 6.1, renal function and LFTs normal.  Can you check and see if she has had IV iron infusions and how many she has left?  We can recheck her CBC in 3 weeks if you can let her know. Thanks

## 2015-09-01 NOTE — Telephone Encounter (Signed)
Patient given results and recommendations. She is getting Remicade today. She is getting her IV iron on 7/24 and 7/31. Labs in EPIC.

## 2015-09-01 NOTE — Discharge Instructions (Signed)
Infliximab injection  What is this medicine?  INFLIXIMAB (in FLIX i mab) is used to treat Crohn's disease and ulcerative colitis. It is also used to treat ankylosing spondylitis, psoriasis, and some forms of arthritis.  This medicine may be used for other purposes; ask your health care provider or pharmacist if you have questions.  What should I tell my health care provider before I take this medicine?  They need to know if you have any of these conditions:  -diabetes  -exposure to tuberculosis  -heart failure  -hepatitis or liver disease  -immune system problems  -infection  -lung or breathing disease, like COPD  -multiple sclerosis  -current or past resident of [place]  -seizure disorder  -an unusual or allergic reaction to infliximab, mouse proteins, other medicines, foods, dyes, or preservatives  -pregnant or trying to get pregnant  -breast-feeding  How should I use this medicine?  This medicine is for injection into a vein. It is usually given by a health care professional in a hospital or clinic setting.  A special MedGuide will be given to you by the pharmacist with each prescription and refill. Be sure to read this information carefully each time.  Talk to your pediatrician regarding the use of this medicine in children. Special care may be needed.  Overdosage: If you think you have taken too much of this medicine contact a poison control center or emergency room at once.  NOTE: This medicine is only for you. Do not share this medicine with others.  What if I miss a dose?  It is important not to miss your dose. Call your doctor or health care professional if you are unable to keep an appointment.  What may interact with this medicine?  Do not take this medicine with any of the following medications:  -anakinra  -rilonacept  This medicine may also interact with the following medications:  -vaccines  This list may not describe all possible interactions. Give your health care provider  a list of all the medicines, herbs, non-prescription drugs, or dietary supplements you use. Also tell them if you smoke, drink alcohol, or use illegal drugs. Some items may interact with your medicine.  What should I watch for while using this medicine?  Visit your doctor or health care professional for regular checks on your progress.  If you get a cold or other infection while receiving this medicine, call your doctor or health care professional. Do not treat yourself. This medicine may decrease your body's ability to fight infections. Before beginning therapy, your doctor may do a test to see if you have been exposed to tuberculosis.  This medicine may make the symptoms of heart failure worse in some patients. If you notice symptoms such as increased shortness of breath or swelling of the ankles or legs, contact your health care provider right away.  If you are going to have surgery or dental work, tell your health care professional or dentist that you have received this medicine.  If you take this medicine for plaque psoriasis, stay out of the sun. If you cannot avoid being in the sun, wear protective clothing and use sunscreen. Do not use sun lamps or tanning beds/booths.  What side effects may I notice from receiving this medicine?  Side effects that you should report to your doctor or health care professional as soon as possible:  -allergic reactions like skin rash, itching or hives, swelling of the face, lips, or tongue  -chest pain  -  fever or chills, usually related to the infusion  -muscle or joint pain  -red, scaly patches or raised bumps on the skin  -signs of infection - fever or chills, cough, sore throat, pain or difficulty passing urine  -swollen lymph nodes in the neck, underarm, or groin areas  -unexplained weight loss  -unusual bleeding or bruising  -unusually weak or tired  -yellowing of the eyes or skin  Side effects that usually do not require medical attention (report to your doctor or health  care professional if they continue or are bothersome):  -headache  -heartburn or stomach pain  -nausea, vomiting  This list may not describe all possible side effects. Call your doctor for medical advice about side effects. You may report side effects to FDA at 1-800-FDA-1088.  Where should I keep my medicine?  This drug is given in a hospital or clinic and will not be stored at home.  NOTE: This sheet is a summary. It may not cover all possible information. If you have questions about this medicine, talk to your doctor, pharmacist, or health care provider.     © 2016, [employer]. (2007-09-18 10:26:02)

## 2015-09-02 ENCOUNTER — Telehealth: Payer: Self-pay | Admitting: Gastroenterology

## 2015-09-02 NOTE — Telephone Encounter (Signed)
Patient given recommendations. She will see her PCP for foot symptoms.

## 2015-09-02 NOTE — Telephone Encounter (Signed)
Unclear what to make of this over the phone, she needs to be evaluated. She is immunosuppressed so she needs to have vitals and temp checked, especially if she reports low BP. Need to ensure she is not having an infection, assess for fevers and likely needs blood work. I'm not sure what to make of her foot symptoms. Remicade can rarely be associated with neurologic symptoms but isolated foot numbness with swelling makes Remicade reaction for this issue unlikely. I agree that given the foot issue amongst the rest of her symptoms she should see her primary care first if possible, I am not in the clinic today. Thanks.

## 2015-09-02 NOTE — Telephone Encounter (Signed)
Spoke with Dr. Adela Lank. Patient should see her PCP for left foot swelling/numbness.May need labs also. She needs to have a thermometer to check her temperature also. Left a message for patient to call back.

## 2015-09-02 NOTE — Telephone Encounter (Signed)
Patient states she had a rough night. States when she left [facility] short stay she had a temperature of 100 after getting Remicade. Last night, she had shaking chills but did not take her temperature. She took Tylenol. She reports she has been having shaking chills around 5 PM daily but this was worse. She woke up during the night and her left foot was numb and she almost fell. Foot is still numb today. She states OT came to see her today and told her that her blood pressure is low. Please, advise.

## 2015-09-03 ENCOUNTER — Telehealth: Payer: Self-pay | Admitting: *Deleted

## 2015-09-03 NOTE — Telephone Encounter (Signed)
Patient states she did not see her PCP. She talked to her CNA and had her call her PCP about her foot/leg. She states the PCP did not think she needed to come in. Foot is not numb now and the swelling is less. She denies any fevers or symptoms of infection.

## 2015-09-03 NOTE — Telephone Encounter (Signed)
You mind reaching out to Jamie Wood to see if she had been evaluated? I'm not sure who her PCP is and if she even has one, the more I think about it as she complained about this issue previously.  If she doesn't have a PCP she can see and she is feeling poorly, do any of the APPs have openings today? I am fully booked in clinic but could try to squeeze her in somewhere otherwise. Thanks       Left a message for patient to call back.

## 2015-09-03 NOTE — Telephone Encounter (Signed)
Okay thanks for checking. She should monitor symptoms over the upcoming days and if any concerning recurrence of symptoms needs an assessment. Thanks

## 2015-09-06 ENCOUNTER — Encounter (HOSPITAL_COMMUNITY)
Admission: RE | Admit: 2015-09-06 | Discharge: 2015-09-06 | Disposition: A | Discharge status: Home / Self Care | Payer: Medicare Other | Source: Ambulatory Visit | Attending: Gastroenterology | Admitting: Gastroenterology

## 2015-09-06 ENCOUNTER — Encounter (HOSPITAL_COMMUNITY): Payer: Self-pay

## 2015-09-06 DIAGNOSIS — D509 Iron deficiency anemia, unspecified: Secondary | ICD-10-CM

## 2015-09-06 DIAGNOSIS — K50111 Crohn's disease of large intestine with rectal bleeding: Secondary | ICD-10-CM | POA: Diagnosis not present

## 2015-09-06 MED ORDER — SODIUM CHLORIDE 0.9 % IV SOLN
INTRAVENOUS | Status: DC
  Administered 2015-09-06: 09:00:00 via INTRAVENOUS

## 2015-09-06 MED ORDER — SODIUM CHLORIDE 0.9 % IV SOLN
510.0000 mg | INTRAVENOUS | Status: DC
  Administered 2015-09-06: 510 mg via INTRAVENOUS
  Filled 2015-09-06: qty 17

## 2015-09-06 NOTE — Progress Notes (Signed)
Patient in Short Stay for first of two feraheme infusions. Tolerated well. Second dose will be next Monday.

## 2015-09-06 NOTE — Discharge Instructions (Signed)
Feraheme °Ferumoxytol injection °What is this medicine? °FERUMOXYTOL is an iron complex. Iron is used to make healthy red blood cells, which carry oxygen and nutrients throughout the body. This medicine is used to treat iron deficiency anemia in people with chronic kidney disease. °This medicine may be used for other purposes; ask your health care provider or pharmacist if you have questions. °What should I tell my health care provider before I take this medicine? °They need to know if you have any of these conditions: °-anemia not caused by low iron levels °-high levels of iron in the blood °-magnetic resonance imaging (MRI) test scheduled °-an unusual or allergic reaction to iron, other medicines, foods, dyes, or preservatives °-pregnant or trying to get pregnant °-breast-feeding °How should I use this medicine? °This medicine is for injection into a vein. It is given by a health care professional in a hospital or clinic setting. °Talk to your pediatrician regarding the use of this medicine in children. Special care may be needed. °Overdosage: If you think you have taken too much of this medicine contact a poison control center or emergency room at once. °NOTE: This medicine is only for you. Do not share this medicine with others. °What if I miss a dose? °It is important not to miss your dose. Call your doctor or health care professional if you are unable to keep an appointment. °What may interact with this medicine? °This medicine may interact with the following medications: °-other iron products °This list may not describe all possible interactions. Give your health care provider a list of all the medicines, herbs, non-prescription drugs, or dietary supplements you use. Also tell them if you smoke, drink alcohol, or use illegal drugs. Some items may interact with your medicine. °What should I watch for while using this medicine? °Visit your doctor or healthcare professional regularly. Tell your doctor or  healthcare professional if your symptoms do not start to get better or if they get worse. You may need blood work done while you are taking this medicine. °You may need to follow a special diet. Talk to your doctor. Foods that contain iron include: whole grains/cereals, dried fruits, beans, or peas, leafy green vegetables, and organ meats (liver, kidney). °What side effects may I notice from receiving this medicine? °Side effects that you should report to your doctor or health care professional as soon as possible: °-allergic reactions like skin rash, itching or hives, swelling of the face, lips, or tongue °-breathing problems °-changes in blood pressure °-feeling faint or lightheaded, falls °-fever or chills °-flushing, sweating, or hot feelings °-swelling of the ankles or feet °Side effects that usually do not require medical attention (Report these to your doctor or health care professional if they continue or are bothersome.): °-diarrhea °-headache °-nausea, vomiting °-stomach pain °This list may not describe all possible side effects. Call your doctor for medical advice about side effects. You may report side effects to FDA at 1-800-FDA-1088. °Where should I keep my medicine? °This drug is given in a hospital or clinic and will not be stored at home. °NOTE: This sheet is a summary. It may not cover all possible information. If you have questions about this medicine, talk to your doctor, pharmacist, or health care provider. °  °© 2016, [employer]. (2011-09-15 15:23:36) ° °

## 2015-09-13 ENCOUNTER — Encounter (HOSPITAL_COMMUNITY): Payer: Self-pay | Admitting: Emergency Medicine

## 2015-09-13 ENCOUNTER — Emergency Department (HOSPITAL_COMMUNITY)
Admission: EM | Admit: 2015-09-13 | Discharge: 2015-09-13 | Disposition: A | Discharge status: Home / Self Care | Payer: Medicare Other | Attending: Emergency Medicine | Admitting: Emergency Medicine

## 2015-09-13 ENCOUNTER — Emergency Department (HOSPITAL_COMMUNITY): Payer: Medicare Other

## 2015-09-13 ENCOUNTER — Encounter (HOSPITAL_COMMUNITY)
Admission: RE | Admit: 2015-09-13 | Discharge: 2015-09-13 | Disposition: A | Discharge status: Home / Self Care | Payer: Medicare Other | Source: Ambulatory Visit | Attending: Gastroenterology | Admitting: Gastroenterology

## 2015-09-13 DIAGNOSIS — R509 Fever, unspecified: Secondary | ICD-10-CM | POA: Diagnosis present

## 2015-09-13 DIAGNOSIS — D72829 Elevated white blood cell count, unspecified: Secondary | ICD-10-CM | POA: Diagnosis not present

## 2015-09-13 DIAGNOSIS — K50111 Crohn's disease of large intestine with rectal bleeding: Secondary | ICD-10-CM | POA: Diagnosis not present

## 2015-09-13 DIAGNOSIS — D649 Anemia, unspecified: Secondary | ICD-10-CM

## 2015-09-13 LAB — COMPREHENSIVE METABOLIC PANEL
ALBUMIN: 2.2 g/dL — AB (ref 3.5–5.0)
ALK PHOS: 80 U/L (ref 38–126)
ALT: 26 U/L (ref 14–54)
AST: 19 U/L (ref 15–41)
Anion gap: 8 (ref 5–15)
BILIRUBIN TOTAL: 0.6 mg/dL (ref 0.3–1.2)
BUN: 20 mg/dL (ref 6–20)
CO2: 26 mmol/L (ref 22–32)
CREATININE: 1.19 mg/dL — AB (ref 0.44–1.00)
Calcium: 8.1 mg/dL — ABNORMAL LOW (ref 8.9–10.3)
Chloride: 101 mmol/L (ref 101–111)
GFR calc Af Amer: 53 mL/min — ABNORMAL LOW (ref >60)
GFR, EST NON AFRICAN AMERICAN: 45 mL/min — AB (ref >60)
GLUCOSE: 148 mg/dL — AB (ref 65–99)
POTASSIUM: 3.8 mmol/L (ref 3.5–5.1)
Sodium: 135 mmol/L (ref 135–145)
TOTAL PROTEIN: 5.6 g/dL — AB (ref 6.5–8.1)

## 2015-09-13 LAB — CBC WITH DIFFERENTIAL/PLATELET
BASOS PCT: 0 %
Basophils Absolute: 0 10*3/uL (ref 0.0–0.1)
EOS PCT: 0 %
Eosinophils Absolute: 0 10*3/uL (ref 0.0–0.7)
HEMATOCRIT: 27.8 % — AB (ref 36.0–46.0)
HEMOGLOBIN: 8.3 g/dL — AB (ref 12.0–15.0)
LYMPHS PCT: 6 %
Lymphs Abs: 1 10*3/uL (ref 0.7–4.0)
MCH: 28 pg (ref 26.0–34.0)
MCHC: 29.9 g/dL — ABNORMAL LOW (ref 30.0–36.0)
MCV: 93.9 fL (ref 78.0–100.0)
MONO ABS: 0.8 10*3/uL (ref 0.1–1.0)
MONOS PCT: 5 %
NEUTROS PCT: 89 %
Neutro Abs: 14.7 10*3/uL — ABNORMAL HIGH (ref 1.7–7.7)
Platelets: 417 10*3/uL — ABNORMAL HIGH (ref 150–400)
RBC: 2.96 MIL/uL — AB (ref 3.87–5.11)
RDW: 19.4 % — AB (ref 11.5–15.5)
WBC: 16.5 10*3/uL — AB (ref 4.0–10.5)

## 2015-09-13 LAB — URINALYSIS, ROUTINE W REFLEX MICROSCOPIC
Bilirubin Urine: NEGATIVE
GLUCOSE, UA: NEGATIVE mg/dL
HGB URINE DIPSTICK: NEGATIVE
KETONES UR: NEGATIVE mg/dL
LEUKOCYTES UA: NEGATIVE
Nitrite: NEGATIVE
PH: 5 (ref 5.0–8.0)
Protein, ur: NEGATIVE mg/dL
Specific Gravity, Urine: 1.008 (ref 1.005–1.030)

## 2015-09-13 LAB — GASTROINTESTINAL PANEL BY PCR, STOOL (REPLACES STOOL CULTURE)
Adenovirus F40/41: NOT DETECTED
Astrovirus: NOT DETECTED
CYCLOSPORA CAYETANENSIS: NOT DETECTED
Campylobacter species: NOT DETECTED
Cryptosporidium: NOT DETECTED
E. COLI O157: NOT DETECTED
ENTAMOEBA HISTOLYTICA: NOT DETECTED
ENTEROAGGREGATIVE E COLI (EAEC): NOT DETECTED
Enteropathogenic E coli (EPEC): NOT DETECTED
Enterotoxigenic E coli (ETEC): NOT DETECTED
GIARDIA LAMBLIA: NOT DETECTED
Norovirus GI/GII: NOT DETECTED
Plesimonas shigelloides: NOT DETECTED
Rotavirus A: NOT DETECTED
SALMONELLA SPECIES: NOT DETECTED
SAPOVIRUS (I, II, IV, AND V): NOT DETECTED
SHIGELLA/ENTEROINVASIVE E COLI (EIEC): NOT DETECTED
Shiga like toxin producing E coli (STEC): NOT DETECTED
VIBRIO CHOLERAE: NOT DETECTED
VIBRIO SPECIES: NOT DETECTED
Yersinia enterocolitica: NOT DETECTED

## 2015-09-13 LAB — I-STAT CG4 LACTIC ACID, ED: Lactic Acid, Venous: 1.02 mmol/L (ref 0.5–1.9)

## 2015-09-13 NOTE — ED Triage Notes (Signed)
Pt initially scheduled for appointment to have iron infusion at short stay; fever noted with pt arrival to appointment. Pt denies fevers or pain at home. Dr. Adela Lank request evaluation and recommends UA, CXR, and evaluation diarrhea yesterday. Pt reports "still having a little diarrhea."

## 2015-09-13 NOTE — ED Notes (Signed)
Patient transported to X-ray 

## 2015-09-13 NOTE — ED Notes (Signed)
PA at bedside.

## 2015-09-13 NOTE — Discharge Instructions (Signed)
Continue your regular home medications as prescribed. Your blood, urine, and stool cultures are pending.  If any findings are abnormal, we will contact you. Follow-up with your primary care doctor as well as your GI doctor. Return to the ED for new or worsening symptoms.

## 2015-09-13 NOTE — Progress Notes (Signed)
Pt presents with temp 101.3 oral and 100.3 axillary.  Denies any symptoms of infection otherwise (cough, UTI, etc).  Dr Adela Lank notified, infusion held and labs drawn per orders.

## 2015-09-13 NOTE — Progress Notes (Signed)
Notified Dr Adela Lank of lab results (elevated WBC, etc). Dr Adela Lank recommends Pt see primary care provider or ED phycisian today for UA, CXR and evaluation of diarrhea (from yesterday).

## 2015-09-13 NOTE — ED Provider Notes (Signed)
[facility]-EMERGENCY DEPT Provider Note   CSN: [id number] Arrival date & time: 09/13/15  1147  First Provider Contact:  First MD Initiated Contact with Patient 09/13/15 1241        History   Chief Complaint Chief Complaint  Patient presents with  . Fever    HPI Jamie Wood is a 70 y.o. female.  The history is provided by the patient and medical records.  Fever    70 year old female with history of Crohn's disease, anemia, history of DVT on Eliquis, osteoporosis, renal insufficiency, vitamin D deficiency, hyperlipidemia, presenting to the ED from her gastroenterologist office for further evaluation of fever. Patient was hospitalized earlier this year for Crohn's flare for approximately 3 weeks and ultimately required IV steroids, antibiotics, and Remicade injections. Since coming home she states she is overall been feeling well. Her gastroenterologist arranged iron infusions to avoid further blood transfusions. She went to her appointment today and they noticed she had a fever of 101F so was sent here for further evaluation. States overall she has been feeling well. She continues to have loose bowel movements but states this is not necessarily abnormal for her given her Crohn's disease. She denies any melena or hematochezia. She denies any nausea or vomiting. No abdominal pain.  She reports an occasional cough that is dry. No chest pain or shortness of breath. No sick contacts. No recent travel. Patient is currently still on prednisone.  Past Medical History:  Diagnosis Date  . Acute lower GI bleeding   . Allergic rhinitis   . Anemia due to GI blood loss   . Anxiety   . Arthritis   . Bilateral lower extremity edema   . Crohn's colitis (HCC) 07/09/2015  . Crohn's disease (HCC)   . Diverticulitis   . DVT of right axillary vein, acute   . GERD (gastroesophageal reflux disease)   . HLD (hyperlipidemia)   . Hypercholesterolemia   . Insomnia   . Osteoporosis   . Physical deconditioning     . Physical deconditioning   . Protein calorie malnutrition (HCC)   . Renal insufficiency    stage 3 kidney disease  . Sleep apnea    occasionally wears a c-pap  . Thyroid disease   . Vitamin D deficiency     Patient Active Problem List   Diagnosis Date Noted  . DVT of right axillary vein, acute 07/29/2015  . Protein-calorie malnutrition, severe (HCC) 07/29/2015  . Chronic kidney disease, stage 3 07/28/2015  . Hyponatremia 07/28/2015  . Dehydration   . Acute lower GI bleeding   . AKI (acute kidney injury) (HCC) 07/09/2015  . Anemia due to GI blood loss 07/09/2015  . Diarrhea in adult patient 07/09/2015  . Hypovolemia dehydration 07/09/2015  . Crohn's colitis (HCC) 07/09/2015  . Hypothyroidism 06/08/2009  . Vitamin D deficiency 06/08/2009  . Hyperlipidemia 06/08/2009  . Anxiety state 06/08/2009  . GERD 06/08/2009  . Diverticulosis of large intestine 06/08/2009  . DIVERTICULITIS, COLON 06/08/2009  . OSTEOARTHRITIS 06/08/2009  . COLONIC POLYPS, HYPERPLASTIC, HX OF 06/08/2009    Past Surgical History:  Procedure Laterality Date  . APPENDECTOMY  1957  . COLONOSCOPY    . COLONOSCOPY WITH PROPOFOL N/A 07/20/2015   Procedure: COLONOSCOPY WITH PROPOFOL;  Surgeon: Iva Boop, MD;  Location: [facility] ENDOSCOPY;  Service: Endoscopy;  Laterality: N/A;  . PARTIAL HYSTERECTOMY  1978   vaginal  . TONSILLECTOMY  1967    OB History    No data available  Home Medications    Prior to Admission medications   Medication Sig Start Date End Date Taking? Authorizing Provider  acetaminophen (TYLENOL) 325 MG tablet Take 2 tablets (650 mg total) by mouth every 6 (six) hours as needed for mild pain (or Fever >/= 101). 07/28/15   Calvert Cantor, MD  ALPRAZolam Prudy Feeler) 0.25 MG tablet Take 0.25 mg by mouth daily.    Historical Provider, MD  Amino Acids-Protein Hydrolys (FEEDING SUPPLEMENT, PRO-STAT SUGAR FREE 64,) LIQD Take 30 mLs by mouth 2 (two) times daily between meals. 07/28/15   Calvert Cantor, MD  apixaban (ELIQUIS) 5 MG TABS tablet Take 1 tablet (5 mg total) by mouth 2 (two) times daily. 07/31/15   Calvert Cantor, MD  diphenoxylate-atropine (LOMOTIL) 2.5-0.025 MG tablet Take 1 tablet by mouth 4 (four) times daily -  before meals and at bedtime. 07/28/15   Calvert Cantor, MD  ferrous sulfate 325 (65 FE) MG tablet Take 325 mg by mouth 2 (two) times daily with a meal.    Historical Provider, MD  fluticasone (FLONASE) 50 MCG/ACT nasal spray USE 2 SPRAYS EACH NOSTRIL DAILY AS NEEDED for allergies 06/01/15   Historical Provider, MD  furosemide (LASIX) 20 MG tablet Take 20 mg by mouth daily.    Historical Provider, MD  insulin lispro (HUMALOG) 100 UNIT/ML injection Inject 5 Units into the skin. Give 5 units if CBG is >250    Historical Provider, MD  levothyroxine (SYNTHROID, LEVOTHROID) 100 MCG tablet Take 100 mcg by mouth daily before breakfast.    Historical Provider, MD  ondansetron (ZOFRAN) 4 MG tablet Take 1 tablet (4 mg total) by mouth every 6 (six) hours as needed for nausea. 07/28/15   Calvert Cantor, MD  oxycodone (OXY-IR) 5 MG capsule Take 5 mg by mouth every 4 (four) hours as needed for pain.    Historical Provider, MD  pantoprazole (PROTONIX) 40 MG tablet Take 40 mg by mouth daily.    Historical Provider, MD  potassium chloride (K-DUR,KLOR-CON) 10 MEQ tablet Take 10 mEq by mouth once.    Historical Provider, MD  predniSONE (DELTASONE) 5 MG tablet Take 5 mg by mouth daily with breakfast. Take 5 tablets to = 25 mg x7 days; take 4 tablets to = 20 mg qd x7 days; take 3 tablets to = 15 mg qd x7 days; take 2 tablets to = 10 mg qd x7 days; take 1 tablet qd to = 5 mg x7 days.    Historical Provider, MD  simvastatin (ZOCOR) 20 MG tablet Take 20 mg by mouth daily at 6 PM.     Historical Provider, MD  traZODone (DESYREL) 50 MG tablet Take 50 mg by mouth at bedtime as needed for sleep.    Historical Provider, MD  VITAMIN D, CHOLECALCIFEROL, PO Take 5,000 Units by mouth daily. D3    Historical  Provider, MD    Family History Family History  Problem Relation Age of Onset  . Colon cancer Neg Hx   . Pancreatic cancer Neg Hx   . Stomach cancer Neg Hx   . Esophageal cancer Neg Hx   . Rectal cancer Neg Hx     Social History Social History  Substance Use Topics  . Smoking status: Never Smoker  . Smokeless tobacco: Never Used  . Alcohol use No     Allergies   Benadryl [diphenhydramine hcl]; Codeine; Mobic [meloxicam]; Penicillins; Propoxyphene hcl; and Tramadol   Review of Systems Review of Systems  Constitutional: Positive for fever.  Gastrointestinal: Positive  for diarrhea (loose stools).  All other systems reviewed and are negative.    Physical Exam Updated Vital Signs BP 111/70 (BP Location: Left Arm)   Pulse 78   Temp 99 F (37.2 C) (Oral)   Resp 16   Ht 5\' 3"  (1.6 m)   Wt 87.1 kg   SpO2 99%   BMI 34.01 kg/m   Physical Exam  Constitutional: She is oriented to person, place, and time. She appears well-developed and well-nourished.  Overall appears well, nontoxic  HENT:  Head: Normocephalic and atraumatic.  Mouth/Throat: Oropharynx is clear and moist.  Eyes: Conjunctivae and EOM are normal. Pupils are equal, round, and reactive to light.  Neck: Normal range of motion.  Cardiovascular: Normal rate, regular rhythm and normal heart sounds.   Pulmonary/Chest: Effort normal and breath sounds normal.  Abdominal: Soft. Bowel sounds are normal.  Soft, nontender, no peritoneal signs  Musculoskeletal: Normal range of motion.  Neurological: She is alert and oriented to person, place, and time.  Skin: Skin is warm and dry.  Psychiatric: She has a normal mood and affect.  Nursing note and vitals reviewed.    ED Treatments / Results  Labs (all labs ordered are listed, but only abnormal results are displayed) Labs Reviewed  URINALYSIS, ROUTINE W REFLEX MICROSCOPIC (NOT AT [facility]) - Abnormal; Notable for the following:       Result Value   APPearance CLOUDY  (*)    All other components within normal limits  GASTROINTESTINAL PANEL BY PCR, STOOL (REPLACES STOOL CULTURE)  CULTURE, BLOOD (ROUTINE X 2)  CULTURE, BLOOD (ROUTINE X 2)  URINE CULTURE  I-STAT CG4 LACTIC ACID, ED  I-STAT CG4 LACTIC ACID, ED    EKG  EKG Interpretation None       Radiology Dg Chest 2 View  Result Date: 09/13/2015 CLINICAL DATA:  Pt initially scheduled for appointment to have iron infusion at short stay; fever noted with pt arrival to appointment. Pt denies fevers or pain at home. Dr. Adela Lank request evaluation. Diarrhea yesterday. Pt reports "still having a little diarrhea." EXAM: CHEST  2 VIEW COMPARISON:  07/19/2015 FINDINGS: The heart size and mediastinal contours are within normal limits. Both lungs are clear. Mid thoracic spondylosis noted. IMPRESSION: No active cardiopulmonary disease. Electronically Signed   By: Norva Pavlov M.D.   On: 09/13/2015 12:56    Procedures Procedures (including critical care time)  Medications Ordered in ED Medications - No data to display   Initial Impression / Assessment and Plan / ED Course  I have reviewed the triage vital signs and the nursing notes.  Pertinent labs & imaging results that were available during my care of the patient were reviewed by me and considered in my medical decision making (see chart for details).  Clinical Course   70 year old female here from GI office for iron infusion with fever. She had basic labs performed at the office that revealed a leukocytosis of 16,000. Her hemoglobin was stable at 8.5 which is improved from prior. She denies any melena or hematochezia recently. She is afebrile and nontoxic in the ED here. States she has overall been feeling well. She is a mild cough without chest pain or shortness of breath. No urinary symptoms. No abdominal pain nausea or vomiting.  Chronic loose stools have continued.  Chest x-ray and urinalysis are negative for any signs of infection. She is on  Remicade, therefore blood and urine cultures obtained. Stool culture also pending.  Patient continues to appear well here in  the emergency department.  Patient's leukocytosis may be due to her continued use of prednisone for her Crohn's disease. Will have her follow-up with her primary care doctor as well as her GI physician.  Discussed plan with patient, he/she acknowledged understanding and agreed with plan of care.  Return precautions given for new or worsening symptoms.  Case discussed with attending physician, Dr. Patria Mane, who evaluated patient and agrees with assessment and plan of care.  Final Clinical Impressions(s) / ED Diagnoses   Final diagnoses:  Fever, unspecified fever cause  Anemia, unspecified anemia type  Leukocytosis    New Prescriptions New Prescriptions   No medications on file     Garlon Hatchet, Cordelia Poche 09/13/15 1523    Azalia Bilis, MD 09/13/15 [phone]

## 2015-09-14 ENCOUNTER — Telehealth: Payer: Self-pay | Admitting: Gastroenterology

## 2015-09-14 LAB — URINE CULTURE

## 2015-09-14 NOTE — Telephone Encounter (Signed)
Jamie Wood had a fever during IV iron infusion with a WBC of 16 and I had recommend she have an evaluation. I was in the hospital and could not see her so I recommended her primary care or ER. She went to the ER and had a CXR which seemed okay, but unclear what drove her fever.   Can you ask if she is feeling better? Is her colitis bothering her, or having diarrhea? If so, she needs stool study to rule out C diff. She should be off steroids or at a minimal dose at this time I believe, can you please confirm that, as the ER may have thought that was related to the WBC elevation. She should have repeat CBC in a few days to ensure stable. Thanks much

## 2015-09-14 NOTE — Telephone Encounter (Signed)
Spoke with [facility] and rescheduled patient for second IV iron on 09/20/15 at 1:00 PM. Left a message for patient to call back.

## 2015-09-14 NOTE — Telephone Encounter (Signed)
Spoke with patient and gave her rescheduled date.

## 2015-09-15 ENCOUNTER — Other Ambulatory Visit: Payer: Self-pay | Admitting: *Deleted

## 2015-09-15 DIAGNOSIS — K50119 Crohn's disease of large intestine with unspecified complications: Secondary | ICD-10-CM

## 2015-09-15 NOTE — Telephone Encounter (Signed)
Thanks for the followup!  

## 2015-09-15 NOTE — Telephone Encounter (Signed)
Spoke with patient and she does not have fever. She reports she is doing ok. She does have to get up at night d/t Lasix and some times has bowel movement. Stools are from hard to runny. She will have her CBC on 09/20/15 when she comes for 2nd IV iron at short stay.

## 2015-09-18 LAB — CULTURE, BLOOD (ROUTINE X 2): Culture: NO GROWTH

## 2015-09-20 ENCOUNTER — Encounter (HOSPITAL_COMMUNITY): Payer: Self-pay

## 2015-09-20 ENCOUNTER — Encounter (HOSPITAL_COMMUNITY)
Admission: RE | Admit: 2015-09-20 | Discharge: 2015-09-20 | Disposition: A | Discharge status: Home / Self Care | Payer: Medicare Other | Source: Ambulatory Visit | Attending: Gastroenterology | Admitting: Gastroenterology

## 2015-09-20 DIAGNOSIS — D509 Iron deficiency anemia, unspecified: Secondary | ICD-10-CM

## 2015-09-20 DIAGNOSIS — R509 Fever, unspecified: Secondary | ICD-10-CM | POA: Diagnosis not present

## 2015-09-20 DIAGNOSIS — K50111 Crohn's disease of large intestine with rectal bleeding: Secondary | ICD-10-CM | POA: Diagnosis not present

## 2015-09-20 LAB — CBC WITH DIFFERENTIAL/PLATELET
BASOS ABS: 0 10*3/uL (ref 0.0–0.1)
BASOS PCT: 0 %
EOS PCT: 0 %
Eosinophils Absolute: 0 10*3/uL (ref 0.0–0.7)
HEMATOCRIT: 28.8 % — AB (ref 36.0–46.0)
HEMOGLOBIN: 8.5 g/dL — AB (ref 12.0–15.0)
LYMPHS ABS: 0.9 10*3/uL (ref 0.7–4.0)
LYMPHS PCT: 18 %
MCH: 28.2 pg (ref 26.0–34.0)
MCHC: 29.5 g/dL — AB (ref 30.0–36.0)
MCV: 95.7 fL (ref 78.0–100.0)
MONO ABS: 0.5 10*3/uL (ref 0.1–1.0)
MONOS PCT: 10 %
NEUTROS ABS: 3.6 10*3/uL (ref 1.7–7.7)
NEUTROS PCT: 72 %
Platelets: 332 10*3/uL (ref 150–400)
RBC: 3.01 MIL/uL — ABNORMAL LOW (ref 3.87–5.11)
RDW: 19.1 % — ABNORMAL HIGH (ref 11.5–15.5)
WBC: 5 10*3/uL (ref 4.0–10.5)

## 2015-09-20 MED ORDER — SODIUM CHLORIDE 0.9 % IV SOLN
INTRAVENOUS | Status: DC
  Administered 2015-09-20: 13:00:00 via INTRAVENOUS

## 2015-09-20 MED ORDER — SODIUM CHLORIDE 0.9 % IV SOLN
510.0000 mg | INTRAVENOUS | Status: AC
  Administered 2015-09-20: 510 mg via INTRAVENOUS
  Filled 2015-09-20: qty 17

## 2015-09-20 NOTE — Progress Notes (Signed)
Cbc was drawn prior to feraheme infusion today.  On 7/31 WBC was 16.5.  Today 8/7 WBC is 5.0.  Informed pt of this.  Pt was pleased.  Proceeded with feraheme infusion.  This will be the 2nd feraheme infusion.

## 2015-09-21 ENCOUNTER — Other Ambulatory Visit: Payer: Self-pay

## 2015-09-21 DIAGNOSIS — D509 Iron deficiency anemia, unspecified: Secondary | ICD-10-CM

## 2015-09-29 ENCOUNTER — Emergency Department (HOSPITAL_COMMUNITY): Payer: Medicare Other

## 2015-09-29 ENCOUNTER — Emergency Department (HOSPITAL_COMMUNITY)
Admission: EM | Admit: 2015-09-29 | Discharge: 2015-09-29 | Disposition: A | Discharge status: Home / Self Care | Payer: Medicare Other | Source: Home / Self Care | Attending: Emergency Medicine | Admitting: Emergency Medicine

## 2015-09-29 ENCOUNTER — Encounter (HOSPITAL_COMMUNITY): Payer: Self-pay | Admitting: Emergency Medicine

## 2015-09-29 DIAGNOSIS — R531 Weakness: Secondary | ICD-10-CM

## 2015-09-29 DIAGNOSIS — E039 Hypothyroidism, unspecified: Secondary | ICD-10-CM | POA: Insufficient documentation

## 2015-09-29 DIAGNOSIS — N183 Chronic kidney disease, stage 3 (moderate): Secondary | ICD-10-CM

## 2015-09-29 DIAGNOSIS — E876 Hypokalemia: Secondary | ICD-10-CM

## 2015-09-29 DIAGNOSIS — A419 Sepsis, unspecified organism: Secondary | ICD-10-CM | POA: Diagnosis not present

## 2015-09-29 DIAGNOSIS — Z79891 Long term (current) use of opiate analgesic: Secondary | ICD-10-CM | POA: Insufficient documentation

## 2015-09-29 DIAGNOSIS — R109 Unspecified abdominal pain: Secondary | ICD-10-CM | POA: Diagnosis not present

## 2015-09-29 DIAGNOSIS — Z79899 Other long term (current) drug therapy: Secondary | ICD-10-CM | POA: Insufficient documentation

## 2015-09-29 LAB — CBC WITH DIFFERENTIAL/PLATELET
BASOS ABS: 0 10*3/uL (ref 0.0–0.1)
Basophils Relative: 0 %
EOS ABS: 0 10*3/uL (ref 0.0–0.7)
Eosinophils Relative: 0 %
HCT: 29.1 % — ABNORMAL LOW (ref 36.0–46.0)
Hemoglobin: 8.9 g/dL — ABNORMAL LOW (ref 12.0–15.0)
LYMPHS ABS: 0.5 10*3/uL — AB (ref 0.7–4.0)
Lymphocytes Relative: 5 %
MCH: 28.3 pg (ref 26.0–34.0)
MCHC: 30.6 g/dL (ref 30.0–36.0)
MCV: 92.7 fL (ref 78.0–100.0)
MONO ABS: 1.5 10*3/uL — AB (ref 0.1–1.0)
Monocytes Relative: 14 %
NEUTROS ABS: 8.7 10*3/uL — AB (ref 1.7–7.7)
Neutrophils Relative %: 81 %
PLATELETS: 369 10*3/uL (ref 150–400)
RBC: 3.14 MIL/uL — ABNORMAL LOW (ref 3.87–5.11)
RDW: 18.6 % — AB (ref 11.5–15.5)
WBC: 10.7 10*3/uL — ABNORMAL HIGH (ref 4.0–10.5)

## 2015-09-29 LAB — COMPREHENSIVE METABOLIC PANEL
ALK PHOS: 82 U/L (ref 38–126)
ALT: 14 U/L (ref 14–54)
AST: 13 U/L — AB (ref 15–41)
Albumin: 1.7 g/dL — ABNORMAL LOW (ref 3.5–5.0)
Anion gap: 9 (ref 5–15)
BILIRUBIN TOTAL: 0.4 mg/dL (ref 0.3–1.2)
BUN: 21 mg/dL — AB (ref 6–20)
CALCIUM: 7.4 mg/dL — AB (ref 8.9–10.3)
CO2: 24 mmol/L (ref 22–32)
CREATININE: 0.94 mg/dL (ref 0.44–1.00)
Chloride: 103 mmol/L (ref 101–111)
GFR calc Af Amer: >60 mL/min (ref >60)
Glucose, Bld: 101 mg/dL — ABNORMAL HIGH (ref 65–99)
POTASSIUM: 2.7 mmol/L — AB (ref 3.5–5.1)
Sodium: 136 mmol/L (ref 135–145)
TOTAL PROTEIN: 4.9 g/dL — AB (ref 6.5–8.1)

## 2015-09-29 LAB — URINALYSIS, ROUTINE W REFLEX MICROSCOPIC
GLUCOSE, UA: NEGATIVE mg/dL
Hgb urine dipstick: NEGATIVE
KETONES UR: NEGATIVE mg/dL
LEUKOCYTES UA: NEGATIVE
NITRITE: NEGATIVE
PROTEIN: NEGATIVE mg/dL
Specific Gravity, Urine: 1.024 (ref 1.005–1.030)
pH: 5.5 (ref 5.0–8.0)

## 2015-09-29 LAB — LIPASE, BLOOD: LIPASE: 15 U/L (ref 11–51)

## 2015-09-29 LAB — BRAIN NATRIURETIC PEPTIDE: B Natriuretic Peptide: 75.1 pg/mL (ref 0.0–100.0)

## 2015-09-29 MED ORDER — POTASSIUM CHLORIDE CRYS ER 20 MEQ PO TBCR
60.0000 meq | EXTENDED_RELEASE_TABLET | Freq: Once | ORAL | Status: AC
  Administered 2015-09-29: 60 meq via ORAL
  Filled 2015-09-29: qty 3

## 2015-09-29 NOTE — ED Provider Notes (Signed)
[facility]-EMERGENCY DEPT Provider Note   CSN: [id number] Arrival date & time: 09/29/15  0910     History   Chief Complaint Chief Complaint  Patient presents with  . shivering  . arm bruising eval    HPI Jamie Wood is a 70 y.o. female.  HPI Patient presents with concern of generalized weakness, fatigue, as well as a slowly healing cutaneous lesion on the left arm. Patient acknowledges history of multiple medical issues, including chronic edema for which she was previously taking Lasix. She stopped taking this about 3 weeks ago. Over the past few days she has had increasing weakness, and over the past day has had subjective fever and chills. No cough. No abdominal pain, no chest pain, no syncope, no vomiting or diarrhea. Patient had low dry.  Weeks ago, left anterior arm. Since that time she has an ecchymotic area, that has been essentially unchanged. No distal weakness, loss of sensation.  Past Medical History:  Diagnosis Date  . Acute lower GI bleeding   . Allergic rhinitis   . Anemia due to GI blood loss   . Anxiety   . Arthritis   . Bilateral lower extremity edema   . Crohn's colitis (HCC) 07/09/2015  . Crohn's disease (HCC)   . Diverticulitis   . DVT of right axillary vein, acute   . GERD (gastroesophageal reflux disease)   . HLD (hyperlipidemia)   . Hypercholesterolemia   . Insomnia   . Osteoporosis   . Physical deconditioning   . Physical deconditioning   . Protein calorie malnutrition (HCC)   . Renal insufficiency    stage 3 kidney disease  . Sleep apnea    occasionally wears a c-pap  . Thyroid disease   . Vitamin D deficiency     Patient Active Problem List   Diagnosis Date Noted  . DVT of right axillary vein, acute 07/29/2015  . Protein-calorie malnutrition, severe (HCC) 07/29/2015  . Chronic kidney disease, stage 3 07/28/2015  . Hyponatremia 07/28/2015  . Dehydration   . Acute lower GI bleeding   . AKI (acute kidney injury) (HCC) 07/09/2015  .  Anemia due to GI blood loss 07/09/2015  . Diarrhea in adult patient 07/09/2015  . Hypovolemia dehydration 07/09/2015  . Crohn's colitis (HCC) 07/09/2015  . Hypothyroidism 06/08/2009  . Vitamin D deficiency 06/08/2009  . Hyperlipidemia 06/08/2009  . Anxiety state 06/08/2009  . GERD 06/08/2009  . Diverticulosis of large intestine 06/08/2009  . DIVERTICULITIS, COLON 06/08/2009  . OSTEOARTHRITIS 06/08/2009  . COLONIC POLYPS, HYPERPLASTIC, HX OF 06/08/2009    Past Surgical History:  Procedure Laterality Date  . APPENDECTOMY  1957  . COLONOSCOPY    . COLONOSCOPY WITH PROPOFOL N/A 07/20/2015   Procedure: COLONOSCOPY WITH PROPOFOL;  Surgeon: Iva Boop, MD;  Location: [facility];  Service: Endoscopy;  Laterality: N/A;  . PARTIAL HYSTERECTOMY  1978   vaginal  . TONSILLECTOMY  1967    OB History    No data available       Home Medications    Prior to Admission medications   Medication Sig Start Date End Date Taking? Authorizing Provider  acetaminophen (TYLENOL) 325 MG tablet Take 2 tablets (650 mg total) by mouth every 6 (six) hours as needed for mild pain (or Fever >/= 101). 07/28/15  Yes Calvert Cantor, MD  ALPRAZolam Prudy Feeler) 0.25 MG tablet Take 0.25 mg by mouth daily.   Yes Historical Provider, MD  apixaban (ELIQUIS) 5 MG TABS tablet Take 1 tablet (5 mg  total) by mouth 2 (two) times daily. 07/31/15  Yes Calvert Cantor, MD  FERREX 150 150 MG capsule Take 150 mg by mouth daily. 08/23/15  Yes Historical Provider, MD  Ferumoxytol (FERAHEME IV) Inject into the vein once.   Yes Historical Provider, MD  fluticasone (FLONASE) 50 MCG/ACT nasal spray USE 2 SPRAYS EACH NOSTRIL DAILY AS NEEDED for allergies 06/01/15  Yes Historical Provider, MD  furosemide (LASIX) 20 MG tablet Take 20 mg by mouth daily.   Yes Historical Provider, MD  InFLIXimab (REMICADE IV) Inject into the vein every 8 (eight) weeks.   Yes Historical Provider, MD  levothyroxine (SYNTHROID, LEVOTHROID) 100 MCG tablet Take 100  mcg by mouth daily before breakfast.   Yes Historical Provider, MD  pantoprazole (PROTONIX) 40 MG tablet Take 40 mg by mouth daily.   Yes Historical Provider, MD  potassium chloride SA (K-DUR,KLOR-CON) 20 MEQ tablet Take 20 mEq by mouth daily.   Yes Historical Provider, MD  simvastatin (ZOCOR) 20 MG tablet Take 20 mg by mouth daily with breakfast.    Yes Historical Provider, MD  VITAMIN D, CHOLECALCIFEROL, PO Take 5,000 Units by mouth daily. D3   Yes Historical Provider, MD  Amino Acids-Protein Hydrolys (FEEDING SUPPLEMENT, PRO-STAT SUGAR FREE 64,) LIQD Take 30 mLs by mouth 2 (two) times daily between meals. Patient not taking: Reported on 09/13/2015 07/28/15   Calvert Cantor, MD  diphenoxylate-atropine (LOMOTIL) 2.5-0.025 MG tablet Take 1 tablet by mouth 4 (four) times daily -  before meals and at bedtime. Patient not taking: Reported on 09/13/2015 07/28/15   Calvert Cantor, MD  ondansetron (ZOFRAN) 4 MG tablet Take 1 tablet (4 mg total) by mouth every 6 (six) hours as needed for nausea. Patient not taking: Reported on 09/13/2015 07/28/15   Calvert Cantor, MD    Family History Family History  Problem Relation Age of Onset  . Colon cancer Neg Hx   . Pancreatic cancer Neg Hx   . Stomach cancer Neg Hx   . Esophageal cancer Neg Hx   . Rectal cancer Neg Hx     Social History Social History  Substance Use Topics  . Smoking status: Never Smoker  . Smokeless tobacco: Never Used  . Alcohol use No     Allergies   Benadryl [diphenhydramine hcl]; Codeine; Mobic [meloxicam]; Penicillins; Propoxyphene hcl; and Tramadol   Review of Systems Review of Systems  Constitutional:       Per HPI, otherwise negative  HENT:       Per HPI, otherwise negative  Respiratory:       Per HPI, otherwise negative  Cardiovascular:       Per HPI, otherwise negative  Gastrointestinal: Negative for vomiting.  Endocrine:       Negative aside from HPI  Genitourinary:       Neg aside from HPI   Musculoskeletal:        Per HPI, otherwise negative  Skin: Positive for color change.  Neurological: Positive for weakness. Negative for syncope.     Physical Exam Updated Vital Signs BP 105/63 (BP Location: Left Arm)   Pulse 101   Temp 100 F (37.8 C) (Oral)   Resp 18   Ht 5' 3.5" (1.613 m)   Wt 192 lb (87.1 kg)   SpO2 97%   BMI 33.48 kg/m   Physical Exam  Constitutional: She is oriented to person, place, and time. She appears well-developed and well-nourished. No distress.  HENT:  Head: Normocephalic and atraumatic.  Eyes: Conjunctivae and EOM  are normal.  Cardiovascular: Normal rate and regular rhythm.   Pulmonary/Chest: Effort normal and breath sounds normal. No stridor. No respiratory distress.  Abdominal: She exhibits no distension.  Musculoskeletal: She exhibits no edema.  Neurological: She is alert and oriented to person, place, and time. No cranial nerve deficit.  Skin: Skin is warm and dry.  On the left anterior distal forearm there is an ecchymotic area approximately 12 cm x 5 cm, nontender  Psychiatric: She has a normal mood and affect.  Nursing note and vitals reviewed.    ED Treatments / Results  Labs (all labs ordered are listed, but only abnormal results are displayed) Labs Reviewed  COMPREHENSIVE METABOLIC PANEL - Abnormal; Notable for the following:       Result Value   Potassium 2.7 (*)    Glucose, Bld 101 (*)    BUN 21 (*)    Calcium 7.4 (*)    Total Protein 4.9 (*)    Albumin 1.7 (*)    AST 13 (*)    All other components within normal limits  CBC WITH DIFFERENTIAL/PLATELET - Abnormal; Notable for the following:    WBC 10.7 (*)    RBC 3.14 (*)    Hemoglobin 8.9 (*)    HCT 29.1 (*)    RDW 18.6 (*)    Neutro Abs 8.7 (*)    Lymphs Abs 0.5 (*)    Monocytes Absolute 1.5 (*)    All other components within normal limits  URINALYSIS, ROUTINE W REFLEX MICROSCOPIC (NOT [facility]) - Abnormal; Notable for the following:    Color, Urine AMBER (*)    APPearance CLOUDY (*)      Bilirubin Urine SMALL (*)    All other components within normal limits  LIPASE, BLOOD  BRAIN NATRIURETIC PEPTIDE     Radiology Dg Chest 2 View  Result Date: 09/29/2015 CLINICAL DATA:  Acute onset of a curb increased weakness this morning. EXAM: CHEST  2 VIEW COMPARISON:  09/13/2015 FINDINGS: The heart size and mediastinal contours are within normal limits. Minimal linear atelectasis at the lung bases. Slight lumbar scoliosis. IMPRESSION: Minimal atelectasis at the lung bases.  No other acute abnormality. Electronically Signed   By: Francene BoyersJames  Maxwell M.D.   On: 09/29/2015 11:33    Procedures Procedures (including critical care time)  Medications Ordered in ED Medications  potassium chloride SA (K-DUR,KLOR-CON) CR tablet 60 mEq (not administered)     Initial Impression / Assessment and Plan / ED Course  I have reviewed the triage vital signs and the nursing notes.  Pertinent labs & imaging results that were available during my care of the patient were reviewed by me and considered in my medical decision making (see chart for details). Elderly female with multiple medical issues presents with generalized concern of weakness. Patient does have arm lesion, consistent with prior blood draw. No evidence for compartment syndrome, nor distal neurovascular compromise. Here, evaluation is reassuring, though there is some and demonstration for hypokalemia, and malnutrition. Patient received potassium repletion, had no decompensation, remained hemodynamically stable throughout. Patient was here with her brother with whom I discussed all findings, need to follow-up with primary care. Patient discharged in stable condition.   Gerhard Munchobert Kirstina Leinweber, MD 09/29/15 [phone]

## 2015-09-29 NOTE — ED Notes (Signed)
Attempted to get urine specimen. Patient had bowel movement in pan and same could not be used.

## 2015-09-29 NOTE — ED Notes (Signed)
Bed: [id number] Expected date:  Expected time:  Means of arrival:  Comments: 81F/shivering/arm bruise eval

## 2015-09-29 NOTE — ED Triage Notes (Signed)
Patient reports shivering just started this morning, all over her body. Patient denies a fever. Patient attributes the shivering from prednisone that she stopped taking 2 days ago. Was in hospital approximately 2 months ago. Patient reports an iron infusion 2 weeks ago and has a bruise on her left forearm that she would like evaluated. Marland Kitchen

## 2015-09-29 NOTE — ED Triage Notes (Signed)
Patient picked up from home by EMS, C\C of left arm bruising from previous IV x 1 month ago. Patient would like same evaluated. Patient also had a report of shivering; denies pain and chills.

## 2015-10-01 ENCOUNTER — Inpatient Hospital Stay (HOSPITAL_COMMUNITY)
Admission: EM | Admit: 2015-10-01 | Discharge: 2015-10-15 | DRG: 853 | Disposition: E | Payer: Medicare Other | Attending: Surgery | Admitting: Surgery

## 2015-10-01 ENCOUNTER — Emergency Department (HOSPITAL_COMMUNITY): Payer: Medicare Other

## 2015-10-01 ENCOUNTER — Encounter (HOSPITAL_COMMUNITY): Payer: Self-pay | Admitting: Emergency Medicine

## 2015-10-01 ENCOUNTER — Inpatient Hospital Stay (HOSPITAL_COMMUNITY): Payer: Medicare Other

## 2015-10-01 ENCOUNTER — Emergency Department (HOSPITAL_COMMUNITY): Payer: Medicare Other | Admitting: Anesthesiology

## 2015-10-01 ENCOUNTER — Encounter (HOSPITAL_COMMUNITY): Admission: EM | Disposition: E | Payer: Self-pay | Source: Home / Self Care

## 2015-10-01 DIAGNOSIS — F411 Generalized anxiety disorder: Secondary | ICD-10-CM | POA: Diagnosis present

## 2015-10-01 DIAGNOSIS — E7800 Pure hypercholesterolemia, unspecified: Secondary | ICD-10-CM | POA: Diagnosis present

## 2015-10-01 DIAGNOSIS — Z7951 Long term (current) use of inhaled steroids: Secondary | ICD-10-CM

## 2015-10-01 DIAGNOSIS — K5010 Crohn's disease of large intestine without complications: Secondary | ICD-10-CM | POA: Diagnosis present

## 2015-10-01 DIAGNOSIS — E872 Acidosis, unspecified: Secondary | ICD-10-CM

## 2015-10-01 DIAGNOSIS — M810 Age-related osteoporosis without current pathological fracture: Secondary | ICD-10-CM | POA: Diagnosis present

## 2015-10-01 DIAGNOSIS — Z885 Allergy status to narcotic agent status: Secondary | ICD-10-CM

## 2015-10-01 DIAGNOSIS — R109 Unspecified abdominal pain: Secondary | ICD-10-CM | POA: Diagnosis present

## 2015-10-01 DIAGNOSIS — Z7901 Long term (current) use of anticoagulants: Secondary | ICD-10-CM

## 2015-10-01 DIAGNOSIS — G4733 Obstructive sleep apnea (adult) (pediatric): Secondary | ICD-10-CM | POA: Diagnosis present

## 2015-10-01 DIAGNOSIS — I129 Hypertensive chronic kidney disease with stage 1 through stage 4 chronic kidney disease, or unspecified chronic kidney disease: Secondary | ICD-10-CM | POA: Diagnosis present

## 2015-10-01 DIAGNOSIS — Z90710 Acquired absence of both cervix and uterus: Secondary | ICD-10-CM

## 2015-10-01 DIAGNOSIS — F329 Major depressive disorder, single episode, unspecified: Secondary | ICD-10-CM | POA: Diagnosis present

## 2015-10-01 DIAGNOSIS — K219 Gastro-esophageal reflux disease without esophagitis: Secondary | ICD-10-CM | POA: Diagnosis present

## 2015-10-01 DIAGNOSIS — B9620 Unspecified Escherichia coli [E. coli] as the cause of diseases classified elsewhere: Secondary | ICD-10-CM | POA: Diagnosis present

## 2015-10-01 DIAGNOSIS — E039 Hypothyroidism, unspecified: Secondary | ICD-10-CM | POA: Diagnosis present

## 2015-10-01 DIAGNOSIS — R69 Illness, unspecified: Secondary | ICD-10-CM

## 2015-10-01 DIAGNOSIS — Z515 Encounter for palliative care: Secondary | ICD-10-CM | POA: Diagnosis present

## 2015-10-01 DIAGNOSIS — Z880 Allergy status to penicillin: Secondary | ICD-10-CM

## 2015-10-01 DIAGNOSIS — Z86010 Personal history of colonic polyps: Secondary | ICD-10-CM | POA: Diagnosis not present

## 2015-10-01 DIAGNOSIS — Z86718 Personal history of other venous thrombosis and embolism: Secondary | ICD-10-CM

## 2015-10-01 DIAGNOSIS — N179 Acute kidney failure, unspecified: Secondary | ICD-10-CM | POA: Diagnosis present

## 2015-10-01 DIAGNOSIS — Z888 Allergy status to other drugs, medicaments and biological substances status: Secondary | ICD-10-CM

## 2015-10-01 DIAGNOSIS — K631 Perforation of intestine (nontraumatic): Secondary | ICD-10-CM | POA: Diagnosis not present

## 2015-10-01 DIAGNOSIS — Z66 Do not resuscitate: Secondary | ICD-10-CM | POA: Diagnosis present

## 2015-10-01 DIAGNOSIS — A419 Sepsis, unspecified organism: Secondary | ICD-10-CM | POA: Diagnosis not present

## 2015-10-01 DIAGNOSIS — J9600 Acute respiratory failure, unspecified whether with hypoxia or hypercapnia: Secondary | ICD-10-CM

## 2015-10-01 DIAGNOSIS — N183 Chronic kidney disease, stage 3 (moderate): Secondary | ICD-10-CM | POA: Diagnosis present

## 2015-10-01 DIAGNOSIS — E861 Hypovolemia: Secondary | ICD-10-CM | POA: Diagnosis present

## 2015-10-01 DIAGNOSIS — D509 Iron deficiency anemia, unspecified: Secondary | ICD-10-CM | POA: Diagnosis present

## 2015-10-01 DIAGNOSIS — I248 Other forms of acute ischemic heart disease: Secondary | ICD-10-CM | POA: Diagnosis present

## 2015-10-01 DIAGNOSIS — Z79899 Other long term (current) drug therapy: Secondary | ICD-10-CM

## 2015-10-01 DIAGNOSIS — G9341 Metabolic encephalopathy: Secondary | ICD-10-CM | POA: Diagnosis present

## 2015-10-01 DIAGNOSIS — J9690 Respiratory failure, unspecified, unspecified whether with hypoxia or hypercapnia: Secondary | ICD-10-CM

## 2015-10-01 DIAGNOSIS — R198 Other specified symptoms and signs involving the digestive system and abdomen: Secondary | ICD-10-CM | POA: Diagnosis present

## 2015-10-01 DIAGNOSIS — J9601 Acute respiratory failure with hypoxia: Secondary | ICD-10-CM | POA: Diagnosis not present

## 2015-10-01 DIAGNOSIS — R6521 Severe sepsis with septic shock: Secondary | ICD-10-CM | POA: Diagnosis present

## 2015-10-01 DIAGNOSIS — Z886 Allergy status to analgesic agent status: Secondary | ICD-10-CM

## 2015-10-01 HISTORY — PX: LAPAROTOMY: SHX154

## 2015-10-01 HISTORY — PX: COLON RESECTION SIGMOID: SHX6737

## 2015-10-01 LAB — COMPREHENSIVE METABOLIC PANEL
ALT: 23 U/L (ref 14–54)
AST: 57 U/L — ABNORMAL HIGH (ref 15–41)
Albumin: 1.5 g/dL — ABNORMAL LOW (ref 3.5–5.0)
Alkaline Phosphatase: 146 U/L — ABNORMAL HIGH (ref 38–126)
Anion gap: 13 (ref 5–15)
BUN: 23 mg/dL — ABNORMAL HIGH (ref 6–20)
CO2: 20 mmol/L — ABNORMAL LOW (ref 22–32)
Calcium: 7.3 mg/dL — ABNORMAL LOW (ref 8.9–10.3)
Chloride: 101 mmol/L (ref 101–111)
Creatinine, Ser: 1.14 mg/dL — ABNORMAL HIGH (ref 0.44–1.00)
GFR calc Af Amer: 56 mL/min — ABNORMAL LOW (ref >60)
GFR calc non Af Amer: 48 mL/min — ABNORMAL LOW (ref >60)
Glucose, Bld: 101 mg/dL — ABNORMAL HIGH (ref 65–99)
Potassium: 4.5 mmol/L (ref 3.5–5.1)
Sodium: 134 mmol/L — ABNORMAL LOW (ref 135–145)
Total Bilirubin: 1.1 mg/dL (ref 0.3–1.2)
Total Protein: 4.6 g/dL — ABNORMAL LOW (ref 6.5–8.1)

## 2015-10-01 LAB — CBC WITH DIFFERENTIAL/PLATELET
Basophils Absolute: 0 10*3/uL (ref 0.0–0.1)
Basophils Relative: 0 %
Eosinophils Absolute: 0 10*3/uL (ref 0.0–0.7)
Eosinophils Relative: 0 %
HCT: 29.9 % — ABNORMAL LOW (ref 36.0–46.0)
Hemoglobin: 9.1 g/dL — ABNORMAL LOW (ref 12.0–15.0)
Lymphocytes Relative: 2 %
Lymphs Abs: 0.2 10*3/uL — ABNORMAL LOW (ref 0.7–4.0)
MCH: 28.8 pg (ref 26.0–34.0)
MCHC: 30.4 g/dL (ref 30.0–36.0)
MCV: 94.6 fL (ref 78.0–100.0)
Monocytes Absolute: 0.2 10*3/uL (ref 0.1–1.0)
Monocytes Relative: 2 %
Neutro Abs: 9.9 10*3/uL — ABNORMAL HIGH (ref 1.7–7.7)
Neutrophils Relative %: 96 %
Platelets: 299 10*3/uL (ref 150–400)
RBC: 3.16 MIL/uL — ABNORMAL LOW (ref 3.87–5.11)
RDW: 18.8 % — ABNORMAL HIGH (ref 11.5–15.5)
WBC: 10.3 10*3/uL (ref 4.0–10.5)

## 2015-10-01 LAB — URINALYSIS, ROUTINE W REFLEX MICROSCOPIC
BILIRUBIN URINE: NEGATIVE
Glucose, UA: NEGATIVE mg/dL
Glucose, UA: NEGATIVE mg/dL
Hgb urine dipstick: NEGATIVE
KETONES UR: NEGATIVE mg/dL
Ketones, ur: NEGATIVE mg/dL
Leukocytes, UA: NEGATIVE
Leukocytes, UA: NEGATIVE
NITRITE: NEGATIVE
Nitrite: NEGATIVE
Protein, ur: NEGATIVE mg/dL
Protein, ur: NEGATIVE mg/dL
Specific Gravity, Urine: 1.018 (ref 1.005–1.030)
Specific Gravity, Urine: 1.045 — ABNORMAL HIGH (ref 1.005–1.030)
pH: 5 (ref 5.0–8.0)
pH: 5 (ref 5.0–8.0)

## 2015-10-01 LAB — POCT I-STAT 7, (LYTES, BLD GAS, ICA,H+H)
Acid-base deficit: 10 mmol/L — ABNORMAL HIGH (ref 0.0–2.0)
BICARBONATE: 16.1 meq/L — AB (ref 20.0–24.0)
CALCIUM ION: 0.98 mmol/L — AB (ref 1.12–1.23)
HEMATOCRIT: 21 % — AB (ref 36.0–46.0)
Hemoglobin: 7.1 g/dL — ABNORMAL LOW (ref 12.0–15.0)
O2 Saturation: 98 %
POTASSIUM: 3.5 mmol/L (ref 3.5–5.1)
SODIUM: 138 mmol/L (ref 135–145)
TCO2: 17 mmol/L (ref 0–100)
pCO2 arterial: 37.3 mmHg (ref 35.0–45.0)
pH, Arterial: 7.243 — ABNORMAL LOW (ref 7.350–7.450)
pO2, Arterial: 114 mmHg — ABNORMAL HIGH (ref 80.0–100.0)

## 2015-10-01 LAB — BLOOD GAS, ARTERIAL
Acid-base deficit: 8.7 mmol/L — ABNORMAL HIGH (ref 0.0–2.0)
BICARBONATE: 16.9 meq/L — AB (ref 20.0–24.0)
DRAWN BY: 308601
FIO2: 100
LHR: 16 {breaths}/min
O2 Saturation: 98.4 %
PEEP: 5 cmH2O
Patient temperature: 98.6
TCO2: 16.2 mmol/L (ref 0–100)
VT: 500 mL
pCO2 arterial: 37.6 mmHg (ref 35.0–45.0)
pH, Arterial: 7.275 — ABNORMAL LOW (ref 7.350–7.450)
pO2, Arterial: 398 mmHg — ABNORMAL HIGH (ref 80.0–100.0)

## 2015-10-01 LAB — PROTIME-INR
INR: 3.02
PROTHROMBIN TIME: 32 s — AB (ref 11.4–15.2)

## 2015-10-01 LAB — I-STAT CG4 LACTIC ACID, ED
Lactic Acid, Venous: 5.14 mmol/L (ref 0.5–1.9)
Lactic Acid, Venous: 2.71 mmol/L (ref 0.5–1.9)

## 2015-10-01 LAB — URINE MICROSCOPIC-ADD ON

## 2015-10-01 LAB — TROPONIN I

## 2015-10-01 LAB — LACTIC ACID, PLASMA: Lactic Acid, Venous: 3.8 mmol/L (ref 0.5–1.9)

## 2015-10-01 LAB — PREPARE RBC (CROSSMATCH)

## 2015-10-01 LAB — APTT: aPTT: 44 seconds — ABNORMAL HIGH (ref 24–36)

## 2015-10-01 LAB — PROCALCITONIN: Procalcitonin: 57.15 ng/mL

## 2015-10-01 SURGERY — LAPAROTOMY, EXPLORATORY
Anesthesia: General

## 2015-10-01 MED ORDER — LEVOFLOXACIN IN D5W 750 MG/150ML IV SOLN
750.0000 mg | Freq: Once | INTRAVENOUS | Status: AC
  Administered 2015-10-01: 750 mg via INTRAVENOUS
  Filled 2015-10-01: qty 150

## 2015-10-01 MED ORDER — HYDROCORTISONE NA SUCCINATE PF 100 MG IJ SOLR
100.0000 mg | Freq: Once | INTRAMUSCULAR | Status: AC
  Administered 2015-10-01: 100 mg via INTRAVENOUS
  Filled 2015-10-01: qty 2

## 2015-10-01 MED ORDER — FENTANYL CITRATE (PF) 100 MCG/2ML IJ SOLN
INTRAMUSCULAR | Status: AC
  Filled 2015-10-01: qty 2

## 2015-10-01 MED ORDER — FENTANYL CITRATE (PF) 100 MCG/2ML IJ SOLN
50.0000 ug | Freq: Once | INTRAMUSCULAR | Status: AC
  Administered 2015-10-01: 50 ug via INTRAVENOUS

## 2015-10-01 MED ORDER — HEPARIN SODIUM (PORCINE) 5000 UNIT/ML IJ SOLN
5000.0000 [IU] | Freq: Three times a day (TID) | INTRAMUSCULAR | Status: DC
  Administered 2015-10-01 – 2015-10-02 (×4): 5000 [IU] via SUBCUTANEOUS
  Filled 2015-10-01 (×4): qty 1

## 2015-10-01 MED ORDER — HYDROCORTISONE NA SUCCINATE PF 100 MG IJ SOLR
100.0000 mg | Freq: Three times a day (TID) | INTRAMUSCULAR | Status: DC
  Administered 2015-10-01 – 2015-10-02 (×4): 100 mg via INTRAVENOUS
  Filled 2015-10-01 (×4): qty 2

## 2015-10-01 MED ORDER — ACETAMINOPHEN 325 MG PO TABS
650.0000 mg | ORAL_TABLET | Freq: Once | ORAL | Status: AC
  Administered 2015-10-01: 650 mg via ORAL
  Filled 2015-10-01: qty 2

## 2015-10-01 MED ORDER — LIDOCAINE HCL (CARDIAC) 20 MG/ML IV SOLN
INTRAVENOUS | Status: DC | PRN
  Administered 2015-10-01: 60 mg via INTRATRACHEAL

## 2015-10-01 MED ORDER — DEXTROSE 5 % IV SOLN
1.0000 g | Freq: Three times a day (TID) | INTRAVENOUS | Status: DC
  Administered 2015-10-01 – 2015-10-02 (×2): 1 g via INTRAVENOUS
  Filled 2015-10-01 (×3): qty 1

## 2015-10-01 MED ORDER — LACTATED RINGERS IV SOLN
INTRAVENOUS | Status: DC | PRN
  Administered 2015-10-01 (×2): via INTRAVENOUS

## 2015-10-01 MED ORDER — IOPAMIDOL (ISOVUE-300) INJECTION 61%
100.0000 mL | Freq: Once | INTRAVENOUS | Status: AC | PRN
  Administered 2015-10-01: 100 mL via INTRAVENOUS

## 2015-10-01 MED ORDER — ARTIFICIAL TEARS OP OINT
TOPICAL_OINTMENT | OPHTHALMIC | Status: AC
  Filled 2015-10-01: qty 3.5

## 2015-10-01 MED ORDER — MIDAZOLAM HCL 2 MG/2ML IJ SOLN
INTRAMUSCULAR | Status: AC
  Filled 2015-10-01: qty 2

## 2015-10-01 MED ORDER — FENTANYL CITRATE (PF) 250 MCG/5ML IJ SOLN
INTRAMUSCULAR | Status: DC | PRN
  Administered 2015-10-01 (×4): 25 ug via INTRAVENOUS

## 2015-10-01 MED ORDER — LIDOCAINE HCL (CARDIAC) 20 MG/ML IV SOLN
INTRAVENOUS | Status: AC
  Filled 2015-10-01: qty 5

## 2015-10-01 MED ORDER — SODIUM CHLORIDE 0.9 % IV BOLUS (SEPSIS)
1000.0000 mL | Freq: Once | INTRAVENOUS | Status: AC
  Administered 2015-10-01: 1000 mL via INTRAVENOUS

## 2015-10-01 MED ORDER — MIDAZOLAM HCL 5 MG/5ML IJ SOLN
INTRAMUSCULAR | Status: DC | PRN
  Administered 2015-10-01: 2 mg via INTRAVENOUS

## 2015-10-01 MED ORDER — LACTATED RINGERS IV SOLN
INTRAVENOUS | Status: DC | PRN
  Administered 2015-10-01 (×2): via INTRAVENOUS

## 2015-10-01 MED ORDER — FAMOTIDINE IN NACL 20-0.9 MG/50ML-% IV SOLN
20.0000 mg | Freq: Two times a day (BID) | INTRAVENOUS | Status: DC
Start: 2015-10-01 — End: 2015-10-03
  Administered 2015-10-01 – 2015-10-02 (×3): 20 mg via INTRAVENOUS
  Filled 2015-10-01 (×3): qty 50

## 2015-10-01 MED ORDER — VASOPRESSIN 20 UNIT/ML IV SOLN
0.0300 [IU]/min | INTRAVENOUS | Status: DC
  Filled 2015-10-01: qty 2

## 2015-10-01 MED ORDER — ROCURONIUM BROMIDE 100 MG/10ML IV SOLN
INTRAVENOUS | Status: DC | PRN
  Administered 2015-10-01 (×2): 10 mg via INTRAVENOUS
  Administered 2015-10-01: 30 mg via INTRAVENOUS

## 2015-10-01 MED ORDER — SUCCINYLCHOLINE CHLORIDE 20 MG/ML IJ SOLN
INTRAMUSCULAR | Status: DC | PRN
  Administered 2015-10-01: 100 mg via INTRAVENOUS

## 2015-10-01 MED ORDER — KCL IN DEXTROSE-NACL 20-5-0.45 MEQ/L-%-% IV SOLN
INTRAVENOUS | Status: DC
  Filled 2015-10-01: qty 1000

## 2015-10-01 MED ORDER — NOREPINEPHRINE BITARTRATE 1 MG/ML IV SOLN
5.0000 ug/min | INTRAVENOUS | Status: DC
  Administered 2015-10-02: 10 ug/min via INTRAVENOUS
  Administered 2015-10-02: 21 ug/min via INTRAVENOUS
  Filled 2015-10-01 (×4): qty 4

## 2015-10-01 MED ORDER — HYDROCORTISONE NA SUCCINATE PF 100 MG IJ SOLR: 50.0000 mg | Freq: Four times a day (QID) | INTRAMUSCULAR | Status: DC

## 2015-10-01 MED ORDER — SODIUM CHLORIDE 0.9 % IV SOLN
INTRAVENOUS | Status: DC | PRN
  Administered 2015-10-01: 15:00:00 via INTRAVENOUS

## 2015-10-01 MED ORDER — LEVOFLOXACIN IN D5W 750 MG/150ML IV SOLN
750.0000 mg | INTRAVENOUS | Status: DC
  Administered 2015-10-01: 750 mg via INTRAVENOUS
  Filled 2015-10-01: qty 150

## 2015-10-01 MED ORDER — LACTATED RINGERS IV SOLN
INTRAVENOUS | Status: DC | PRN
Start: 2015-10-01 — End: 2015-10-01
  Administered 2015-10-01 (×2): via INTRAVENOUS

## 2015-10-01 MED ORDER — PROPOFOL 10 MG/ML IV BOLUS
INTRAVENOUS | Status: DC | PRN
  Administered 2015-10-01: 40 mg via INTRAVENOUS

## 2015-10-01 MED ORDER — VANCOMYCIN HCL IN DEXTROSE 750-5 MG/150ML-% IV SOLN
750.0000 mg | Freq: Two times a day (BID) | INTRAVENOUS | Status: DC
  Administered 2015-10-02 – 2015-10-03 (×3): 750 mg via INTRAVENOUS
  Filled 2015-10-01 (×4): qty 150

## 2015-10-01 MED ORDER — ONDANSETRON HCL 4 MG/2ML IJ SOLN
4.0000 mg | Freq: Once | INTRAMUSCULAR | Status: AC
  Administered 2015-10-01: 4 mg via INTRAVENOUS
  Filled 2015-10-01: qty 2

## 2015-10-01 MED ORDER — NOREPINEPHRINE BITARTRATE 1 MG/ML IV SOLN
0.0000 ug/min | Freq: Once | INTRAVENOUS | Status: AC
  Administered 2015-10-01: 2 ug/min via INTRAVENOUS
  Filled 2015-10-01: qty 4

## 2015-10-01 MED ORDER — HYDROCORTISONE NA SUCCINATE PF 100 MG IJ SOLR: 100.0000 mg | Freq: Three times a day (TID) | INTRAMUSCULAR | Status: DC

## 2015-10-01 MED ORDER — SODIUM CHLORIDE 0.9 % IV SOLN
INTRAVENOUS | Status: DC
  Administered 2015-10-01 – 2015-10-02 (×2): via INTRAVENOUS

## 2015-10-01 MED ORDER — DIATRIZOATE MEGLUMINE & SODIUM 66-10 % PO SOLN
30.0000 mL | Freq: Once | ORAL | Status: AC
  Administered 2015-10-01: 30 mL via ORAL

## 2015-10-01 MED ORDER — NOREPINEPHRINE BITARTRATE 1 MG/ML IV SOLN
INTRAVENOUS | Status: DC | PRN
  Administered 2015-10-01: 2 ug/kg/min via INTRAVENOUS

## 2015-10-01 MED ORDER — SODIUM BICARBONATE 8.4 % IV SOLN
INTRAVENOUS | Status: AC
  Filled 2015-10-01: qty 50

## 2015-10-01 MED ORDER — MIDAZOLAM HCL 2 MG/2ML IJ SOLN
1.0000 mg | INTRAMUSCULAR | Status: DC | PRN
  Administered 2015-10-01 – 2015-10-02 (×2): 1 mg via INTRAVENOUS
  Filled 2015-10-01 (×2): qty 2

## 2015-10-01 MED ORDER — ROCURONIUM BROMIDE 100 MG/10ML IV SOLN
INTRAVENOUS | Status: AC
  Filled 2015-10-01: qty 1

## 2015-10-01 MED ORDER — MIDAZOLAM HCL 2 MG/2ML IJ SOLN: 1.0000 mg | INTRAMUSCULAR | Status: DC | PRN

## 2015-10-01 MED ORDER — ETOMIDATE 2 MG/ML IV SOLN
INTRAVENOUS | Status: DC | PRN
  Administered 2015-10-01: 20 mg via INTRAVENOUS

## 2015-10-01 MED ORDER — PROPOFOL 10 MG/ML IV BOLUS
INTRAVENOUS | Status: AC
  Filled 2015-10-01: qty 20

## 2015-10-01 MED ORDER — FAMOTIDINE IN NACL 20-0.9 MG/50ML-% IV SOLN: 20.0000 mg | INTRAVENOUS | Status: DC

## 2015-10-01 MED ORDER — DEXTROSE 5 % IV SOLN
2.0000 g | Freq: Once | INTRAVENOUS | Status: AC
  Administered 2015-10-01: 2 g via INTRAVENOUS
  Filled 2015-10-01: qty 2

## 2015-10-01 MED ORDER — LEVOTHYROXINE SODIUM 100 MCG IV SOLR
50.0000 ug | Freq: Every day | INTRAVENOUS | Status: DC
  Administered 2015-10-02: 50 ug via INTRAVENOUS
  Filled 2015-10-01: qty 5

## 2015-10-01 MED ORDER — 0.9 % SODIUM CHLORIDE (POUR BTL) OPTIME
TOPICAL | Status: DC | PRN
  Administered 2015-10-01: 3000 mL

## 2015-10-01 MED ORDER — VANCOMYCIN HCL IN DEXTROSE 1-5 GM/200ML-% IV SOLN
1000.0000 mg | Freq: Once | INTRAVENOUS | Status: AC
  Administered 2015-10-01: 1000 mg via INTRAVENOUS
  Filled 2015-10-01: qty 200

## 2015-10-01 MED ORDER — SODIUM CHLORIDE 0.9 % IV SOLN
25.0000 ug/h | INTRAVENOUS | Status: DC
  Administered 2015-10-01: 50 ug/h via INTRAVENOUS
  Administered 2015-10-02: 75 ug/h via INTRAVENOUS
  Filled 2015-10-01 (×3): qty 50

## 2015-10-01 MED ORDER — SODIUM BICARBONATE 8.4 % IV SOLN
INTRAVENOUS | Status: DC | PRN
  Administered 2015-10-01: 50 meq via INTRAVENOUS

## 2015-10-01 MED ORDER — FENTANYL BOLUS VIA INFUSION
25.0000 ug | INTRAVENOUS | Status: DC | PRN
  Administered 2015-10-02: 25 ug via INTRAVENOUS
  Filled 2015-10-01: qty 25

## 2015-10-01 MED ORDER — ETOMIDATE 2 MG/ML IV SOLN
INTRAVENOUS | Status: AC
  Filled 2015-10-01: qty 10

## 2015-10-01 SURGICAL SUPPLY — 43 items
APPLICATOR COTTON TIP 6IN STRL (MISCELLANEOUS) ×3 IMPLANT
BLADE EXTENDED COATED 6.5IN (ELECTRODE) ×3 IMPLANT
BLADE HEX COATED 2.75 (ELECTRODE) ×3 IMPLANT
BNDG GAUZE ELAST 4 BULKY (GAUZE/BANDAGES/DRESSINGS) ×3 IMPLANT
COVER MAYO STAND STRL (DRAPES) ×6 IMPLANT
COVER SURGICAL LIGHT HANDLE (MISCELLANEOUS) ×3 IMPLANT
DRAIN CHANNEL 19F RND (DRAIN) IMPLANT
DRAPE LAPAROSCOPIC ABDOMINAL (DRAPES) ×3 IMPLANT
DRAPE WARM FLUID 44X44 (DRAPE) ×3 IMPLANT
DRSG PAD ABDOMINAL 8X10 ST (GAUZE/BANDAGES/DRESSINGS) ×3 IMPLANT
ELECT REM PT RETURN 9FT ADLT (ELECTROSURGICAL) ×3
ELECTRODE REM PT RTRN 9FT ADLT (ELECTROSURGICAL) ×1 IMPLANT
EVACUATOR SILICONE 100CC (DRAIN) IMPLANT
GAUZE SPONGE 4X4 12PLY STRL (GAUZE/BANDAGES/DRESSINGS) ×3 IMPLANT
GLOVE BIOGEL M 8.0 STRL (GLOVE) ×9 IMPLANT
GOWN SPEC L4 XLG W/TWL (GOWN DISPOSABLE) ×3 IMPLANT
GOWN STRL REUS W/TWL XL LVL3 (GOWN DISPOSABLE) ×9 IMPLANT
HANDLE SUCTION POOLE (INSTRUMENTS) IMPLANT
KIT BASIN OR (CUSTOM PROCEDURE TRAY) ×3 IMPLANT
LIGASURE IMPACT 36 18CM CVD LR (INSTRUMENTS) ×3 IMPLANT
NS IRRIG 1000ML POUR BTL (IV SOLUTION) ×9 IMPLANT
PACK GENERAL/GYN (CUSTOM PROCEDURE TRAY) ×3 IMPLANT
SPONGE LAP 18X18 X RAY DECT (DISPOSABLE) ×9 IMPLANT
STAPLER CUT CVD 40MM GREEN (STAPLE) ×3 IMPLANT
STAPLER PROXIMATE 75MM BLUE (STAPLE) ×3 IMPLANT
STAPLER VISISTAT 35W (STAPLE) ×3 IMPLANT
SUCTION POOLE HANDLE (INSTRUMENTS)
SUT CHROMIC 3 0 SH 27 (SUTURE) ×6 IMPLANT
SUT PDS AB 1 CTX 36 (SUTURE) IMPLANT
SUT PDS AB 1 TP1 54 (SUTURE) ×6 IMPLANT
SUT SILK 2 0 (SUTURE) ×2
SUT SILK 2 0 SH CR/8 (SUTURE) ×3 IMPLANT
SUT SILK 2-0 18XBRD TIE 12 (SUTURE) ×1 IMPLANT
SUT SILK 3 0 (SUTURE) ×2
SUT SILK 3 0 SH CR/8 (SUTURE) ×3 IMPLANT
SUT SILK 3-0 18XBRD TIE 12 (SUTURE) ×1 IMPLANT
SUT VIC AB 3-0 SH 18 (SUTURE) IMPLANT
SUT VICRYL 2 0 18  UND BR (SUTURE) ×2
SUT VICRYL 2 0 18 UND BR (SUTURE) ×1 IMPLANT
TOWEL OR 17X26 10 PK STRL BLUE (TOWEL DISPOSABLE) ×3 IMPLANT
TOWEL OR NON WOVEN STRL DISP B (DISPOSABLE) ×3 IMPLANT
TRAY FOLEY W/METER SILVER 14FR (SET/KITS/TRAYS/PACK) ×3 IMPLANT
TRAY FOLEY W/METER SILVER 16FR (SET/KITS/TRAYS/PACK) IMPLANT

## 2015-10-01 NOTE — ED Triage Notes (Addendum)
Per EMS patient seen for same on Wednesday.  Reports uncontrollable chills.  Per EMS patient did not exhibit these symptoms en route but did at home.  Reports she was diagnosed with crohns 5-6 weeks ago.  Per EMS pt also reports lower abdominal pain as well.  Reports 1 episode of vomiting on scene.

## 2015-10-01 NOTE — Brief Op Note (Signed)
2015-06-12  6:14 PM  PATIENT:  Jamie Wood  70 y.o. female  PRE-OPERATIVE DIAGNOSIS:  free air  POST-OPERATIVE DIAGNOSIS:  free air  PROCEDURE:  Procedure(s): EXPLORATORY LAPAROTOMY (N/A) COLON RESECTION SIGMOID (N/A)Hepatic flexure down to the rectum with end ostomy and Hartmann pouch  SURGEON:  Surgeon(s) and Role:    * Luretha MurphyMatthew Spyridon Hornstein, MD - Primary  PHYSICIAN ASSISTANT:   ASSISTANTS: Ovidio Kinavid Newman, MD,    ANESTHESIA:   general  EBL:  Total I/O In: 4000 [I.V.:4000] Out: 850 [Urine:450; Blood:400]  BLOOD ADMINISTERED:none  DRAINS: none   LOCAL MEDICATIONS USED:  NONE  SPECIMEN:  Source of Specimen:  colong  DISPOSITION OF SPECIMEN:  PATHOLOGY  COUNTS:  YES  TOURNIQUET:  * No tourniquets in log *  DICTATION: .Other Dictation: Dictation Number [id number]  PLAN OF CARE: Admit to inpatient   PATIENT DISPOSITION:  ICU - intubated and critically ill.   Delay start of Pharmacological VTE agent (>24hrs) due to surgical blood loss or risk of bleeding: yes

## 2015-10-01 NOTE — Consult Note (Signed)
PULMONARY / CRITICAL CARE MEDICINE   Name: Jamie Wood MRN: [id number] DOB: 01/31/1946    ADMISSION DATE:  October 03, 2015 CONSULTATION DATE: 10/08/2015  REFERRING MD:  Dr. Daphine Deutscher CCS  CHIEF COMPLAINT:  Abdominal pain  HISTORY OF PRESENT ILLNESS:   70 yo female with with lower abdominal pain, chills, nausea, vomiting.  She was dx with inflammatory bowel disease (Crohn's disease) recently and was on remicade.  There was concern for abdominal sepsis and she was started on IV Abx.  CT abdomen showed free air.  She was seen by surgery and taken to OR for emergent laparotomy.  She had sigmoid colon resection with end ostomy and Hartmann pouch.  She remained on the vent post op.  During recent hospitalization for GI bleed she required 8 units PRBC, developed DVT from PICC line >> on apixaban.  She was getting IV iron infusions by GI as outpt.  PAST MEDICAL HISTORY :  She  has a past medical history of Acute lower GI bleeding; Allergic rhinitis; Anemia due to GI blood loss; Anxiety; Arthritis; Bilateral lower extremity edema; Crohn's colitis (HCC) (07/09/2015); Crohn's disease (HCC); Diverticulitis; DVT of right axillary vein, acute; GERD (gastroesophageal reflux disease); HLD (hyperlipidemia); Hypercholesterolemia; Insomnia; Osteoporosis; Physical deconditioning; Physical deconditioning; Protein calorie malnutrition (HCC); Renal insufficiency; Sleep apnea; Thyroid disease; and Vitamin D deficiency.  PAST SURGICAL HISTORY: She  has a past surgical history that includes Partial hysterectomy (1978); Tonsillectomy (1967); Appendectomy (1957); Colonoscopy; and Colonoscopy with propofol (N/A, 07/20/2015).  Allergies  Allergen Reactions  . Benadryl [Diphenhydramine Hcl] Other (See Comments)    Jittery  . Codeine Nausea And Vomiting    Pt is unable to recall all reactions to codeine  . Mobic [Meloxicam] Other (See Comments)    Due to stage III kidney disease  . Penicillins     Has patient had a PCN reaction  causing immediate rash, facial/tongue/throat swelling, SOB or lightheadedness with hypotension: unknown Has patient had a PCN reaction causing severe rash involving mucus membranes or skin necrosis: unknown Has patient had a PCN reaction that required hospitalization: yes, drs visit Has patient had a PCN reaction occurring within the last 10 years: yes If all of the above answers are "NO", then may proceed with Cephalosporin use.   Marland Kitchen Propoxyphene Hcl Nausea And Vomiting  . Tramadol Nausea Only    jittery    No current facility-administered medications on file prior to encounter.    Current Outpatient Prescriptions on File Prior to Encounter  Medication Sig  . acetaminophen (TYLENOL) 325 MG tablet Take 2 tablets (650 mg total) by mouth every 6 (six) hours as needed for mild pain (or Fever >/= 101).  Marland Kitchen ALPRAZolam (XANAX) 0.25 MG tablet Take 0.25 mg by mouth daily. Also takes at bedtime if needed.  Marland Kitchen apixaban (ELIQUIS) 5 MG TABS tablet Take 1 tablet (5 mg total) by mouth 2 (two) times daily.  Marland Kitchen FERREX 150 150 MG capsule Take 150 mg by mouth daily.  . fluticasone (FLONASE) 50 MCG/ACT nasal spray USE 2 SPRAYS EACH NOSTRIL DAILY AS NEEDED for allergies  . furosemide (LASIX) 20 MG tablet Take 20 mg by mouth daily.  . InFLIXimab (REMICADE IV) Inject into the vein every 8 (eight) weeks.  Marland Kitchen levothyroxine (SYNTHROID, LEVOTHROID) 100 MCG tablet Take 100 mcg by mouth daily before breakfast.  . pantoprazole (PROTONIX) 40 MG tablet Take 40 mg by mouth daily.  . potassium chloride SA (K-DUR,KLOR-CON) 20 MEQ tablet Take 20 mEq by mouth daily.  . simvastatin (  ZOCOR) 20 MG tablet Take 20 mg by mouth daily with breakfast.   . VITAMIN D, CHOLECALCIFEROL, PO Take 5,000 Units by mouth daily. D3  . Amino Acids-Protein Hydrolys (FEEDING SUPPLEMENT, PRO-STAT SUGAR FREE 64,) LIQD Take 30 mLs by mouth 2 (two) times daily between meals. (Patient not taking: Reported on 09/13/2015)  . diphenoxylate-atropine (LOMOTIL)  2.5-0.025 MG tablet Take 1 tablet by mouth 4 (four) times daily -  before meals and at bedtime. (Patient not taking: Reported on 09/13/2015)  . Ferumoxytol (FERAHEME IV) Inject into the vein once.  . ondansetron (ZOFRAN) 4 MG tablet Take 1 tablet (4 mg total) by mouth every 6 (six) hours as needed for nausea. (Patient not taking: Reported on 09/13/2015)    FAMILY HISTORY:  Her indicated that her mother is deceased. She indicated that her father is deceased. She indicated that the status of her neg hx is unknown.    SOCIAL HISTORY: She  reports that she has never smoked. She has never used smokeless tobacco. She reports that she does not drink alcohol or use drugs.  REVIEW OF SYSTEMS:   Unable to obtain.  SUBJECTIVE:  On low dose levophed.  VITAL SIGNS: BP (!) 97/56   Pulse (!) 139   Temp 99.1 F (37.3 C) (Oral)   Resp 16   Ht 5\' 7"  (1.702 m)   Wt 194 lb (88 kg)   SpO2 100%   BMI 30.38 kg/m   HEMODYNAMICS:    VENTILATOR SETTINGS: Vent Mode: PRVC FiO2 (%):  [100 %] 100 % Set Rate:  [16 bmp] 16 bmp Vt Set:  [500 mL] 500 mL PEEP:  [5 cmH20] 5 cmH20 Plateau Pressure:  [16 cmH20] 16 cmH20  INTAKE / OUTPUT: I/O last 3 completed shifts: In: 4000 [I.V.:4000] Out: 850 [Urine:450; Blood:400]  PHYSICAL EXAMINATION: General:  pale Neuro:  RASS -1, opens eyes with stimulation HEENT:  ETT in place Cardiovascular:  Regular, tachycardic Lungs:  No wheeze Abdomen:  Wound site clean Musculoskeletal:  1+edema Skin:  No rashes  LABS:  BMET  Recent Labs Lab 09/29/15 1059 Jul 08, 2015 1024  NA 136 134*  K 2.7* 4.5  CL 103 101  CO2 24 20*  BUN 21* 23*  CREATININE 0.94 1.14*  GLUCOSE 101* 101*    Electrolytes  Recent Labs Lab 09/29/15 1059 Jul 08, 2015 1024  CALCIUM 7.4* 7.3*    CBC  Recent Labs Lab 09/29/15 1059 Jul 08, 2015 1024  WBC 10.7* 10.3  HGB 8.9* 9.1*  HCT 29.1* 29.9*  PLT 369 299    Coag's No results for input(s): APTT, INR in the last 168  hours.  Sepsis Markers  Recent Labs Lab Jul 08, 2015 1033 Jul 08, 2015 1304  LATICACIDVEN 5.14* 2.71*    ABG No results for input(s): PHART, PCO2ART, PO2ART in the last 168 hours.  Liver Enzymes  Recent Labs Lab 09/29/15 1059 Jul 08, 2015 1024  AST 13* 57*  ALT 14 23  ALKPHOS 82 146*  BILITOT 0.4 1.1  ALBUMIN 1.7* 1.5*    Cardiac Enzymes No results for input(s): TROPONINI, PROBNP in the last 168 hours.  Glucose No results for input(s): GLUCAP in the last 168 hours.  Imaging Ct Abdomen Pelvis W Contrast  Result Date: 2016/01/10 CLINICAL DATA:  History of diverticulitis with progressive the abdominal pain. Suprapubic and left lower quadrant abdominal pain. Febrile and hypotensive. Crohn's colitis. EXAM: CT ABDOMEN AND PELVIS WITH CONTRAST TECHNIQUE: Multidetector CT imaging of the abdomen and pelvis was performed using the standard protocol following bolus administration of intravenous contrast. CONTRAST:  ISOVUE-300 IOPAMIDOL (ISOVUE-300) INJECTION 61% COMPARISON:  CT of the abdomen and pelvis 07/25/2015 FINDINGS: Lower chest: Small bilateral pleural effusions are present. Associated airspace disease is noted bilaterally. Heart size is normal. No significant pericardial effusion is present. Hepatobiliary: Diffuse fatty infiltration of the liver is present. No focal lesions are evident. Upper contour is within normal limits. The common bile duct and gallbladder are within normal limits. Pancreas: There is diffuse fatty infiltration liver. No discrete inflammation is present. Free air extends around the pancreas within the lesser sac. No focal pancreatic mass lesion is present. There is no significant ductal dilation. Spleen: Within normal limits Adrenals/Urinary Tract: The adrenal glands are normal bilaterally. Mild renal atrophy is present. A punctate nonobstructing stone at the lower pole of the right kidney is stable. There is no obstruction. The ureters are within normal limits. The  urinary bladder is normal. Stomach/Bowel: The stomach and duodenum are within normal limits. The small bowel is unremarkable. The appendix is not discretely visualized. The ascending and transverse colon are somewhat featureless. Extensive inflammatory changes are present in the descending and sigmoid colon. There is a focal collection adjacent to the descending colon image 44 of series 3 compatible with focal perforation and probable abscess. Extensive free air is compatible with bowel perforation, likely related to this particular site at the colon. Additional inflammatory changes are present in more distal descending and proximal sigmoid colon associated with diverticular change. No other discrete fluid collection or abscess is present. Vascular/Lymphatic: No significant adenopathy is present. No focal vascular lesions are present. Reproductive: Hysterectomy is noted. The adnexa are within normal limits for age. Other: Extensive free air is present. This is centered about the descending colon and extends into the lesser sac. A small amount of free fluid is noted. Musculoskeletal: For mild rightward curvature is present in the lumbar spine, centered at L2. Vertebral body heights alignment are maintained. Endplate changes are noted. The bony pelvis is intact. 11 mm sclerotic lesion in the right iliac bone likely represents a bone island. No other focal sclerotic lesions are present. IMPRESSION: 1. Bowel perforation. The site of perforation appears to be the descending/sigmoid colon. 2. Focal collection of contrast adjacent to the descending colon presumably representing the site of perforation. The collection measures 2.0 x 2.7 x 1.5 cm. This may represent a developing abscess. 3. Extensive inflammatory changes about the distal descending proximal sigmoid colon compatible with diverticulitis. 4. In addition to the diverticular disease, there is mild wall thickening of the more proximal colon. This is rather  featureless. Findings are compatible with Crohn's disease or ulcerative colitis. 5. Small amount of free fluid is noted. 6. Diffuse fatty infiltration of the liver. 7. Small bilateral pleural effusions and associated atelectasis. These results were called by telephone at the time of interpretation on 10/10/2015 at 2:05 pm to Dr. Raeford Razor , who verbally acknowledged these results. Electronically Signed   By: Marin Roberts M.D.   On: 09/20/2015 14:12   Dg Chest Port 1 View  Result Date: 09/17/2015 CLINICAL DATA:  Acute respiratory failure. EXAM: PORTABLE CHEST 1 VIEW COMPARISON:  One-view chest x-ray 10/04/2015 FINDINGS: The patient has been intubated. The endotracheal tube terminates 2 cm above the carina and could be pulled back 1-2 cm for more optimal positioning. The side port of the NG tube is in the fundus stomach. Lung volumes are low. A small left pleural effusion is present. Increased interstitial markings are noted bilaterally. IMPRESSION: 1. Interval intubation. The endotracheal tube  terminates 2 cm above the carina and could be pulled back 1-2 cm for more optimal position. 2. Increased diffuse interstitial pattern suggesting edema or less likely infection. 3. Small left pleural effusion. Electronically Signed   By: Marin Roberts M.D.   On: 09/23/2015 19:16   Dg Chest Portable 1 View  Result Date: 10/13/2015 CLINICAL DATA:  Fever EXAM: PORTABLE CHEST 1 VIEW COMPARISON:  September 29, 2015 FINDINGS: There is no edema or consolidation. The heart size and pulmonary vascularity are normal. No adenopathy. No bone lesions. IMPRESSION: No edema or consolidation. Electronically Signed   By: Bretta Bang III M.D.   On: 09/19/2015 10:51     STUDIES:  8/181 CT abd/pelvis >> fatty liver, free air, stone in Rt kidney, extensive inflammation descending and sigmoid colon with focal perforation and abscess  CULTURES: 8/18 Blood >> 8/18 Urine >>  ANTIBIOTICS: 8/18 Vancomycin >> 8/18  Levaquin >> 8/18 Aztreonam >>   SIGNIFICANT EVENTS: 8/18 Admit, to OR  LINES/TUBES: ETT 8/18 >>  Rt radial aline 8/18 >>   DISCUSSION: 70 yo female with recent dx of Crohn's disease presented with sepsis from perforated sigmoid diverticulum.  She had laparotomy with colectomy and colostomy.  She remained on vent post op.  ASSESSMENT / PLAN:  PULMONARY A: Acute respiratory failure. Hx of OSA. P:   Full vent support F/u CXR, ABG  CARDIOVASCULAR A:  Sepsis. Hx of HLD. P:  Continue IV fluids Wean pressors to keep MAP > 65 - defer CVL for now Hold outpt zocor  RENAL A:   Lactic acidosis >> trending down. P:   F/u lactic acid  GASTROINTESTINAL A:   Perforated sigmoid diverticulum. Crohn's disease. P:   NPO Pepcid for SUP Post-op care per surgery Hold outpt remicade  HEMATOLOGIC A:   Iron deficiency anemia. P:  F/u CBC SQ heparin for DVT prophylaxis  INFECTIOUS A:   Abdominal sepsis from perforated diverticulum. PCN allergy. P:   Abx as above  ENDOCRINE A:   Hx of hypothyroidism. P:   Continue synthroid  NEUROLOGIC A:   Acute metabolic encephalopathy. Hx of depression. P:   RASS goal: 0 to -1 Hold outpt lexapro, xanax  Updated pt's brother at bedside.  CC time 48 minutes.  Coralyn Helling, MD [facility] Pulmonary/Critical Care 10/11/2015, 8:11 PM Pager:  [phone] After 3pm call: [phone]

## 2015-10-01 NOTE — ED Notes (Signed)
Bed: WA02 Expected date: 2016-01-22 Expected time: 9:35 AM Means of arrival:  Comments: N/V/Weakness seen on Wed for same

## 2015-10-01 NOTE — Transfer of Care (Signed)
Immediate Anesthesia Transfer of Care Note  Patient: Jamie Wood  Procedure(s) Performed: Procedure(s): EXPLORATORY LAPAROTOMY (N/A) COLON RESECTION SIGMOID (N/A)  Patient Location: PACU and ICU  Anesthesia Type:General  Level of Consciousness: sedated, unresponsive and Patient remains intubated per anesthesia plan  Airway & Oxygen Therapy: Patient remains intubated per anesthesia plan  Post-op Assessment: Report given to RN and Post -op Vital signs reviewed and stable  Post vital signs: Reviewed and stable  Last Vitals:  Vitals:   10-06-2015 1546 2015-10-06 1547  BP:    Pulse: (!) 124 (!) 124  Resp:    Temp:      Last Pain:  Vitals:   06-Oct-2015 1133  TempSrc: Oral  PainSc:          Complications: No apparent anesthesia complications

## 2015-10-01 NOTE — Progress Notes (Signed)
eLink Physician-Brief Progress Note Patient Name: Jamie Wood DOB: May 01, 1945 MRN: [id number]   Date of Service  October 23, 2015  HPI/Events of Note  H/O Chron's disease on remecaide and steroids Admitted with perf bowel s/p colectolmy Now in ICU on vent, levaphed  eICU Interventions  Continue broad spectrum abx CXR, ABG Wean off pressors Start stress dose steroids. Full consult to follow.      Intervention Category Evaluation Type: New Patient Evaluation  Sierria Bruney 2015/10/23, 7:05 PM

## 2015-10-01 NOTE — ED Notes (Signed)
MD made aware of lactic values

## 2015-10-01 NOTE — ED Notes (Signed)
MD at bedside. 

## 2015-10-01 NOTE — Progress Notes (Signed)
Pt arrived in room 1239 at 1840.  Pt was intubated and levophed was running at 71mcg/min.  Pt vital signs stable upon arriving in room 1239.  Pt unresponsive upon arrival.  Fentanyl started at ordered rate.  Erick Blinks, RN

## 2015-10-01 NOTE — Anesthesia Postprocedure Evaluation (Signed)
Anesthesia Post Note  Patient: Jamie Wood  Procedure(s) Performed: Procedure(s) (LRB): EXPLORATORY LAPAROTOMY (N/A) COLON RESECTION SIGMOID (N/A)  Patient location during evaluation: ICU Anesthesia Type: General Level of consciousness: patient remains intubated per anesthesia plan Pain management: pain level controlled Vital Signs Assessment: vitals unstable and post-procedure vital signs reviewed and stable Respiratory status: patient on ventilator - see flowsheet for VS Cardiovascular status: acceptable on norepinephrine gtt. Anesthetic complications: no    Last Vitals:  Vitals:   09/15/2015 1910 10/07/2015 1920  BP:    Pulse:    Resp: (!) 0 16  Temp:      Last Pain:  Vitals:   09/30/2015 1133  TempSrc: Oral  PainSc:                  Jamie Wood

## 2015-10-01 NOTE — Anesthesia Procedure Notes (Addendum)
Procedure Name: Intubation Date/Time: 10/09/2015 4:04 PM Performed by: Delphia Grates Pre-anesthesia Checklist: Emergency Drugs available, Suction available, Patient being monitored and Patient identified Patient Re-evaluated:Patient Re-evaluated prior to inductionOxygen Delivery Method: Circle system utilized Preoxygenation: Pre-oxygenation with 100% oxygen Intubation Type: IV induction, Rapid sequence and Cricoid Pressure applied Laryngoscope Size: Glidescope and 4 Grade View: Grade II Tube type: Oral Tube size: 7.5 mm Number of attempts: 1 Airway Equipment and Method: Stylet Placement Confirmation: ETT inserted through vocal cords under direct vision,  positive ETCO2 and breath sounds checked- equal and bilateral Secured at: 22 cm Tube secured with: Tape Dental Injury: Teeth and Oropharynx as per pre-operative assessment  Comments: Elective glidescope use. Poor dentition.

## 2015-10-01 NOTE — Progress Notes (Addendum)
Pharmacy Antibiotic Note  Jamie Wood is a 70 y.o. female recently hospitalized and diagnosed with Crohn's now on Remicade, GIB during same hospitalization requiring 8 units PRBCs, DVT related to PICC on apixaban, and CKD-III seen in ED 8/16 with chills, hypokalemia, returns 8/18 with chills and worsening weakness.  Pharmacy has been consulted for Vancomycin, Aztreonam, and Levaquin dosing for sepsis. Noted unknown allergy to penicillins, no history of cephalosporin use in EPIC.  First doses antibiotics ordered in ED. CXR ordered.  BMET in process.   Plan: Vancomycin 1g IV x 1 in ED Aztreonam 2g IV x1 in ED Levaquin 750mg  IV x1 in ED F/u renal function and order maintenance doses accordingly   Height: 5\' 3"  (160 cm) Weight: 194 lb (88 kg) IBW/kg (Calculated) : 52.4  Temp (24hrs), Avg:102.9 F (39.4 C), Min:102.9 F (39.4 C), Max:102.9 F (39.4 C)   Recent Labs Lab 09/29/15 1059 2015-02-15 1033  WBC 10.7*  --   CREATININE 0.94  --   LATICACIDVEN  --  5.14*    Estimated Creatinine Clearance: 59.4 mL/min (by C-G formula based on SCr of 0.94 mg/dL).    Allergies  Allergen Reactions  . Benadryl [Diphenhydramine Hcl] Other (See Comments)    Jittery  . Codeine Nausea And Vomiting    Pt is unable to recall all reactions to codeine  . Mobic [Meloxicam] Other (See Comments)    Due to stage III kidney disease  . Penicillins     Has patient had a PCN reaction causing immediate rash, facial/tongue/throat swelling, SOB or lightheadedness with hypotension: unknown Has patient had a PCN reaction causing severe rash involving mucus membranes or skin necrosis: unknown Has patient had a PCN reaction that required hospitalization: yes, drs visit Has patient had a PCN reaction occurring within the last 10 years: yes If all of the above answers are "NO", then may proceed with Cephalosporin use.   Marland Kitchen. Propoxyphene Hcl Nausea And Vomiting  . Tramadol Nausea Only    jittery    Antimicrobials  this admission: 8/18 Levaquin >>  8/18 Vancomycin >>  8/18 Aztreonam >>  Dose adjustments this admission:   Microbiology results: 8/18 BCx: collected 8/18 UCx: ordered   Thank you for allowing pharmacy to be a part of this patient's care.  Jamie Wood, PharmD, BCPS 09/11/15, 10:52 AM  Pager: [phone]   Addendum: CrCl  = ~50 ml/min  Plan:  Vancomycin 1g IV x 1, then 750mg  IV q12h Aztreonam 2g IV x1, then 1g IV q8h Levaquin 750mg  IV x1, then 750mg  IV q24h F/u renal fxn, cultures, VT at Css as warranted, clinical course  Jamie Wood, PharmD, BCPS 09/11/15, 11:39 AM  Pager: [phone]

## 2015-10-01 NOTE — Anesthesia Preprocedure Evaluation (Addendum)
Anesthesia Evaluation  Patient identified by MRN, date of birth, ID band Patient awake    Reviewed: Allergy & Precautions, NPO status , Patient's Chart, lab work & pertinent test results  Airway Mallampati: III  TM Distance: >3 FB Neck ROM: Full    Dental no notable dental hx.    Pulmonary sleep apnea ,    Pulmonary exam normal        Cardiovascular + Peripheral Vascular Disease  Normal cardiovascular exam     Neuro/Psych Anxiety    GI/Hepatic GERD  Medicated and Controlled,  Endo/Other  Hypothyroidism   Renal/GU CRFRenal disease     Musculoskeletal  (+) Arthritis ,   Abdominal   Peds  Hematology  (+) anemia ,   Anesthesia Other Findings   Reproductive/Obstetrics                            Anesthesia Physical Anesthesia Plan  ASA: III and emergent  Anesthesia Plan: General   Post-op Pain Management:    Induction: Rapid sequence, Intravenous and Cricoid pressure planned  Airway Management Planned: Oral ETT  Additional Equipment: Arterial line  Intra-op Plan:   Post-operative Plan: Extubation in OR  Informed Consent: I have reviewed the patients History and Physical, chart, labs and discussed the procedure including the risks, benefits and alternatives for the proposed anesthesia with the patient or authorized representative who has indicated his/her understanding and acceptance.   Dental advisory given  Plan Discussed with:   Anesthesia Plan Comments:        Anesthesia Quick Evaluation

## 2015-10-01 NOTE — H&P (Signed)
Chief Complaint:  Abdominal pain and free air.    History of Present Illness:  Jamie Wood is an 70 y.o. female who was admitted to [facility] with GI bleed this past May and found to have Crohn's disease.  She has been treated with Remicade and steroids.  Yesterday, she had onset of pain that has grown worse.  She presented to the [facility] ED with a tender abdomen, free air on CT, and hypotension that may be related to recently stopped steroids.    Past Medical History:  Diagnosis Date  . Acute lower GI bleeding   . Allergic rhinitis   . Anemia due to GI blood loss   . Anxiety   . Arthritis   . Bilateral lower extremity edema   . Crohn's colitis ([place]) 07/09/2015  . Crohn's disease ([place])   . Diverticulitis   . DVT of right axillary vein, acute   . GERD (gastroesophageal reflux disease)   . HLD (hyperlipidemia)   . Hypercholesterolemia   . Insomnia   . Osteoporosis   . Physical deconditioning   . Physical deconditioning   . Protein calorie malnutrition ([place])   . Renal insufficiency    stage 3 kidney disease  . Sleep apnea    occasionally wears a c-pap  . Thyroid disease   . Vitamin D deficiency     Past Surgical History:  Procedure Laterality Date  . APPENDECTOMY  1957  . COLONOSCOPY    . COLONOSCOPY WITH PROPOFOL N/A 07/20/2015   Procedure: COLONOSCOPY WITH PROPOFOL;  Surgeon: Gatha Mayer, MD;  Location: [facility] ENDOSCOPY;  Service: Endoscopy;  Laterality: N/A;  . PARTIAL HYSTERECTOMY  1978   vaginal  . TONSILLECTOMY  1967    Current Facility-Administered Medications  Medication Dose Route Frequency Provider Last Rate Last Dose  . aztreonam (AZACTAM) 1 g in dextrose 5 % 50 mL IVPB  1 g Intravenous Q8H Virgel Manifold, MD      . hydrocortisone sodium succinate (SOLU-CORTEF) 100 MG injection 100 mg  100 mg Intravenous Once Virgel Manifold, MD      . Derrill Memo ON 10/02/2015] levofloxacin (LEVAQUIN) IVPB 750 mg  750 mg Intravenous Q24H Virgel Manifold, MD      . vancomycin (VANCOCIN) IVPB 1000 mg/200  mL premix  1,000 mg Intravenous Once Virgel Manifold, MD 200 mL/hr at 10/10/2015 1353 1,000 mg at 09/18/2015 1353  . [START ON 10/02/2015] vancomycin (VANCOCIN) IVPB 750 mg/150 ml premix  750 mg Intravenous Q12H Virgel Manifold, MD       Current Outpatient Prescriptions  Medication Sig Dispense Refill  . acetaminophen (TYLENOL) 325 MG tablet Take 2 tablets (650 mg total) by mouth every 6 (six) hours as needed for mild pain (or Fever >/= 101).    Marland Kitchen ALPRAZolam (XANAX) 0.25 MG tablet Take 0.25 mg by mouth daily. Also takes at bedtime if needed.    Marland Kitchen apixaban (ELIQUIS) 5 MG TABS tablet Take 1 tablet (5 mg total) by mouth 2 (two) times daily. 60 tablet   . diphenoxylate-atropine (LOMOTIL) 2.5-0.025 MG tablet Take 1 tablet by mouth 4 (four) times daily as needed for diarrhea or loose stools.    Marland Kitchen escitalopram (LEXAPRO) 10 MG tablet Take 5 mg by mouth daily.    Marland Kitchen FERREX 150 150 MG capsule Take 150 mg by mouth daily.  12  . fluticasone (FLONASE) 50 MCG/ACT nasal spray USE 2 SPRAYS EACH NOSTRIL DAILY AS NEEDED for allergies  12  . furosemide (LASIX) 20 MG tablet Take 20 mg  by mouth daily.    . InFLIXimab (REMICADE IV) Inject into the vein every 8 (eight) weeks.    Marland Kitchen levothyroxine (SYNTHROID, LEVOTHROID) 100 MCG tablet Take 100 mcg by mouth daily before breakfast.    . pantoprazole (PROTONIX) 40 MG tablet Take 40 mg by mouth daily.    . potassium chloride SA (K-DUR,KLOR-CON) 20 MEQ tablet Take 20 mEq by mouth daily.    . simvastatin (ZOCOR) 20 MG tablet Take 20 mg by mouth daily with breakfast.     . VITAMIN D, CHOLECALCIFEROL, PO Take 5,000 Units by mouth daily. D3    . Amino Acids-Protein Hydrolys (FEEDING SUPPLEMENT, PRO-STAT SUGAR FREE 64,) LIQD Take 30 mLs by mouth 2 (two) times daily between meals. (Patient not taking: Reported on 09/13/2015) 900 mL 0  . diphenoxylate-atropine (LOMOTIL) 2.5-0.025 MG tablet Take 1 tablet by mouth 4 (four) times daily -  before meals and at bedtime. (Patient not taking: Reported  on 09/13/2015) 30 tablet 0  . Ferumoxytol (FERAHEME IV) Inject into the vein once.    . ondansetron (ZOFRAN) 4 MG tablet Take 1 tablet (4 mg total) by mouth every 6 (six) hours as needed for nausea. (Patient not taking: Reported on 09/13/2015) 20 tablet 0   Benadryl [diphenhydramine hcl]; Codeine; Mobic [meloxicam]; Penicillins; Propoxyphene hcl; and Tramadol Family History  Problem Relation Age of Onset  . Colon cancer Neg Hx   . Pancreatic cancer Neg Hx   . Stomach cancer Neg Hx   . Esophageal cancer Neg Hx   . Rectal cancer Neg Hx    Social History:   reports that she has never smoked. She has never used smokeless tobacco. She reports that she does not drink alcohol or use drugs.   REVIEW OF SYSTEMS : Negative except for see problem list  Physical Exam:   Blood pressure 90/55, pulse 98, temperature 99.1 F (37.3 C), temperature source Oral, resp. rate 23, height 5' 3"  (1.6 m), weight 88 kg (194 lb), SpO2 99 %. Body mass index is 34.37 kg/m.  Gen:  WDWN WF NAD  Neurological: Alert and oriented to person, place, and time. Motor and sensory function is grossly intact  Head: Normocephalic and atraumatic.  Eyes: Conjunctivae are normal. Pupils are equal, round, and reactive to light. No scleral icterus.  Neck: Normal range of motion. Neck supple. No tracheal deviation or thyromegaly present.  Cardiovascular:  SR without murmurs or gallops.  No carotid bruits Breast:  Not examined Respiratory: Effort normal.  No respiratory distress. No chest wall tenderness. Breath sounds normal.  No wheezes, rales or rhonchi.  Abdomen:  Mildly obese;  Prior appendectomy and partial hysterectomy GU:  Not examined Musculoskeletal: Normal range of motion. Extremities are nontender. No cyanosis, edema or clubbing noted Lymphadenopathy: No cervical, preauricular, postauricular or axillary adenopathy is present Skin: Skin is warm and dry. No rash noted. No diaphoresis. No erythema. No pallor. Pscyh:  Normal mood and affect. Behavior is normal. Judgment and thought content normal.   LABORATORY RESULTS: Results for orders placed or performed during the hospital encounter of 09/26/2015 (from the past 48 hour(s))  Culture, blood (Routine x 2)     Status: None (Preliminary result)   Collection Time: 09/16/2015 10:19 AM  Result Value Ref Range   Specimen Description      BLOOD RIGHT PICC LINE Performed at [facility]    Culture PENDING    Report Status PENDING  Comprehensive metabolic panel     Status: Abnormal   Collection Time: 10/14/2015 10:24 AM  Result Value Ref Range   Sodium 134 (L) 135 - 145 mmol/L   Potassium 4.5 3.5 - 5.1 mmol/L    Comment: RESULT REPEATED AND VERIFIED DELTA CHECK NOTED MODERATE HEMOLYSIS    Chloride 101 101 - 111 mmol/L   CO2 20 (L) 22 - 32 mmol/L   Glucose, Bld 101 (H) 65 - 99 mg/dL   BUN 23 (H) 6 - 20 mg/dL   Creatinine, Ser 1.14 (H) 0.44 - 1.00 mg/dL   Calcium 7.3 (L) 8.9 - 10.3 mg/dL   Total Protein 4.6 (L) 6.5 - 8.1 g/dL   Albumin 1.5 (L) 3.5 - 5.0 g/dL   AST 57 (H) 15 - 41 U/L   ALT 23 14 - 54 U/L   Alkaline Phosphatase 146 (H) 38 - 126 U/L   Total Bilirubin 1.1 0.3 - 1.2 mg/dL   GFR calc non Af Amer 48 (L) >60 mL/min   GFR calc Af Amer 56 (L) >60 mL/min    Comment: (NOTE) The eGFR has been calculated using the CKD EPI equation. This calculation has not been validated in all clinical situations. eGFR's persistently <60 mL/min signify possible Chronic Kidney Disease.    Anion gap 13 5 - 15  CBC with Differential     Status: Abnormal   Collection Time: 10/05/2015 10:24 AM  Result Value Ref Range   WBC 10.3 4.0 - 10.5 K/uL   RBC 3.16 (L) 3.87 - 5.11 MIL/uL   Hemoglobin 9.1 (L) 12.0 - 15.0 g/dL   HCT 29.9 (L) 36.0 - 46.0 %   MCV 94.6 78.0 - 100.0 fL   MCH 28.8 26.0 - 34.0 pg   MCHC 30.4 30.0 - 36.0 g/dL   RDW 18.8 (H) 11.5 - 15.5 %   Platelets 299 150 - 400 K/uL    Neutrophils Relative % 96 %   Lymphocytes Relative 2 %   Monocytes Relative 2 %   Eosinophils Relative 0 %   Basophils Relative 0 %   Neutro Abs 9.9 (H) 1.7 - 7.7 K/uL   Lymphs Abs 0.2 (L) 0.7 - 4.0 K/uL   Monocytes Absolute 0.2 0.1 - 1.0 K/uL   Eosinophils Absolute 0.0 0.0 - 0.7 K/uL   Basophils Absolute 0.0 0.0 - 0.1 K/uL   RBC Morphology POLYCHROMASIA PRESENT     Comment: BASOPHILIC STIPPLING   WBC Morphology DOHLE BODIES   I-Stat CG4 Lactic Acid, ED     Status: Abnormal   Collection Time: 10/06/2015 10:33 AM  Result Value Ref Range   Lactic Acid, Venous 5.14 (HH) 0.5 - 1.9 mmol/L   Comment NOTIFIED PHYSICIAN   Urinalysis, Routine w reflex microscopic     Status: Abnormal   Collection Time: 10/13/2015 11:32 AM  Result Value Ref Range   Color, Urine AMBER (A) YELLOW    Comment: BIOCHEMICALS MAY BE AFFECTED BY COLOR   APPearance CLEAR CLEAR   Specific Gravity, Urine 1.018 1.005 - 1.030   pH 5.0 5.0 - 8.0   Glucose, UA NEGATIVE NEGATIVE mg/dL   Hgb urine dipstick NEGATIVE NEGATIVE   Bilirubin Urine SMALL (A) NEGATIVE   Ketones, ur NEGATIVE NEGATIVE mg/dL   Protein, ur NEGATIVE NEGATIVE mg/dL   Nitrite NEGATIVE NEGATIVE   Leukocytes, UA NEGATIVE NEGATIVE    Comment: MICROSCOPIC NOT DONE ON URINES WITH NEGATIVE PROTEIN, BLOOD, LEUKOCYTES, NITRITE, OR GLUCOSE <1000 mg/dL.  I-Stat CG4 Lactic Acid, ED  (not at  [facility])     Status: Abnormal   Collection Time: 10/11/2015  1:04 PM  Result Value Ref Range   Lactic Acid, Venous 2.71 (HH) 0.5 - 1.9 mmol/L   Comment NOTIFIED PHYSICIAN      RADIOLOGY RESULTS: Ct Abdomen Pelvis W Contrast  Result Date: 09/15/2015 CLINICAL DATA:  History of diverticulitis with progressive the abdominal pain. Suprapubic and left lower quadrant abdominal pain. Febrile and hypotensive. Crohn's colitis. EXAM: CT ABDOMEN AND PELVIS WITH CONTRAST TECHNIQUE: Multidetector CT imaging of the abdomen and pelvis was performed using the standard protocol following bolus  administration of intravenous contrast. CONTRAST:  186m ISOVUE-300 IOPAMIDOL (ISOVUE-300) INJECTION 61% COMPARISON:  CT of the abdomen and pelvis 07/25/2015 FINDINGS: Lower chest: Small bilateral pleural effusions are present. Associated airspace disease is noted bilaterally. Heart size is normal. No significant pericardial effusion is present. Hepatobiliary: Diffuse fatty infiltration of the liver is present. No focal lesions are evident. Upper contour is within normal limits. The common bile duct and gallbladder are within normal limits. Pancreas: There is diffuse fatty infiltration liver. No discrete inflammation is present. Free air extends around the pancreas within the lesser sac. No focal pancreatic mass lesion is present. There is no significant ductal dilation. Spleen: Within normal limits Adrenals/Urinary Tract: The adrenal glands are normal bilaterally. Mild renal atrophy is present. A punctate nonobstructing stone at the lower pole of the right kidney is stable. There is no obstruction. The ureters are within normal limits. The urinary bladder is normal. Stomach/Bowel: The stomach and duodenum are within normal limits. The small bowel is unremarkable. The appendix is not discretely visualized. The ascending and transverse colon are somewhat featureless. Extensive inflammatory changes are present in the descending and sigmoid colon. There is a focal collection adjacent to the descending colon image 44 of series 3 compatible with focal perforation and probable abscess. Extensive free air is compatible with bowel perforation, likely related to this particular site at the colon. Additional inflammatory changes are present in more distal descending and proximal sigmoid colon associated with diverticular change. No other discrete fluid collection or abscess is present. Vascular/Lymphatic: No significant adenopathy is present. No focal vascular lesions are present. Reproductive: Hysterectomy is noted. The  adnexa are within normal limits for age. Other: Extensive free air is present. This is centered about the descending colon and extends into the lesser sac. A small amount of free fluid is noted. Musculoskeletal: For mild rightward curvature is present in the lumbar spine, centered at L2. Vertebral body heights alignment are maintained. Endplate changes are noted. The bony pelvis is intact. 11 mm sclerotic lesion in the right iliac bone likely represents a bone island. No other focal sclerotic lesions are present. IMPRESSION: 1. Bowel perforation. The site of perforation appears to be the descending/sigmoid colon. 2. Focal collection of contrast adjacent to the descending colon presumably representing the site of perforation. The collection measures 2.0 x 2.7 x 1.5 cm. This may represent a developing abscess. 3. Extensive inflammatory changes about the distal descending proximal sigmoid colon compatible with diverticulitis. 4. In addition to the diverticular disease, there is mild wall thickening of the more proximal colon. This is rather featureless. Findings are compatible with Crohn's disease or ulcerative colitis. 5. Small amount of free fluid is noted. 6. Diffuse fatty infiltration of the liver. 7. Small bilateral pleural effusions and associated atelectasis. These results were called by telephone at the time of interpretation on 09/16/2015 at 2:05 pm to Dr. SVirgel Manifold, who verbally acknowledged these  results. Electronically Signed   By: San Morelle M.D.   On: 10/13/2015 14:12   Dg Chest Portable 1 View  Result Date: 09/15/2015 CLINICAL DATA:  Fever EXAM: PORTABLE CHEST 1 VIEW COMPARISON:  September 29, 2015 FINDINGS: There is no edema or consolidation. The heart size and pulmonary vascularity are normal. No adenopathy. No bone lesions. IMPRESSION: No edema or consolidation. Electronically Signed   By: Lowella Grip III M.D.   On: 09/29/2015 10:51    Problem List: Patient Active Problem List    Diagnosis Date Noted  . DVT of right axillary vein, acute 07/29/2015  . Protein-calorie malnutrition, severe ([place]) 07/29/2015  . Chronic kidney disease, stage 3 07/28/2015  . Hyponatremia 07/28/2015  . Dehydration   . Acute lower GI bleeding   . AKI (acute kidney injury) ([place]) 07/09/2015  . Anemia due to GI blood loss 07/09/2015  . Diarrhea in adult patient 07/09/2015  . Hypovolemia dehydration 07/09/2015  . Crohn's colitis ([place]) 07/09/2015  . Hypothyroidism 06/08/2009  . Vitamin D deficiency 06/08/2009  . Hyperlipidemia 06/08/2009  . Anxiety state 06/08/2009  . GERD 06/08/2009  . Diverticulosis of large intestine 06/08/2009  . DIVERTICULITIS, COLON 06/08/2009  . OSTEOARTHRITIS 06/08/2009  . COLONIC POLYPS, HYPERPLASTIC, HX OF 06/08/2009    Assessment & Plan: Probable perforated colitits;  I have explained exploratory laparotomy and possible colostomy    Matt B. Hassell Done, MD, [facility] [phone] beeper [phone]  09/20/2015 2:47 PM

## 2015-10-01 NOTE — ED Provider Notes (Signed)
[facility]-EMERGENCY DEPT Provider Note   CSN: [id number] Arrival date & time: 10/02/2015  4098   By signing my name below, I, Freida Busman, attest that this documentation has been prepared under the direction and in the presence of Raeford Razor, MD . Electronically Signed: Freida Busman, Scribe. 09/22/2015. 10:17 AM.  History   Chief Complaint Chief Complaint  Patient presents with  . Abdominal Pain  . Weakness    The history is provided by the patient. No language interpreter was used.     HPI Comments:  Jamie Wood is a 70 y.o. female with a history of DVT, Diverticulitis, GI bleed, and CKD who presents to the Emergency Department via EMS complaining of gradually worsening, generalized weakness x a few days. She reports associated nausea, 1 small episode of vomiting today, and lower abdominal pain since yesterday (LLQ>RLQ) . She states she was diagnosed with Crohn's 5-6 weeks ago. Pt notes she was seen in the ED on 09/29/15 for "uncontrollable chills" and was informed of low potassium. She was discharged home the same day where her chills returned.  She denies urinary symptoms, and blood in her stool. No sick contacts or recent use of antibiotics. Pt lives alone.  Past Medical History:  Diagnosis Date  . Acute lower GI bleeding   . Allergic rhinitis   . Anemia due to GI blood loss   . Anxiety   . Arthritis   . Bilateral lower extremity edema   . Crohn's colitis (HCC) 07/09/2015  . Crohn's disease (HCC)   . Diverticulitis   . DVT of right axillary vein, acute   . GERD (gastroesophageal reflux disease)   . HLD (hyperlipidemia)   . Hypercholesterolemia   . Insomnia   . Osteoporosis   . Physical deconditioning   . Physical deconditioning   . Protein calorie malnutrition (HCC)   . Renal insufficiency    stage 3 kidney disease  . Sleep apnea    occasionally wears a c-pap  . Thyroid disease   . Vitamin D deficiency     Patient Active Problem List   Diagnosis Date Noted  .  DVT of right axillary vein, acute 07/29/2015  . Protein-calorie malnutrition, severe (HCC) 07/29/2015  . Chronic kidney disease, stage 3 07/28/2015  . Hyponatremia 07/28/2015  . Dehydration   . Acute lower GI bleeding   . AKI (acute kidney injury) (HCC) 07/09/2015  . Anemia due to GI blood loss 07/09/2015  . Diarrhea in adult patient 07/09/2015  . Hypovolemia dehydration 07/09/2015  . Crohn's colitis (HCC) 07/09/2015  . Hypothyroidism 06/08/2009  . Vitamin D deficiency 06/08/2009  . Hyperlipidemia 06/08/2009  . Anxiety state 06/08/2009  . GERD 06/08/2009  . Diverticulosis of large intestine 06/08/2009  . DIVERTICULITIS, COLON 06/08/2009  . OSTEOARTHRITIS 06/08/2009  . COLONIC POLYPS, HYPERPLASTIC, HX OF 06/08/2009    Past Surgical History:  Procedure Laterality Date  . APPENDECTOMY  1957  . COLONOSCOPY    . COLONOSCOPY WITH PROPOFOL N/A 07/20/2015   Procedure: COLONOSCOPY WITH PROPOFOL;  Surgeon: Iva Boop, MD;  Location: [facility] ENDOS[facility];  Service: Endoscopy;  Laterality: N/A;  . PARTIAL HYSTERECTOMY  1978   vaginal  . TONSILLECTOMY  1967    OB History    No data available       Home Medications    Prior to Admission medications   Medication Sig Start Date End Date Taking? Authorizing Provider  acetaminophen (TYLENOL) 325 MG tablet Take 2 tablets (650 mg total) by mouth every 6 (  six) hours as needed for mild pain (or Fever >/= 101). 07/28/15  Yes Calvert Cantor, MD  ALPRAZolam Prudy Feeler) 0.25 MG tablet Take 0.25 mg by mouth daily. Also takes at bedtime if needed.   Yes Historical Provider, MD  apixaban (ELIQUIS) 5 MG TABS tablet Take 1 tablet (5 mg total) by mouth 2 (two) times daily. 07/31/15  Yes Calvert Cantor, MD  diphenoxylate-atropine (LOMOTIL) 2.5-0.025 MG tablet Take 1 tablet by mouth 4 (four) times daily as needed for diarrhea or loose stools.   Yes Historical Provider, MD  escitalopram (LEXAPRO) 10 MG tablet Take 5 mg by mouth daily.   Yes Historical Provider, MD    FERREX 150 150 MG capsule Take 150 mg by mouth daily. 08/23/15  Yes Historical Provider, MD  fluticasone (FLONASE) 50 MCG/ACT nasal spray USE 2 SPRAYS EACH NOSTRIL DAILY AS NEEDED for allergies 06/01/15  Yes Historical Provider, MD  furosemide (LASIX) 20 MG tablet Take 20 mg by mouth daily.   Yes Historical Provider, MD  InFLIXimab (REMICADE IV) Inject into the vein every 8 (eight) weeks.   Yes Historical Provider, MD  levothyroxine (SYNTHROID, LEVOTHROID) 100 MCG tablet Take 100 mcg by mouth daily before breakfast.   Yes Historical Provider, MD  pantoprazole (PROTONIX) 40 MG tablet Take 40 mg by mouth daily.   Yes Historical Provider, MD  potassium chloride SA (K-DUR,KLOR-CON) 20 MEQ tablet Take 20 mEq by mouth daily.   Yes Historical Provider, MD  simvastatin (ZOCOR) 20 MG tablet Take 20 mg by mouth daily with breakfast.    Yes Historical Provider, MD  VITAMIN D, CHOLECALCIFEROL, PO Take 5,000 Units by mouth daily. D3   Yes Historical Provider, MD  Amino Acids-Protein Hydrolys (FEEDING SUPPLEMENT, PRO-STAT SUGAR FREE 64,) LIQD Take 30 mLs by mouth 2 (two) times daily between meals. Patient not taking: Reported on 09/13/2015 07/28/15   Calvert Cantor, MD  diphenoxylate-atropine (LOMOTIL) 2.5-0.025 MG tablet Take 1 tablet by mouth 4 (four) times daily -  before meals and at bedtime. Patient not taking: Reported on 09/13/2015 07/28/15   Calvert Cantor, MD  Ferumoxytol [facility] IV) Inject into the vein once.    Historical Provider, MD  ondansetron (ZOFRAN) 4 MG tablet Take 1 tablet (4 mg total) by mouth every 6 (six) hours as needed for nausea. Patient not taking: Reported on 09/13/2015 07/28/15   Calvert Cantor, MD    Family History Family History  Problem Relation Age of Onset  . Colon cancer Neg Hx   . Pancreatic cancer Neg Hx   . Stomach cancer Neg Hx   . Esophageal cancer Neg Hx   . Rectal cancer Neg Hx     Social History Social History  Substance Use Topics  . Smoking status: Never Smoker  .  Smokeless tobacco: Never Used  . Alcohol use No     Allergies   Benadryl [diphenhydramine hcl]; Codeine; Mobic [meloxicam]; Penicillins; Propoxyphene hcl; and Tramadol   Review of Systems Review of Systems  Constitutional: Positive for chills.  Gastrointestinal: Positive for abdominal pain, nausea and vomiting. Negative for blood in stool.  Genitourinary: Negative for difficulty urinating, dyspareunia, frequency, hematuria and urgency.  Neurological: Positive for weakness (generalized).   Physical Exam Updated Vital Signs BP (!) 83/49 (BP Location: Right Arm)   Pulse 103   Temp 99.1 F (37.3 C) (Oral)   Resp 25   Ht 5\' 3"  (1.6 m)   Wt 194 lb (88 kg)   SpO2 94%   BMI 34.37 kg/m  Physical Exam  Constitutional: She is oriented to person, place, and time. She appears well-developed and well-nourished. No distress.  HENT:  Head: Normocephalic and atraumatic.  Eyes: Conjunctivae are normal.  Cardiovascular: Normal rate and regular rhythm.   Pulmonary/Chest: Effort normal and breath sounds normal. No respiratory distress.  Abdominal: She exhibits no distension. There is tenderness in the suprapubic area and left lower quadrant. There is no rebound and no guarding.  Neurological: She is alert and oriented to person, place, and time.  Skin: Skin is warm and dry.  Psychiatric: She has a normal mood and affect.  Nursing note and vitals reviewed.   ED Treatments / Results  DIAGNOSTIC STUDIES:  Oxygen Saturation is 96% on RA,  by my interpretation.    COORDINATION OF CARE:  10:05 AM Discussed treatment plan with pt at bedside and pt agreed to plan.  Labs (all labs ordered are listed, but only abnormal results are displayed) Labs Reviewed  COMPREHENSIVE METABOLIC PANEL - Abnormal; Notable for the following:       Result Value   Sodium 134 (*)    CO2 20 (*)    Glucose, Bld 101 (*)    BUN 23 (*)    Creatinine, Ser 1.14 (*)    Calcium 7.3 (*)    Total Protein 4.6 (*)      Albumin 1.5 (*)    AST 57 (*)    Alkaline Phosphatase 146 (*)    GFR calc non Af Amer 48 (*)    GFR calc Af Amer 56 (*)    All other components within normal limits  CBC WITH DIFFERENTIAL/PLATELET - Abnormal; Notable for the following:    RBC 3.16 (*)    Hemoglobin 9.1 (*)    HCT 29.9 (*)    RDW 18.8 (*)    Neutro Abs 9.9 (*)    Lymphs Abs 0.2 (*)    All other components within normal limits  URINALYSIS, ROUTINE W REFLEX MICROSCOPIC (NOT AT [facility]) - Abnormal; Notable for the following:    Color, Urine AMBER (*)    Bilirubin Urine SMALL (*)    All other components within normal limits  I-STAT CG4 LACTIC ACID, ED - Abnormal; Notable for the following:    Lactic Acid, Venous 5.14 (*)    All other components within normal limits  I-STAT CG4 LACTIC ACID, ED - Abnormal; Notable for the following:    Lactic Acid, Venous 2.71 (*)    All other components within normal limits  CULTURE, BLOOD (ROUTINE X 2)  CULTURE, BLOOD (ROUTINE X 2)  URINE CULTURE  I-STAT CG4 LACTIC ACID, ED    EKG  EKG Interpretation  Date/Time:  Friday October 01 2015 10:28:39 EDT Ventricular Rate:  108 PR Interval:    QRS Duration: 81 QT Interval:  311 QTC Calculation: 417 R Axis:   48 Text Interpretation:  Sinus tachycardia Low voltage, precordial leads Borderline T abnormalities, anterior leads Confirmed by Juleen China  MD, Fayrene Towner ([id number]) on 09/24/2015 1:04:20 PM       Radiology Dg Chest Portable 1 View  Result Date: 09/23/2015 CLINICAL DATA:  Fever EXAM: PORTABLE CHEST 1 VIEW COMPARISON:  September 29, 2015 FINDINGS: There is no edema or consolidation. The heart size and pulmonary vascularity are normal. No adenopathy. No bone lesions. IMPRESSION: No edema or consolidation. Electronically Signed   By: Bretta Bang III M.D.   On: 09/17/2015 10:51    Procedures Procedures  10:10 AM Angiocath insertion Performed by: Raeford Razor, MD Consent:  Verbal consent obtained. Risks and benefits: risks, benefits  and alternatives were discussed Time out: Immediately prior to procedure a "time out" was called to verify the correct patient, procedure, equipment, support staff and site/side marked as required. Preparation: Patient was prepped and draped in the usual sterile fashion. Vein Location: right EJ Gauge: 20  Normal blood return and flush without difficulty Patient tolerance: Patient tolerated the procedure well with no immediate complications.  CRITICAL CARE Performed by: Raeford Razor Total critical care time: 80 minutes Critical care time was exclusive of separately billable procedures and treating other patients. Critical care was necessary to treat or prevent imminent or life-threatening deterioration. Critical care was time spent personally by me on the following activities: development of treatment plan with patient and/or surrogate as well as nursing, discussions with consultants, evaluation of patient's response to treatment, examination of patient, obtaining history from patient or surrogate, ordering and performing treatments and interventions, ordering and review of laboratory studies, ordering and review of radiographic studies, pulse oximetry and re-evaluation of patient's condition.   Medications Ordered in ED Medications  levofloxacin (LEVAQUIN) IVPB 750 mg (not administered)  aztreonam (AZACTAM) 1 g in dextrose 5 % 50 mL IVPB (not administered)  vancomycin (VANCOCIN) IVPB 750 mg/150 ml premix (not administered)  sodium chloride 0.9 % bolus 1,000 mL (0 mLs Intravenous Stopped 10-22-2015 1121)    And  sodium chloride 0.9 % bolus 1,000 mL (0 mLs Intravenous Stopped October 22, 2015 1131)    And  sodium chloride 0.9 % bolus 1,000 mL (0 mLs Intravenous Stopped Oct 22, 2015 1356)  levofloxacin (LEVAQUIN) IVPB 750 mg (0 mg Intravenous Stopped 22-Oct-2015 1329)  aztreonam (AZACTAM) 2 g in dextrose 5 % 50 mL IVPB (0 g Intravenous Stopped 2015-10-22 1130)  vancomycin (VANCOCIN) IVPB 1000 mg/200 mL premix  (1,000 mg Intravenous New Bag/Given 10-22-15 1353)  acetaminophen (TYLENOL) tablet 650 mg (650 mg Oral Given 2015/10/22 1039)  diatrizoate meglumine-sodium (GASTROGRAFIN) 66-10 % solution 30 mL (30 mLs Oral Given 10-22-2015 1117)  norepinephrine (LEVOPHED) 4 mg in dextrose 5 % 250 mL (0.016 mg/mL) infusion (2 mcg/min Intravenous New Bag/Given 10-22-15 1354)  iopamidol (ISOVUE-300) 61 % injection 100 mL (100 mLs Intravenous Contrast Given 2015-10-22 1326)  ondansetron (ZOFRAN) injection 4 mg (4 mg Intravenous Given 2015-10-22 1406)  hydrocortisone sodium succinate (SOLU-CORTEF) 100 MG injection 100 mg (100 mg Intravenous Given 10/22/2015 1454)     Initial Impression / Assessment and Plan / ED Course  I have reviewed the triage vital signs and the nursing notes.  Pertinent labs & imaging results that were available during my care of the patient were reviewed by me and considered in my medical decision making (see chart for details).  Clinical Course    70yF with rigors and lower abdominal pain. UTI? Colitis? Abscess? Other? Febrile/tachycardic/hypotensive. Empiric 30 ml/kg NS and abx for likely sepsis. She appears pale and has previous required transfusion for GIB. Denies overt bleeding or melena currently.   Labs, blood cultures, UA, CXR. With her abdominal tenderness and history will also CT a/p.   1:21 PM Persistently hypotensive after over 3L. No clear source of fever at this point. CT still pending though. Lactic acid >5. Repeat pending. Anemic, but appears stable from most recent labs. Moved to resuscitation room. Levophed ordered. Discussed with CCM. Exam w/o significant change since initial assessment. Remains somewhat drowsy but not confused. Protecting airway. No new complaints.   2:07 PM CT with perforated diverticulitis. Pretty good amount of free air. Discussed with surgery, Dr Daphine Deutscher.  Final Clinical Impressions(s) / ED Diagnoses   Final diagnoses:  Perforated viscus  Septic shock (HCC)      New Prescriptions New Prescriptions   No medications on file    I personally preformed the services scribed in my presence. The recorded information has been reviewed is accurate. Raeford RazorStephen Dolores Ewing, MD.     Raeford RazorStephen Dannisha Eckmann, MD 19-Dec-2015 [phone]

## 2015-10-01 NOTE — ED Notes (Signed)
Unsuccessful lab draw x 2 for I-STAT RN made aware.

## 2015-10-02 ENCOUNTER — Inpatient Hospital Stay (HOSPITAL_COMMUNITY): Payer: Medicare Other

## 2015-10-02 DIAGNOSIS — E872 Acidosis, unspecified: Secondary | ICD-10-CM

## 2015-10-02 DIAGNOSIS — J9600 Acute respiratory failure, unspecified whether with hypoxia or hypercapnia: Secondary | ICD-10-CM

## 2015-10-02 DIAGNOSIS — J9601 Acute respiratory failure with hypoxia: Secondary | ICD-10-CM

## 2015-10-02 LAB — BLOOD CULTURE ID PANEL (REFLEXED)
ACINETOBACTER BAUMANNII: NOT DETECTED
CANDIDA GLABRATA: NOT DETECTED
Candida albicans: NOT DETECTED
Candida krusei: NOT DETECTED
Candida parapsilosis: NOT DETECTED
Candida tropicalis: NOT DETECTED
Carbapenem resistance: NOT DETECTED
ENTEROBACTER CLOACAE COMPLEX: NOT DETECTED
ESCHERICHIA COLI: DETECTED — AB
Enterobacteriaceae species: DETECTED — AB
Enterococcus species: NOT DETECTED
HAEMOPHILUS INFLUENZAE: NOT DETECTED
Klebsiella oxytoca: NOT DETECTED
Klebsiella pneumoniae: NOT DETECTED
LISTERIA MONOCYTOGENES: NOT DETECTED
METHICILLIN RESISTANCE: NOT DETECTED
NEISSERIA MENINGITIDIS: NOT DETECTED
Proteus species: NOT DETECTED
Pseudomonas aeruginosa: NOT DETECTED
SERRATIA MARCESCENS: NOT DETECTED
STREPTOCOCCUS PYOGENES: NOT DETECTED
STREPTOCOCCUS SPECIES: NOT DETECTED
Staphylococcus aureus (BCID): NOT DETECTED
Staphylococcus species: NOT DETECTED
Streptococcus agalactiae: NOT DETECTED
Streptococcus pneumoniae: NOT DETECTED
Vancomycin resistance: NOT DETECTED

## 2015-10-02 LAB — CBC
HEMATOCRIT: 28.1 % — AB (ref 36.0–46.0)
HEMATOCRIT: 32.2 % — AB (ref 36.0–46.0)
HEMOGLOBIN: 8.9 g/dL — AB (ref 12.0–15.0)
Hemoglobin: 8.3 g/dL — ABNORMAL LOW (ref 12.0–15.0)
MCH: 28.4 pg (ref 26.0–34.0)
MCH: 28.5 pg (ref 26.0–34.0)
MCHC: 29.5 g/dL — AB (ref 30.0–36.0)
MCHC: 27.6 g/dL — AB (ref 30.0–36.0)
MCV: 96.2 fL (ref 78.0–100.0)
MCV: 103.2 fL — AB (ref 78.0–100.0)
PLATELETS: 358 10*3/uL (ref 150–400)
Platelets: 424 10*3/uL — ABNORMAL HIGH (ref 150–400)
RBC: 2.92 MIL/uL — ABNORMAL LOW (ref 3.87–5.11)
RBC: 3.12 MIL/uL — ABNORMAL LOW (ref 3.87–5.11)
RDW: 19.1 % — AB (ref 11.5–15.5)
RDW: 19.3 % — ABNORMAL HIGH (ref 11.5–15.5)
WBC: 25.9 10*3/uL — ABNORMAL HIGH (ref 4.0–10.5)
WBC: 41.5 10*3/uL — ABNORMAL HIGH (ref 4.0–10.5)

## 2015-10-02 LAB — GLUCOSE, CAPILLARY
GLUCOSE-CAPILLARY: 107 mg/dL — AB (ref 65–99)
Glucose-Capillary: 107 mg/dL — ABNORMAL HIGH (ref 65–99)
Glucose-Capillary: 118 mg/dL — ABNORMAL HIGH (ref 65–99)
Glucose-Capillary: 122 mg/dL — ABNORMAL HIGH (ref 65–99)
Glucose-Capillary: 89 mg/dL (ref 65–99)

## 2015-10-02 LAB — COMPREHENSIVE METABOLIC PANEL
ALT: 22 U/L (ref 14–54)
ANION GAP: 14 (ref 5–15)
AST: 56 U/L — ABNORMAL HIGH (ref 15–41)
Albumin: 1 g/dL — ABNORMAL LOW (ref 3.5–5.0)
Alkaline Phosphatase: 110 U/L (ref 38–126)
BUN: 17 mg/dL (ref 6–20)
CO2: 9 mmol/L — ABNORMAL LOW (ref 22–32)
Calcium: 5.4 mg/dL — CL (ref 8.9–10.3)
Chloride: 113 mmol/L — ABNORMAL HIGH (ref 101–111)
Creatinine, Ser: 1.18 mg/dL — ABNORMAL HIGH (ref 0.44–1.00)
GFR calc Af Amer: 53 mL/min — ABNORMAL LOW (ref >60)
GFR, EST NON AFRICAN AMERICAN: 46 mL/min — AB (ref >60)
Glucose, Bld: 134 mg/dL — ABNORMAL HIGH (ref 65–99)
POTASSIUM: 4.2 mmol/L (ref 3.5–5.1)
Sodium: 136 mmol/L (ref 135–145)
TOTAL PROTEIN: 3.2 g/dL — AB (ref 6.5–8.1)
Total Bilirubin: 0.4 mg/dL (ref 0.3–1.2)

## 2015-10-02 LAB — CBC WITH DIFFERENTIAL/PLATELET
BAND NEUTROPHILS: 7 %
BASOS PCT: 0 %
Basophils Absolute: 0 10*3/uL (ref 0.0–0.1)
EOS PCT: 0 %
Eosinophils Absolute: 0 10*3/uL (ref 0.0–0.7)
HCT: 24.6 % — ABNORMAL LOW (ref 36.0–46.0)
Hemoglobin: 6.9 g/dL — CL (ref 12.0–15.0)
Lymphocytes Relative: 4 %
Lymphs Abs: 1.6 10*3/uL (ref 0.7–4.0)
MCH: 28.3 pg (ref 26.0–34.0)
MCHC: 28 g/dL — ABNORMAL LOW (ref 30.0–36.0)
MCV: 100.8 fL — AB (ref 78.0–100.0)
MONO ABS: 1.2 10*3/uL — AB (ref 0.1–1.0)
MYELOCYTES: 3 %
Metamyelocytes Relative: 2 %
Monocytes Relative: 3 %
NEUTROS PCT: 81 %
Neutro Abs: 37.5 10*3/uL — ABNORMAL HIGH (ref 1.7–7.7)
PLATELETS: 268 10*3/uL (ref 150–400)
RBC: 2.44 MIL/uL — ABNORMAL LOW (ref 3.87–5.11)
RDW: 18.8 % — ABNORMAL HIGH (ref 11.5–15.5)
WBC: 40.3 10*3/uL — ABNORMAL HIGH (ref 4.0–10.5)

## 2015-10-02 LAB — BLOOD GAS, ARTERIAL
Acid-base deficit: 17.9 mmol/L — ABNORMAL HIGH (ref 0.0–2.0)
BICARBONATE: 8.2 meq/L — AB (ref 20.0–24.0)
Drawn by: 308601
FIO2: 0.3
LHR: 16 {breaths}/min
O2 Saturation: 96.8 %
PEEP: 5 cmH2O
PO2 ART: 135 mmHg — AB (ref 80.0–100.0)
Patient temperature: 98.6
TCO2: 8.1 mmol/L (ref 0–100)
VT: 500 mL
pCO2 arterial: 20.6 mmHg — ABNORMAL LOW (ref 35.0–45.0)
pH, Arterial: 7.226 — ABNORMAL LOW (ref 7.350–7.450)

## 2015-10-02 LAB — BASIC METABOLIC PANEL
Anion gap: 18 — ABNORMAL HIGH (ref 5–15)
BUN: 19 mg/dL (ref 6–20)
CHLORIDE: 114 mmol/L — AB (ref 101–111)
CREATININE: 1.54 mg/dL — AB (ref 0.44–1.00)
Calcium: 5.8 mg/dL — CL (ref 8.9–10.3)
GFR calc non Af Amer: 33 mL/min — ABNORMAL LOW (ref >60)
GFR, EST AFRICAN AMERICAN: 39 mL/min — AB (ref >60)
GLUCOSE: 82 mg/dL (ref 65–99)
Potassium: 5.4 mmol/L — ABNORMAL HIGH (ref 3.5–5.1)
Sodium: 136 mmol/L (ref 135–145)

## 2015-10-02 LAB — URINE CULTURE: Culture: NO GROWTH

## 2015-10-02 LAB — LACTIC ACID, PLASMA
LACTIC ACID, VENOUS: 13 mmol/L — AB (ref 0.5–1.9)
LACTIC ACID, VENOUS: 8.1 mmol/L — AB (ref 0.5–1.9)
Lactic Acid, Venous: 8.3 mmol/L (ref 0.5–1.9)
Lactic Acid, Venous: 12.5 mmol/L (ref 0.5–1.9)

## 2015-10-02 LAB — PHOSPHORUS: PHOSPHORUS: 5.7 mg/dL — AB (ref 2.5–4.6)

## 2015-10-02 LAB — CORTISOL

## 2015-10-02 LAB — MAGNESIUM: MAGNESIUM: 1 mg/dL — AB (ref 1.7–2.4)

## 2015-10-02 LAB — TROPONIN I: Troponin I: 0.05 ng/mL (ref <0.03)

## 2015-10-02 LAB — PREPARE RBC (CROSSMATCH)

## 2015-10-02 LAB — MRSA PCR SCREENING: MRSA BY PCR: NEGATIVE

## 2015-10-02 MED ORDER — SODIUM CHLORIDE 0.9 % IV BOLUS (SEPSIS)
1000.0000 mL | Freq: Once | INTRAVENOUS | Status: AC
  Administered 2015-10-02: 1000 mL via INTRAVENOUS

## 2015-10-02 MED ORDER — SODIUM BICARBONATE 8.4 % IV SOLN
50.0000 meq | Freq: Once | INTRAVENOUS | Status: AC
Start: 2015-10-02 — End: 2015-10-02
  Administered 2015-10-02: 50 meq via INTRAVENOUS
  Filled 2015-10-02 (×2): qty 50

## 2015-10-02 MED ORDER — VASOPRESSIN 20 UNIT/ML IV SOLN
0.0400 [IU]/min | INTRAVENOUS | Status: DC
  Administered 2015-10-02: 0.04 [IU]/min via INTRAVENOUS
  Filled 2015-10-02: qty 2

## 2015-10-02 MED ORDER — SODIUM CHLORIDE 0.9 % IV SOLN
1.0000 g | Freq: Once | INTRAVENOUS | Status: AC
  Administered 2015-10-02: 1 g via INTRAVENOUS
  Filled 2015-10-02: qty 10

## 2015-10-02 MED ORDER — NOREPINEPHRINE BITARTRATE 1 MG/ML IV SOLN
5.0000 ug/min | INTRAVENOUS | Status: DC
  Administered 2015-10-02: 30 ug/min via INTRAVENOUS
  Administered 2015-10-02: 23 ug/min via INTRAVENOUS
  Administered 2015-10-03: 28 ug/min via INTRAVENOUS
  Filled 2015-10-02 (×3): qty 16

## 2015-10-02 MED ORDER — SODIUM CHLORIDE 0.9 % IV BOLUS (SEPSIS)
3000.0000 mL | Freq: Once | INTRAVENOUS | Status: AC
  Administered 2015-10-02: 3000 mL via INTRAVENOUS

## 2015-10-02 MED ORDER — ANTISEPTIC ORAL RINSE SOLUTION (CORINZ)
7.0000 mL | Freq: Four times a day (QID) | OROMUCOSAL | Status: DC
  Administered 2015-10-02 – 2015-10-03 (×2): 7 mL via OROMUCOSAL

## 2015-10-02 MED ORDER — SODIUM BICARBONATE 8.4 % IV SOLN
INTRAVENOUS | Status: DC
  Administered 2015-10-02 – 2015-10-03 (×3): via INTRAVENOUS
  Filled 2015-10-02 (×3): qty 150

## 2015-10-02 MED ORDER — MAGNESIUM SULFATE 2 GM/50ML IV SOLN
2.0000 g | Freq: Once | INTRAVENOUS | Status: AC
  Administered 2015-10-02: 2 g via INTRAVENOUS
  Filled 2015-10-02: qty 50

## 2015-10-02 MED ORDER — SODIUM CHLORIDE 0.9% FLUSH
10.0000 mL | Freq: Two times a day (BID) | INTRAVENOUS | Status: DC
  Administered 2015-10-02 (×2): 10 mL

## 2015-10-02 MED ORDER — SODIUM CHLORIDE 0.9 % IV SOLN
500.0000 mg | Freq: Three times a day (TID) | INTRAVENOUS | Status: DC
  Administered 2015-10-02 (×3): 500 mg via INTRAVENOUS
  Filled 2015-10-02 (×4): qty 500

## 2015-10-02 MED ORDER — CHLORHEXIDINE GLUCONATE 0.12% ORAL RINSE (MEDLINE KIT)
15.0000 mL | Freq: Two times a day (BID) | OROMUCOSAL | Status: DC
  Administered 2015-10-02: 15 mL via OROMUCOSAL

## 2015-10-02 MED ORDER — SODIUM BICARBONATE 8.4 % IV SOLN
100.0000 meq | Freq: Once | INTRAVENOUS | Status: AC
  Administered 2015-10-02: 100 meq via INTRAVENOUS
  Filled 2015-10-02: qty 150

## 2015-10-02 MED ORDER — PHENYLEPHRINE HCL 10 MG/ML IJ SOLN
30.0000 ug/min | INTRAVENOUS | Status: DC
  Administered 2015-10-02: 50 ug/min via INTRAVENOUS
  Filled 2015-10-02 (×2): qty 1

## 2015-10-02 MED ORDER — DEXTROSE 5 % IV SOLN
INTRAVENOUS | Status: DC
  Filled 2015-10-02: qty 1000

## 2015-10-02 MED ORDER — SODIUM CHLORIDE 0.9 % IV SOLN
Freq: Once | INTRAVENOUS | Status: AC
  Administered 2015-10-02: 10 mL via INTRAVENOUS

## 2015-10-02 MED ORDER — SODIUM CHLORIDE 0.9% FLUSH
10.0000 mL | INTRAVENOUS | Status: DC | PRN
Start: 2015-10-02 — End: 2015-10-03

## 2015-10-02 NOTE — Progress Notes (Signed)
1 Day Post-Op  Subjective: Intubated and critically ill  Objective: Vital signs in last 24 hours: Temp:  [96.2 F (35.7 C)-102.9 F (39.4 C)] 97.7 F (36.5 C) (08/19 0400) Pulse Rate:  [93-157] 154 (08/19 0810) Resp:  [0-32] 31 (08/19 0810) BP: (81-107)/(41-70) 105/68 (08/19 0322) SpO2:  [92 %-100 %] 100 % (08/19 0630) Arterial Line BP: (79-118)/(52-74) 87/52 (08/19 0810) FiO2 (%):  [30 %-100 %] 30 % (08/19 0322) Weight:  [88 kg (194 lb)] 88 kg (194 lb) (08/18 0952)    Intake/Output from previous day: 08/18 0701 - 08/19 0700 In: 8643.5 [I.V.:5443.5; IV Piggyback:200] Out: 1300 [Urine:900; Blood:400] Intake/Output this shift: No intake/output data recorded.  Resp: clear to auscultation bilaterally and on vent Cardio: regular rate and rhythm and tachycardic requiring pressors GI: soft, dressing clean. ostomy dusky but viable  Lab Results:   Recent Labs  10/18/15 1024 18-Oct-2015 1724 10/02/15 0350  WBC 10.3  --  25.9*  HGB 9.1* 7.1* 8.3*  HCT 29.9* 21.0* 28.1*  PLT 299  --  358   BMET  Recent Labs  Oct 18, 2015 1024 2015/10/18 1724 10/02/15 0350  NA 134* 138 136  K 4.5 3.5 4.2  CL 101  --  113*  CO2 20*  --  9*  GLUCOSE 101*  --  134*  BUN 23*  --  17  CREATININE 1.14*  --  1.18*  CALCIUM 7.3*  --  5.4*   PT/INR  Recent Labs  18-Oct-2015 1938  LABPROT 32.0*  INR 3.02   ABG  Recent Labs  10-18-2015 1935 10/02/15 0310  PHART 7.275* 7.226*  HCO3 16.9* 8.2*    Studies/Results: Ct Abdomen Pelvis W Contrast  Result Date: 2015-10-18 CLINICAL DATA:  History of diverticulitis with progressive the abdominal pain. Suprapubic and left lower quadrant abdominal pain. Febrile and hypotensive. Crohn's colitis. EXAM: CT ABDOMEN AND PELVIS WITH CONTRAST TECHNIQUE: Multidetector CT imaging of the abdomen and pelvis was performed using the standard protocol following bolus administration of intravenous contrast. CONTRAST:  ISOVUE-300 IOPAMIDOL (ISOVUE-300) INJECTION  61% COMPARISON:  CT of the abdomen and pelvis 07/25/2015 FINDINGS: Lower chest: Small bilateral pleural effusions are present. Associated airspace disease is noted bilaterally. Heart size is normal. No significant pericardial effusion is present. Hepatobiliary: Diffuse fatty infiltration of the liver is present. No focal lesions are evident. Upper contour is within normal limits. The common bile duct and gallbladder are within normal limits. Pancreas: There is diffuse fatty infiltration liver. No discrete inflammation is present. Free air extends around the pancreas within the lesser sac. No focal pancreatic mass lesion is present. There is no significant ductal dilation. Spleen: Within normal limits Adrenals/Urinary Tract: The adrenal glands are normal bilaterally. Mild renal atrophy is present. A punctate nonobstructing stone at the lower pole of the right kidney is stable. There is no obstruction. The ureters are within normal limits. The urinary bladder is normal. Stomach/Bowel: The stomach and duodenum are within normal limits. The small bowel is unremarkable. The appendix is not discretely visualized. The ascending and transverse colon are somewhat featureless. Extensive inflammatory changes are present in the descending and sigmoid colon. There is a focal collection adjacent to the descending colon image 44 of series 3 compatible with focal perforation and probable abscess. Extensive free air is compatible with bowel perforation, likely related to this particular site at the colon. Additional inflammatory changes are present in more distal descending and proximal sigmoid colon associated with diverticular change. No other discrete fluid collection or abscess is  present. Vascular/Lymphatic: No significant adenopathy is present. No focal vascular lesions are present. Reproductive: Hysterectomy is noted. The adnexa are within normal limits for age. Other: Extensive free air is present. This is centered about the  descending colon and extends into the lesser sac. A small amount of free fluid is noted. Musculoskeletal: For mild rightward curvature is present in the lumbar spine, centered at L2. Vertebral body heights alignment are maintained. Endplate changes are noted. The bony pelvis is intact. 11 mm sclerotic lesion in the right iliac bone likely represents a bone island. No other focal sclerotic lesions are present. IMPRESSION: 1. Bowel perforation. The site of perforation appears to be the descending/sigmoid colon. 2. Focal collection of contrast adjacent to the descending colon presumably representing the site of perforation. The collection measures 2.0 x 2.7 x 1.5 cm. This may represent a developing abscess. 3. Extensive inflammatory changes about the distal descending proximal sigmoid colon compatible with diverticulitis. 4. In addition to the diverticular disease, there is mild wall thickening of the more proximal colon. This is rather featureless. Findings are compatible with Crohn's disease or ulcerative colitis. 5. Small amount of free fluid is noted. 6. Diffuse fatty infiltration of the liver. 7. Small bilateral pleural effusions and associated atelectasis. These results were called by telephone at the time of interpretation on 2015-06-13 at 2:05 pm to Dr. Raeford RazorSTEPHEN KOHUT , who verbally acknowledged these results. Electronically Signed   By: Marin Robertshristopher  Mattern M.D.   On: 02017-04-30 14:12   Dg Chest Port 1 View  Result Date: 10/02/2015 CLINICAL DATA:  Hypoxia EXAM: PORTABLE CHEST 1 VIEW COMPARISON:  October 01, 2015 FINDINGS: Endotracheal tube tip is 3.1 cm above the carina. Nasogastric tube tip and side port are in the region of the stomach. No pneumothorax. There is persistent atelectatic change in the left lower lobe with small left effusion. There is slight interstitial edema. There is mild cardiomegaly with mild pulmonary venous hypertension. No adenopathy evident. IMPRESSION: Tube positions as described  without pneumothorax. Findings indicative of a degree of congestive heart failure. Atelectatic change left base noted. Electronically Signed   By: Bretta BangWilliam  Woodruff III M.D.   On: 10/02/2015 07:15   Dg Chest Port 1 View  Result Date: 2015-06-13 CLINICAL DATA:  Acute respiratory failure. EXAM: PORTABLE CHEST 1 VIEW COMPARISON:  One-view chest x-ray 02017-04-30 FINDINGS: The patient has been intubated. The endotracheal tube terminates 2 cm above the carina and could be pulled back 1-2 cm for more optimal positioning. The side port of the NG tube is in the fundus stomach. Lung volumes are low. A small left pleural effusion is present. Increased interstitial markings are noted bilaterally. IMPRESSION: 1. Interval intubation. The endotracheal tube terminates 2 cm above the carina and could be pulled back 1-2 cm for more optimal position. 2. Increased diffuse interstitial pattern suggesting edema or less likely infection. 3. Small left pleural effusion. Electronically Signed   By: Marin Robertshristopher  Mattern M.D.   On: 02017-04-30 19:16   Dg Chest Portable 1 View  Result Date: 2015-06-13 CLINICAL DATA:  Fever EXAM: PORTABLE CHEST 1 VIEW COMPARISON:  September 29, 2015 FINDINGS: There is no edema or consolidation. The heart size and pulmonary vascularity are normal. No adenopathy. No bone lesions. IMPRESSION: No edema or consolidation. Electronically Signed   By: Bretta BangWilliam  Woodruff III M.D.   On: 02017-04-30 10:51    Anti-infectives: Anti-infectives    Start     Dose/Rate Route Frequency Ordered Stop   10/02/15 1600  levofloxacin (LEVAQUIN)  IVPB 750 mg  Status:  Discontinued     750 mg 100 mL/hr over 90 Minutes Intravenous Every 24 hours 09-Oct-2015 1140 10/02/15 0741   10/02/15 0800  imipenem-cilastatin (PRIMAXIN) 500 mg in sodium chloride 0.9 % 100 mL IVPB     500 mg 200 mL/hr over 30 Minutes Intravenous Every 8 hours 10/02/15 0746     10/02/15 0200  vancomycin (VANCOCIN) IVPB 750 mg/150 ml premix     750 mg 150  mL/hr over 60 Minutes Intravenous Every 12 hours 2015-10-09 1141     10/09/2015 1800  aztreonam (AZACTAM) 1 g in dextrose 5 % 50 mL IVPB  Status:  Discontinued     1 g 100 mL/hr over 30 Minutes Intravenous Every 8 hours 10-09-15 1141 10/02/15 0741   10/09/15 1030  levofloxacin (LEVAQUIN) IVPB 750 mg     750 mg 100 mL/hr over 90 Minutes Intravenous  Once 09-Oct-2015 1018 09-Oct-2015 1329   10-09-2015 1030  aztreonam (AZACTAM) 2 g in dextrose 5 % 50 mL IVPB     2 g 100 mL/hr over 30 Minutes Intravenous  Once 10-09-2015 1018 10/09/2015 1130   Oct 09, 2015 1030  vancomycin (VANCOCIN) IVPB 1000 mg/200 mL premix     1,000 mg 200 mL/hr over 60 Minutes Intravenous  Once 10-09-2015 1018 09-Oct-2015 1453      Assessment/Plan: s/p Procedure(s): EXPLORATORY LAPAROTOMY (N/A) COLON RESECTION SIGMOID (N/A) Continue ng and bowel rest.  Septic shock. Continue pressors and fluid resuscitation. Would use colloid when you can. With INR at 3 would give FFP. Appreciate CCM VDRF per CCM. May need more sedation as she seems to be fighting a lot Continue vanc/primaxin  LOS: 1 day    TOTH III,Iliany Losier S 10/02/2015

## 2015-10-02 NOTE — Progress Notes (Signed)
Pharmacy Antibiotic Note  NA Jamie Wood is a 70 y.o. female recently hospitalized and diagnosed with Crohn's now on Remicade, GIB during same hospitalization requiring 8 units PRBCs, DVT related to PICC on apixaban, and CKD-III seen in ED 8/16 with chills, hypokalemia, returns 8/18 with chills and worsening weakness.  CT shows free air with concern for colonic perforation.  Pharmacy has been consulted for Vancomycin, Aztreonam, and Levaquin dosing for sepsis. Noted unknown allergy to penicillins, no history of cephalosporin use in EPIC.   8/18 s/p Exploratory laparotomy, subtotal colectomy with Hartmann pouch. 8/19  BCID results show E Coli, Lactic acid increasing (5.14 > 2.71> 3.8 > 8.1 > 8.3).  Per discussion with MD, d/c Aztreonam/Levaquin.  Pharmacy is consulted to dose Primaxin.  Plan:  Primaxin 500mg  IV q8h  Vancomycin 750 mg IV q12h - MD to narrow vanc today if appropriate.  Measure Vanc trough at steady state.  Follow up renal fxn, culture results, and clinical course.   Height: 5\' 7"  (170.2 cm) Weight: 194 lb (88 kg) IBW/kg (Calculated) : 61.6  Temp (24hrs), Avg:99 F (37.2 C), Min:96.2 F (35.7 C), Max:102.9 F (39.4 C)   Recent Labs Lab 09/29/15 1059 15-Oct-2015 1024 10-15-15 1033 15-Oct-2015 1304 10/15/15 1938 2015/10/15 2340 10/02/15 0350  WBC 10.7* 10.3  --   --   --   --  25.9*  CREATININE 0.94 1.14*  --   --   --   --  1.18*  LATICACIDVEN  --   --  5.14* 2.71* 3.8* 8.1* 8.3*    Estimated Creatinine Clearance: 51.3 mL/min (by C-G formula based on SCr of 1.18 mg/dL).    Allergies  Allergen Reactions  . Benadryl [Diphenhydramine Hcl] Other (See Comments)    Jittery  . Codeine Nausea And Vomiting    Pt is unable to recall all reactions to codeine  . Mobic [Meloxicam] Other (See Comments)    Due to stage III kidney disease  . Penicillins     Has patient had a PCN reaction causing immediate rash, facial/tongue/throat swelling, SOB or lightheadedness with  hypotension: unknown Has patient had a PCN reaction causing severe rash involving mucus membranes or skin necrosis: unknown Has patient had a PCN reaction that required hospitalization: yes, drs visit Has patient had a PCN reaction occurring within the last 10 years: yes If all of the above answers are "NO", then may proceed with Cephalosporin use.   Marland Kitchen Propoxyphene Hcl Nausea And Vomiting  . Tramadol Nausea Only    jittery    Antimicrobials this admission: 8/18 Aztreonam >> 8/19 8/18 Levaquin >> 8/19 8/18 Vancomycin >>  8/19 Primaxin >>   Dose adjustments this admission:   Microbiology results: 8/18 BCx: collected         BCID:   Escherichia coli DETECTED  8/18 UCx: ordered   Thank you for allowing pharmacy to be a part of this patient's care.  Lynann Beaver PharmD, BCPS Pager [phone] 10/02/2015 8:03 AM

## 2015-10-02 NOTE — Op Note (Signed)
[name], Jamie                  ACCOUNT NO.:  [id number]  MEDICAL RECORD NO.:  [id number]  LOCATION:  1239                         FACILITY:  [facility]  PHYSICIAN:  Thornton Park. Daphine Deutscher, MD  DATE OF BIRTH:  [birth year]  DATE OF PROCEDURE: DATE OF DISCHARGE:                              OPERATIVE REPORT   PREOPERATIVE DIAGNOSIS:  Colon perforation in a patient with a 13-month diagnosis of Crohn's or ulcerative colitis, had been on steroids and Remicade.  PROCEDURE:  Exploratory laparotomy, subtotal colectomy involving the sigmoid, descending, transverse colon bringing out an ascending colon colostomy with a Hartmann pouch distally.  SURGEON:  Thornton Park. Daphine Deutscher, MD  ASSISTANT:  Sandria Bales. Ezzard Standing, M.D.  ANESTHESIA:  General endotracheal.  DESCRIPTION OF PROCEDURE:  The patient was taken to OR 1 and given general anesthesia.  The abdomen was prepped with PCMX and draped sterilely.  A midline incision was made and carried down through the fascia into the abdomen.  The abdomen was opened.  There was some gas and some drainage which was slightly cloudy initially.  We could palpate the left colon, it was very hard and this included the splenic flexure and most of the transverse colon.  The firmness carried on down to the rectum where it was soft again.  When looked at the duodenal region, that looked to be okay.  I elected because of the extent of her disease to go and do a subtotal colectomy.  I went ahead and divide the colon near the rectum in a soft area with a contour stapler.  We divided the mesentery with the LigaSure.  We worked our way up around through the splenic flexure.  I divided the colon up in the hepatic flexure and mobilized a portion for end colostomy.  I went through the mesentery, and the omentum with the ligature and then went through the worst part of her colon that was in her splenic flexure, and this was just paper thin, and we removed that.  There was obviously  spillage accompanied with her perforation which was the reason we were there.  The colon was then removed.  We irrigated with several liters of saline (4).  Changed our gown and gloves, and I brought out the colostomy in the right upper quadrant.  Sponge and needle counts were reported as correct, and the closure was affected with double-stranded #1 PDS from above and below with good fascial bites, and the wound was then packed open.  The ostomy was brought out and matured with running locking sutures of 3-0 chromic.  The patient was taken straight over to the ICU postoperatively.  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     Thornton Park Daphine Deutscher, MD     MBM/MEDQ  D:  10/19/15  T:  10/02/2015  Job:  [id number]  cc:   Stacie Acres. Cliffton Asters, M.D. Fax: [phone]

## 2015-10-02 NOTE — Progress Notes (Signed)
CRITICAL VALUE ALERT  Critical value received:  Lactic Acid 13  Date of notification:  10/02/2015  Time of notification:  1615  Critical value read back:Yes.    Nurse who received alert:  Eddie Candle RN  MD notified (1st page):  Elink. Dr. Isaiah Serge  Time of first page:  1620  MD notified (2nd page):  Time of second page:  Responding MD:  Dr. Isaiah Serge  Time MD responded:  1620

## 2015-10-02 NOTE — Progress Notes (Signed)
eLink Physician-Brief Progress Note Patient Name: Jamie Wood DOB: 12-18-1945 MRN: [id number]   Date of Service  10/02/2015  HPI/Events of Note  Hb 6.9  eICU Interventions  Transfuse 1 unit PRBCs     Intervention Category Intermediate Interventions: Other:  [name] 10/02/2015, 7:01 PM

## 2015-10-02 NOTE — Progress Notes (Signed)
Pt has had labile b/ps during the night requiring levophed drip frequent titration. Pt has received fluid bolus x4 per MD orders as well.  Pt has elevated lactic acid CCM and CCS made aware.  Pt has fentanyl drip infusing also.  Abtx received per orders.  Pt is able to follow commands while on ventilator.  Generalized edema noted.  HR 100's to 140's ST.  Report given to day RN to follow-up with pt labs and vitals.

## 2015-10-02 NOTE — Progress Notes (Signed)
CRITICAL VALUE ALERT  Critical value received:  Hemoglobin 6.9  Date of notification:  10/02/2015  Time of notification:  1850  Critical value read back:Yes.    Nurse who received alert:  Eddie Candle RN  MD notified (1st page):  Elink. Dr. Isaiah Serge  Time of first page:  1850 Elink  MD notified (2nd page):  Time of second page:  Responding MD:  Dr. Isaiah Serge  Time MD responded:  [phone]

## 2015-10-02 NOTE — Progress Notes (Addendum)
   LB PCCM  Pt remains critically ill. She is on levophed at 40 mcg/kg/m. BP MAP 56.tachycardic. RR 30s. sats > 90%.  Sedated. Intubated. On vent. Dec BS on BLF. Tachycardic. Dec BS. Gr 2 edema. Cool extremities.  Urine output is also dropping. She is 8 L positive. Lactic acid is 12. Creatinine is elevated at 1.5. Potassium 5.4. Bicarbonate is 7. Rest of labs reviewed.   A> septic shock 2/2 abd peritonitis/E coli bacteremia. Severe metabolic acidosis. Severe lactic acidosis. Acute renal injury. Acute hypoxemic respiratory failure.  Plan : 1. Start vasopressin at 0.04 units per minute. 2. Start Neo-Synephrine to keep map more than 65 mmHg. 3. Her corrected calcium was still low at 8.2. We'll give 1 amp of calcium gluconate.  4. Continue bicarbonate drip. Will give 2 amps of NaHcO3 5. Continue broad-spectrum antibiotics. 6. Will ask  surgery if she can be placed on heparin drip as she has history of DVT and she was on liquids as an outpatient. 7. I tried updating patient's brother. Left a voicemail.    Pollie MeyerJ. Angelo A de Dios, MD 10/02/2015, 2:11 PM [place] [facility] Pager [phone] After 3 pm or if no answer, call [phone]

## 2015-10-02 NOTE — Progress Notes (Signed)
PHARMACY - PHYSICIAN COMMUNICATION CRITICAL VALUE ALERT - BLOOD CULTURE IDENTIFICATION (BCID)  Results for orders placed or performed during the hospital encounter of 01-15-2016  Blood Culture ID Panel (Reflexed) (Collected: 2015-05-25 10:19 AM)  Result Value Ref Range   Enterococcus species NOT DETECTED NOT DETECTED   Vancomycin resistance NOT DETECTED NOT DETECTED   Listeria monocytogenes NOT DETECTED NOT DETECTED   Staphylococcus species NOT DETECTED NOT DETECTED   Staphylococcus aureus NOT DETECTED NOT DETECTED   Methicillin resistance NOT DETECTED NOT DETECTED   Streptococcus species NOT DETECTED NOT DETECTED   Streptococcus agalactiae NOT DETECTED NOT DETECTED   Streptococcus pneumoniae NOT DETECTED NOT DETECTED   Streptococcus pyogenes NOT DETECTED NOT DETECTED   Acinetobacter baumannii NOT DETECTED NOT DETECTED   Enterobacteriaceae species DETECTED (A) NOT DETECTED   Enterobacter cloacae complex NOT DETECTED NOT DETECTED   Escherichia coli DETECTED (A) NOT DETECTED   Klebsiella oxytoca NOT DETECTED NOT DETECTED   Klebsiella pneumoniae NOT DETECTED NOT DETECTED   Proteus species NOT DETECTED NOT DETECTED   Serratia marcescens NOT DETECTED NOT DETECTED   Carbapenem resistance NOT DETECTED NOT DETECTED   Haemophilus influenzae NOT DETECTED NOT DETECTED   Neisseria meningitidis NOT DETECTED NOT DETECTED   Pseudomonas aeruginosa NOT DETECTED NOT DETECTED   Candida albicans NOT DETECTED NOT DETECTED   Candida glabrata NOT DETECTED NOT DETECTED   Candida krusei NOT DETECTED NOT DETECTED   Candida parapsilosis NOT DETECTED NOT DETECTED   Candida tropicalis NOT DETECTED NOT DETECTED    Antibiotic allergies:  PCN (not specified, no previous PCN or cephalosporin use in Epic records)  Name of physician (or Provider) Contacted: Louann SjogrenJose Angelo A de Dios, MD  Changes to prescribed antibiotics required: Change from Aztreonam/Levaquin to Primaxin due to critical illness, intra-abdominal  source, and PCN allergy.  MD to continue vancomycin for now, but will d/c if no risk factors for MRSA noted.  Lynann Beaverhristine Mickelle Goupil PharmD, BCPS Pager [phone] 10/02/2015 7:19 AM

## 2015-10-02 NOTE — Progress Notes (Signed)
eLink Physician-Brief Progress Note Patient Name: Youlanda MightyLinda S Laredo DOB: 11/15/1945 MRN: [id number]   Date of Service  10/02/2015  HPI/Events of Note  LA increased to 13  eICU Interventions  Bolus 1 lt NS Increase bicarb to 150cc/hr     Intervention Category Evaluation Type: Other  Dennies Coate 10/02/2015, 4:24 PM

## 2015-10-02 NOTE — Progress Notes (Signed)
   LB PCCM  I tried placing a left IJ on this patient earlier today. She had very small vessels. The left IJ was anterior to the left internal carotid. I was able to superficially aspirate  blood from the left IJ but no good flow. I aborted the procedure after 2 attempts.  Patient is a known vasculopath. She had a right PICC 2 months ago which clotted off. I asked the vascular team to place a PICC and they're able to place 1. In the left arm, all her vessels are very small.  Pollie Meyer, MD 10/02/2015, 2:19 PM [place] [facility] Pager [phone] After 3 pm or if no answer, call [phone]

## 2015-10-02 NOTE — Progress Notes (Signed)
    Recent Labs Lab 09/29/15 1059 10-13-2015 1024 10/13/2015 1724  NA 136 134* 138  K 2.7* 4.5 3.5  CL 103 101  --   CO2 24 20*  --   GLUCOSE 101* 101*  --   BUN 21* 23*  --   CREATININE 0.94 1.14*  --   CALCIUM 7.4* 7.3*  --      Recent Labs Lab October 13, 2015 1304 Oct 13, 2015 1938 10-13-15 2340  LATICACIDVEN 2.71* 3.8* 8.1*  PROCALCITON  --  57.15  --    Rising lactic acid post oop  On levo  Plan rn instructed to call CCS primary service about rising lactate 3L fluid bolus Recheck lactate at 3am  Dr. Kalman Shan, M.D., [facility].C.P Pulmonary and Critical Care Medicine Staff Physician [facility] [place] [facility] Pager: [phone], If no answer or between  15:00h - 7:00h: call [phone]  10/02/2015 12:39 AM

## 2015-10-02 NOTE — Progress Notes (Signed)
CRITICAL VALUE ALERT  Critical value received:  Lactic Acid-12.5, Calcium-5.8, Troponin-0.03  Date of notification:  10/02/2015  Time of notification:  1320  Critical value read back:Yes.    Nurse who received alert:  Eddie Candle RN  MD notified (1st page):  Dr. Christene Slates  Time of first page:  1400  MD notified (2nd page):  Time of second page:  Responding MD:  Dr. Christene Slates  Time MD responded:  1400

## 2015-10-02 NOTE — Progress Notes (Signed)
   LB PCCM :  I have extensively discussed the case with the patient's brother, Sam. I also discussed the case with the surgeon. Patient's overall prognosis is poor. She has multiple organs involved : Bacteremia, septic shock, renal failure, metabolic acidosis, lactic acidosis, demand ischemia, respiratory failure.  Brother mentioned that patient wants quality of life. Brother has made patient a full DO NOT RESUSCITATE. Brother also made the patient no hemodialysis.  The surgeon has been updated.  Will make pt DNR. No HD.   Additional critical care time on this patient since this morning, 30 minutes.    Pollie Meyer, MD 10/02/2015, 3:05 PM [place] [facility] Pager [phone] After 3 pm or if no answer, call [phone]

## 2015-10-02 NOTE — Progress Notes (Addendum)
Peripherally Inserted Central Catheter/Midline Placement  The IV Nurse has discussed with the patient and/or persons authorized to consent for the patient, the purpose of this procedure and the potential benefits and risks involved with this procedure.  The benefits include less needle sticks, lab draws from the catheter and patient may be discharged home with the catheter.  Risks include, but not limited to, infection, bleeding, blood clot (thrombus formation), and puncture of an artery; nerve damage and irregular heat beat.  Alternatives to this procedure were also discussed.  Brother at bedside gave consent.  PICC/Midline Placement Documentation  PICC Triple Lumen 10/02/15 PICC Right Brachial 36 cm 0 cm (Active)  Indication for Insertion or Continuance of Line Vasoactive infusions;Limited venous access - need for IV therapy >5 days (PICC only);Poor Vasculature-patient has had multiple peripheral attempts or PIVs lasting less than 24 hours;Prolonged intravenous therapies 10/02/2015 11:00 AM  Exposed Catheter (cm) 0 cm 10/02/2015 11:00 AM  Site Assessment Clean;Dry;Intact 10/02/2015 11:00 AM  Lumen #1 Status Flushed;Saline locked;Blood return noted 10/02/2015 11:00 AM  Lumen #2 Status Flushed;Saline locked;Blood return noted 10/02/2015 11:00 AM  Lumen #3 Status Flushed;Saline locked;Blood return noted 10/02/2015 11:00 AM  Dressing Type Transparent 10/02/2015 11:00 AM  Dressing Status Clean;Dry;Intact;Antimicrobial disc in place 10/02/2015 11:00 AM  Line Care Connections checked and tightened 10/02/2015 11:00 AM  Line Adjustment (NICU/IV Team Only) No 10/02/2015 11:00 AM  Dressing Intervention New dressing 10/02/2015 11:00 AM  Dressing Change Due 10/09/15 10/02/2015 11:00 AM       Elliot Dally 10/02/2015, 11:01 AM

## 2015-10-02 NOTE — Progress Notes (Signed)
Informed Dr. Clayborn Bigness about pt having oliguria and hypotension.  Pressors added.  See orders. Erick Blinks, RN

## 2015-10-02 NOTE — Progress Notes (Signed)
PULMONARY / CRITICAL CARE MEDICINE   Name: Jamie Wood MRN: [id number] DOB: April 09, 1945    ADMISSION DATE:  2015-10-14 CONSULTATION DATE:  8/18  REFERRING MD:  EDP/ Dr. Juleen China  CHIEF COMPLAINT:  Septic Shock   HISTORY OF PRESENT ILLNESS:   70 year old female with PMH of Crohn's disease who presents to the hospital with fever, rigors, abdominal pain and vomiting.  Patient called EMS and was transferred to [facility] where in the ED she was found to be hypotensive with lactic acidosis that resolved after IVF resuscitation.  Patient remained hypotensive however and PCCM was called to admit.  Patient is mildly lethargic but easily arousable and able to follow commands and protect her airway.  Reports abdominal pain but no diarrhea.   PAST MEDICAL HISTORY :  She  has a past medical history of Acute lower GI bleeding; Allergic rhinitis; Anemia due to GI blood loss; Anxiety; Arthritis; Bilateral lower extremity edema; Crohn's colitis (HCC) (07/09/2015); Crohn's disease (HCC); Diverticulitis; DVT of right axillary vein, acute; GERD (gastroesophageal reflux disease); HLD (hyperlipidemia); Hypercholesterolemia; Insomnia; Osteoporosis; Physical deconditioning; Physical deconditioning; Protein calorie malnutrition (HCC); Renal insufficiency; Sleep apnea; Thyroid disease; and Vitamin D deficiency.  PAST SURGICAL HISTORY: She  has a past surgical history that includes Partial hysterectomy (1978); Tonsillectomy (1967); Appendectomy (1957); Colonoscopy; and Colonoscopy with propofol (N/A, 07/20/2015).  Allergies  Allergen Reactions  . Benadryl [Diphenhydramine Hcl] Other (See Comments)    Jittery  . Codeine Nausea And Vomiting    Pt is unable to recall all reactions to codeine  . Mobic [Meloxicam] Other (See Comments)    Due to stage III kidney disease  . Penicillins     Has patient had a PCN reaction causing immediate rash, facial/tongue/throat swelling, SOB or lightheadedness with hypotension: unknown Has  patient had a PCN reaction causing severe rash involving mucus membranes or skin necrosis: unknown Has patient had a PCN reaction that required hospitalization: yes, drs visit Has patient had a PCN reaction occurring within the last 10 years: yes If all of the above answers are "NO", then may proceed with Cephalosporin use.   Marland Kitchen Propoxyphene Hcl Nausea And Vomiting  . Tramadol Nausea Only    jittery    No current facility-administered medications on file prior to encounter.    Current Outpatient Prescriptions on File Prior to Encounter  Medication Sig  . acetaminophen (TYLENOL) 325 MG tablet Take 2 tablets (650 mg total) by mouth every 6 (six) hours as needed for mild pain (or Fever >/= 101).  Marland Kitchen ALPRAZolam (XANAX) 0.25 MG tablet Take 0.25 mg by mouth daily. Also takes at bedtime if needed.  Marland Kitchen apixaban (ELIQUIS) 5 MG TABS tablet Take 1 tablet (5 mg total) by mouth 2 (two) times daily.  Marland Kitchen FERREX 150 150 MG capsule Take 150 mg by mouth daily.  . fluticasone (FLONASE) 50 MCG/ACT nasal spray USE 2 SPRAYS EACH NOSTRIL DAILY AS NEEDED for allergies  . furosemide (LASIX) 20 MG tablet Take 20 mg by mouth daily.  . InFLIXimab (REMICADE IV) Inject into the vein every 8 (eight) weeks.  Marland Kitchen levothyroxine (SYNTHROID, LEVOTHROID) 100 MCG tablet Take 100 mcg by mouth daily before breakfast.  . pantoprazole (PROTONIX) 40 MG tablet Take 40 mg by mouth daily.  . potassium chloride SA (K-DUR,KLOR-CON) 20 MEQ tablet Take 20 mEq by mouth daily.  . simvastatin (ZOCOR) 20 MG tablet Take 20 mg by mouth daily with breakfast.   . VITAMIN D, CHOLECALCIFEROL, PO Take 5,000 Units by mouth  daily. D3  . Amino Acids-Protein Hydrolys (FEEDING SUPPLEMENT, PRO-STAT SUGAR FREE 64,) LIQD Take 30 mLs by mouth 2 (two) times daily between meals. (Patient not taking: Reported on 09/13/2015)  . diphenoxylate-atropine (LOMOTIL) 2.5-0.025 MG tablet Take 1 tablet by mouth 4 (four) times daily -  before meals and at bedtime. (Patient not  taking: Reported on 09/13/2015)  . Ferumoxytol (FERAHEME IV) Inject into the vein once.  . ondansetron (ZOFRAN) 4 MG tablet Take 1 tablet (4 mg total) by mouth every 6 (six) hours as needed for nausea. (Patient not taking: Reported on 09/13/2015)    FAMILY HISTORY:  Her indicated that her mother is deceased. She indicated that her father is deceased. She indicated that the status of her neg hx is unknown.    SOCIAL HISTORY: She  reports that she has never smoked. She has never used smokeless tobacco. She reports that she does not drink alcohol or use drugs.  REVIEW OF SYSTEMS:   Unable to obtain as pt is intubated.   SUBJECTIVE:  Patient went to surgery last night for bowel perforation. Chest CT scan with perforated descending/sigmoid colon. She ended up having an ostomy. They  "cleaned" her abdomen and closed her abdomen.  Remained septic overnight. Lactic acidosis persistent. Ended up being 7 L positive since admission. Started on levophed last night. Remains critically ill.  This morning, we tried to put a central line in the left IJ. Able to obtain. Please see separate note. Vascular team trying to put a right PICC.   VITAL SIGNS: BP 105/68   Pulse (!) 154   Temp 98.3 F (36.8 C) (Oral)   Resp (!) 31   Ht 5\' 7"  (1.702 m)   Wt 88 kg (194 lb)   SpO2 98%   BMI 30.38 kg/m   HEMODYNAMICS:    VENTILATOR SETTINGS: Vent Mode: PRVC FiO2 (%):  [30 %-100 %] 30 % Set Rate:  [16 bmp] 16 bmp Vt Set:  [500 mL] 500 mL PEEP:  [5 cmH20] 5 cmH20 Plateau Pressure:  [9 cmH20-16 cmH20] 13 cmH20  INTAKE / OUTPUT: I/O last 3 completed shifts: In: 8643.5 [I.V.:5443.5; Other:3000; IV Piggyback:200] Out: 1300 [Urine:900; Blood:400]  PHYSICAL EXAMINATION: General:  Arousable. Sedated. Follows simple commands. Neuro:  Cranial nerves grossly intact. No lateralizing sign. HEENT:  Pupils reactive bilaterally. No neck vein distention. Some hematoma in her left neck prior to my attempts for  IJ insertion. She has a right EJ. ET tube 7-1/2. Unable to examine mouth. Cardiovascular:  Tachycardic. Good s1/s2. (-) m/r/g Lungs:  Fair air entry. Crackles bibasilar. No wheezing or rhonchi. Abdomen:  Diminished bowel sounds. Soft. Obese. (+) ostomy Musculoskeletal:  Grade 1 edema. Warm extremities. Skin:  Skin was warm and dry. No rash.  LABS:  BMET  Recent Labs Lab 09/29/15 1059 09/19/2015 1024 09/15/2015 1724 10/02/15 0350  NA 136 134* 138 136  K 2.7* 4.5 3.5 4.2  CL 103 101  --  113*  CO2 24 20*  --  9*  BUN 21* 23*  --  17  CREATININE 0.94 1.14*  --  1.18*  GLUCOSE 101* 101*  --  134*    Electrolytes  Recent Labs Lab 09/29/15 1059 09/17/2015 1024 10/02/15 0350  CALCIUM 7.4* 7.3* 5.4*  MG  --   --  1.0*  PHOS  --   --  5.7*    CBC  Recent Labs Lab 09/29/15 1059 10/13/2015 1024 09/20/2015 1724 10/02/15 0350  WBC 10.7* 10.3  --  25.9*  HGB 8.9* 9.1* 7.1* 8.3*  HCT 29.1* 29.9* 21.0* 28.1*  PLT 369 299  --  358    Coag's  Recent Labs Lab 10/01/2015 1938  APTT 44*  INR 3.02    Sepsis Markers  Recent Labs Lab 09/26/2015 1938 09/23/2015 2340 10/02/15 0350  LATICACIDVEN 3.8* 8.1* 8.3*  PROCALCITON 57.15  --   --     ABG  Recent Labs Lab 03-Oct-2015 1724 10/05/2015 1935 10/02/15 0310  PHART 7.243* 7.275* 7.226*  PCO2ART 37.3 37.6 20.6*  PO2ART 114.0* 398* 135*    Liver Enzymes  Recent Labs Lab 09/29/15 1059 03-Oct-2015 1024 10/02/15 0350  AST 13* 57* 56*  ALT 14 23 22   ALKPHOS 82 146* 110  BILITOT 0.4 1.1 0.4  ALBUMIN 1.7* 1.5* 1.0*    Cardiac Enzymes  Recent Labs Lab Oct 03, 2015 1938  TROPONINI <0.03    Glucose  Recent Labs Lab 10/02/15 0815  GLUCAP 107*    Imaging Ct Abdomen Pelvis W Contrast  Result Date: 10/06/2015 CLINICAL DATA:  History of diverticulitis with progressive the abdominal pain. Suprapubic and left lower quadrant abdominal pain. Febrile and hypotensive. Crohn's colitis. EXAM: CT ABDOMEN AND PELVIS WITH CONTRAST  TECHNIQUE: Multidetector CT imaging of the abdomen and pelvis was performed using the standard protocol following bolus administration of intravenous contrast. CONTRAST:  ISOVUE-300 IOPAMIDOL (ISOVUE-300) INJECTION 61% COMPARISON:  CT of the abdomen and pelvis 07/25/2015 FINDINGS: Lower chest: Small bilateral pleural effusions are present. Associated airspace disease is noted bilaterally. Heart size is normal. No significant pericardial effusion is present. Hepatobiliary: Diffuse fatty infiltration of the liver is present. No focal lesions are evident. Upper contour is within normal limits. The common bile duct and gallbladder are within normal limits. Pancreas: There is diffuse fatty infiltration liver. No discrete inflammation is present. Free air extends around the pancreas within the lesser sac. No focal pancreatic mass lesion is present. There is no significant ductal dilation. Spleen: Within normal limits Adrenals/Urinary Tract: The adrenal glands are normal bilaterally. Mild renal atrophy is present. A punctate nonobstructing stone at the lower pole of the right kidney is stable. There is no obstruction. The ureters are within normal limits. The urinary bladder is normal. Stomach/Bowel: The stomach and duodenum are within normal limits. The small bowel is unremarkable. The appendix is not discretely visualized. The ascending and transverse colon are somewhat featureless. Extensive inflammatory changes are present in the descending and sigmoid colon. There is a focal collection adjacent to the descending colon image 44 of series 3 compatible with focal perforation and probable abscess. Extensive free air is compatible with bowel perforation, likely related to this particular site at the colon. Additional inflammatory changes are present in more distal descending and proximal sigmoid colon associated with diverticular change. No other discrete fluid collection or abscess is present. Vascular/Lymphatic: No  significant adenopathy is present. No focal vascular lesions are present. Reproductive: Hysterectomy is noted. The adnexa are within normal limits for age. Other: Extensive free air is present. This is centered about the descending colon and extends into the lesser sac. A small amount of free fluid is noted. Musculoskeletal: For mild rightward curvature is present in the lumbar spine, centered at L2. Vertebral body heights alignment are maintained. Endplate changes are noted. The bony pelvis is intact. 11 mm sclerotic lesion in the right iliac bone likely represents a bone island. No other focal sclerotic lesions are present. IMPRESSION: 1. Bowel perforation. The site of perforation appears to be the descending/sigmoid colon. 2. Focal  collection of contrast adjacent to the descending colon presumably representing the site of perforation. The collection measures 2.0 x 2.7 x 1.5 cm. This may represent a developing abscess. 3. Extensive inflammatory changes about the distal descending proximal sigmoid colon compatible with diverticulitis. 4. In addition to the diverticular disease, there is mild wall thickening of the more proximal colon. This is rather featureless. Findings are compatible with Crohn's disease or ulcerative colitis. 5. Small amount of free fluid is noted. 6. Diffuse fatty infiltration of the liver. 7. Small bilateral pleural effusions and associated atelectasis. These results were called by telephone at the time of interpretation on 10/12/2015 at 2:05 pm to Dr. Raeford Razor , who verbally acknowledged these results. Electronically Signed   By: Marin Roberts M.D.   On: 10/02/2015 14:12   Dg Chest Port 1 View  Result Date: 10/02/2015 CLINICAL DATA:  Hypoxia EXAM: PORTABLE CHEST 1 VIEW COMPARISON:  October 01, 2015 FINDINGS: Endotracheal tube tip is 3.1 cm above the carina. Nasogastric tube tip and side port are in the region of the stomach. No pneumothorax. There is persistent atelectatic  change in the left lower lobe with small left effusion. There is slight interstitial edema. There is mild cardiomegaly with mild pulmonary venous hypertension. No adenopathy evident. IMPRESSION: Tube positions as described without pneumothorax. Findings indicative of a degree of congestive heart failure. Atelectatic change left base noted. Electronically Signed   By: Bretta Bang III M.D.   On: 10/02/2015 07:15   Dg Chest Port 1 View  Result Date: 09/26/2015 CLINICAL DATA:  Acute respiratory failure. EXAM: PORTABLE CHEST 1 VIEW COMPARISON:  One-view chest x-ray 10/08/2015 FINDINGS: The patient has been intubated. The endotracheal tube terminates 2 cm above the carina and could be pulled back 1-2 cm for more optimal positioning. The side port of the NG tube is in the fundus stomach. Lung volumes are low. A small left pleural effusion is present. Increased interstitial markings are noted bilaterally. IMPRESSION: 1. Interval intubation. The endotracheal tube terminates 2 cm above the carina and could be pulled back 1-2 cm for more optimal position. 2. Increased diffuse interstitial pattern suggesting edema or less likely infection. 3. Small left pleural effusion. Electronically Signed   By: Marin Roberts M.D.   On: 09/24/2015 19:16     STUDIES:  Abdominal CT scan 8/18 bowel perforation at the site of descending/sigmoid colon. Extensive inflammatory changes distal descending proximal sigmoid: On compatible with diverticulitis. Fatty liver.   CULTURES: Blood culture 8/18  E coli Urine culture 8/18 >  MRSA 8/18 >   ANTIBIOTICS: Aztreonam, Levaquin 8/18 - 8/19 Imipenem 8/19 -  Vanc 8/18 -   SIGNIFICANT EVENTS: 8/18 admitted for abdominal pain. Perforated bowel. Went to surgery. Postop hypotensive, septic shock.  LINES/TUBES:   DISCUSSION: 70 year old female, recent diagnosis of Crohn's disease, on immunosuppressants, coming in with abdominal pain. Chest CT scan showed perforated  bowel most likely descending colon, sigmoid colon. She had exploratory laparotomy, colon resection, and ostomy on 8/18. Now with Escherichia coli bacteremia. Septic shock on pressors.   ASSESSMENT / PLAN:  PULMONARY A: Acute hypoxemic respiratory failure secondary to inability to protect airway, pulmonary edema. Obstructive sleep apnea. P:   Ventilatory bundle. Weaning for now. Diuresis once hemodynamically stable.  CARDIOVASCULAR A:  History of hypertension. Likely demand ischemia. P:  Trend troponin.  RENAL A:   Acute kidney injury secondary to sepsis, hypovolemia. Metabolic acidosis. P:   Patient is adequately resuscitated. Currently on Levophed.  We'll discontinue saline  and start bicarbonate drip.  GASTROINTESTINAL A:   Perforated bowel, descending colon/sigmoid colon. S/P ExLap with resection of colon, ostomy. Crohns dse/UC P:   Keep NPO Nutrition per surgery.   HEMATOLOGIC A:   Known vasculopath. R axillary vein DVT She had a PICC in her right arm couple months ago which clotted off. P:  She was on Eliquis at home.  DVT prophylaxis.  As surgery when okay to start heparin drip.  INFECTIOUS A:   Escherichia coli bacteremia secondary to perforated descending colon/sigmoid colon. Septic shock secondary to above. Lactic acidosis. P:   Which antibiotics to imipenem. Continue vancomycin for now. Discussed this with pharmacy.  ENDOCRINE A:   Hypothyroidism. P:    IV Synthroid.  NEUROLOGIC A:   Anxiety P:   RASS goal: 0- -1 Fentanyl drip Versed prn   FAMILY  - Updates: I extensively discussed the case with patient's brother. He is her only family. He understands the severity of her illness. He understands critical nature of her disease. Full code for now. If with clinical deterioration, need to discuss with him regarding CODE STATUS.  - Inter-disciplinary family meet or Palliative Care meeting due by:  8/25  Critical Care time with this patient  : 80 minutes.   Pollie MeyerJ. Angelo A de Dios, MD Pulmonary and Critical Care Medicine [facility] Pager: [phone] After 3 pm or if no response, call [phone]  10/02/2015, 10:50 AM

## 2015-10-03 ENCOUNTER — Other Ambulatory Visit (HOSPITAL_COMMUNITY): Payer: Medicare Other

## 2015-10-03 LAB — URINE CULTURE: Culture: NO GROWTH

## 2015-10-03 LAB — CBC WITH DIFFERENTIAL/PLATELET
BASOS PCT: 0 %
Basophils Absolute: 0 10*3/uL (ref 0.0–0.1)
EOS ABS: 0 10*3/uL (ref 0.0–0.7)
EOS PCT: 0 %
HEMATOCRIT: 31.7 % — AB (ref 36.0–46.0)
HEMOGLOBIN: 9.1 g/dL — AB (ref 12.0–15.0)
LYMPHS ABS: 3.8 10*3/uL (ref 0.7–4.0)
Lymphocytes Relative: 8 %
MCH: 29.7 pg (ref 26.0–34.0)
MCHC: 28.7 g/dL — AB (ref 30.0–36.0)
MCV: 103.6 fL — AB (ref 78.0–100.0)
METAMYELOCYTES PCT: 1 %
MYELOCYTES: 1 %
Monocytes Absolute: 1.4 10*3/uL — ABNORMAL HIGH (ref 0.1–1.0)
Monocytes Relative: 3 %
NEUTROS ABS: 42.4 10*3/uL — AB (ref 1.7–7.7)
NEUTROS PCT: 87 %
Platelets: 199 10*3/uL (ref 150–400)
RBC: 3.06 MIL/uL — ABNORMAL LOW (ref 3.87–5.11)
RDW: 19.3 % — AB (ref 11.5–15.5)
WBC: 47.6 10*3/uL — ABNORMAL HIGH (ref 4.0–10.5)

## 2015-10-03 LAB — GLUCOSE, CAPILLARY: GLUCOSE-CAPILLARY: 139 mg/dL — AB (ref 65–99)

## 2015-10-03 LAB — TROPONIN I
TROPONIN I: 0.03 ng/mL — AB (ref <0.03)
Troponin I: 0.11 ng/mL (ref <0.03)

## 2015-10-03 LAB — PHOSPHORUS: PHOSPHORUS: 10.4 mg/dL — AB (ref 2.5–4.6)

## 2015-10-03 LAB — MAGNESIUM: Magnesium: 1.9 mg/dL (ref 1.7–2.4)

## 2015-10-03 MED ORDER — ATROPINE SULFATE 1 % OP SOLN
2.0000 [drp] | Freq: Four times a day (QID) | OPHTHALMIC | Status: DC | PRN
  Filled 2015-10-03: qty 2

## 2015-10-03 MED ORDER — MORPHINE SULFATE 25 MG/ML IV SOLN
10.0000 mg/h | INTRAVENOUS | Status: DC
  Administered 2015-10-03: 10 mg/h via INTRAVENOUS
  Filled 2015-10-03: qty 10

## 2015-10-03 MED ORDER — MORPHINE BOLUS VIA INFUSION
5.0000 mg | INTRAVENOUS | Status: DC | PRN
  Filled 2015-10-03: qty 20

## 2015-10-04 ENCOUNTER — Encounter: Payer: Self-pay | Admitting: *Deleted

## 2015-10-04 ENCOUNTER — Telehealth: Payer: Self-pay

## 2015-10-04 ENCOUNTER — Other Ambulatory Visit: Payer: Self-pay | Admitting: *Deleted

## 2015-10-04 LAB — CULTURE, BLOOD (ROUTINE X 2)

## 2015-10-04 NOTE — Patient Outreach (Signed)
[facility]) Care Management  10/04/2015  Jamie Wood 1945/03/22 [id number]  Referral from MD office:  Per chart review patient is deceased as of 2015-08-17.  Plan: Will close case. Will notify MD office.   Colleen Can, RN BSN CCM Care Management Coordinator [facility] Care Management  [phone]

## 2015-10-04 NOTE — Telephone Encounter (Signed)
On 10/04/2015 I received a death certificate from [employer] [facility]). The death certificate is for cremation. The patient is a patient of Doctor Dios. The death certificate will be taken to [facility] (2100) tomorrow am (10/05/15) for signature.  On 10/07/2015 I received a call from Doctor Dios that he would be not the physician to sign this death certificate. The funeral home called and told them this information and they said they would be by this afternoon to pickup the death certificate.

## 2015-10-05 LAB — CULTURE, RESPIRATORY

## 2015-10-05 LAB — TYPE AND SCREEN
ABO/RH(D): A POS
ANTIBODY SCREEN: NEGATIVE
UNIT DIVISION: 0
UNIT DIVISION: 0
Unit division: 0

## 2015-10-05 LAB — CULTURE, RESPIRATORY W GRAM STAIN: Culture: NO GROWTH

## 2015-10-06 ENCOUNTER — Encounter (HOSPITAL_COMMUNITY): Payer: Self-pay | Admitting: Surgery

## 2015-10-08 NOTE — Telephone Encounter (Signed)
This encounter was created in error - please disregard.

## 2015-10-15 NOTE — Progress Notes (Signed)
While doing patient's bath this morning, ecchymotic spots were noted all over patient's body, especially on her back, chest and abdomen. [facility] MD was made aware of this development. Order for coags was received. Patient's brother Doreatha MartinSam, was also notified of patient's deteriorating condition.

## 2015-10-15 NOTE — Progress Notes (Signed)
Dr. Celene Skeen with [facility] made aware of patient's critical result of Troponin of 0.11, he was also made aware of patient's low urine output. New orders were received.

## 2015-10-15 NOTE — Discharge Summary (Signed)
Physician Discharge Summary  Patient ID: Jamie Wood MRN: [id number] DOB/AGE: 04-29-1945 70 y.o.  Admit date: 09/26/2015 Discharge date: 04-Oct-2015 Admission Diagnoses:  Perforated bowel and shock  Death Diagnoses:  same Active Problems:   Septic shock (HCC)   Perforated viscus   Acute respiratory failure (HCC)   Lactic acidosis   Surgery:  Exploratory laparotomy and colectomy with colostomy  Discharged Condition: dead  Hospital Course:   The patient was admitted to the [facility] MD service on Friday evening by me.  CCM was initially tasked with admission but in their absence, surgery admitted and took her to the OR.  She was in shock with an acute abdomen and perforation by CT.  Laparotomy was performed by me and a colectomy involving the sigmoid, left, and distal transverse colons was performed.  This area of the bowel looked by severely and chronically diseased from IBD with perforation around the spleen.    She was closed and takened straight to the ICU.  The patient was discussed with the CCM physician on call in the box and they agree to manage the intesive care segment of her hospitalization.  I never saw the patient again and was told that her shock, low perfusion, and CV collapse was never able to be reversed and the family agreed to a DNR and she died on Jun 26, 2022 morning.    Consults: CCM  Significant Diagnostic Studies: CT scan    Discharge Exam: Blood pressure 105/68, pulse 95, temperature 97.5 F (36.4 C), temperature source Axillary, resp. rate (!) 21, height 5\' 7"  (1.702 m), weight 88 kg (194 lb 0.1 oz), SpO2 95 %. Not performed by me  Disposition: 69-Expired     Medication List    ASK your doctor about these medications   acetaminophen 325 MG tablet Commonly known as:  TYLENOL Take 2 tablets (650 mg total) by mouth every 6 (six) hours as needed for mild pain (or Fever >/= 101).   ALPRAZolam 0.25 MG tablet Commonly known as:  XANAX Take 0.25 mg by mouth daily.  Also takes at bedtime if needed.   apixaban 5 MG Tabs tablet Commonly known as:  ELIQUIS Take 1 tablet (5 mg total) by mouth 2 (two) times daily.   diphenoxylate-atropine 2.5-0.025 MG tablet Commonly known as:  LOMOTIL Take 1 tablet by mouth 4 (four) times daily as needed for diarrhea or loose stools.   diphenoxylate-atropine 2.5-0.025 MG tablet Commonly known as:  LOMOTIL Take 1 tablet by mouth 4 (four) times daily -  before meals and at bedtime.   escitalopram 10 MG tablet Commonly known as:  LEXAPRO Take 5 mg by mouth daily.   feeding supplement (PRO-STAT SUGAR FREE 64) Liqd Take 30 mLs by mouth 2 (two) times daily between meals.   FERAHEME IV Inject into the vein once.   FERREX 150 150 MG capsule Generic drug:  iron polysaccharides Take 150 mg by mouth daily.   fluticasone 50 MCG/ACT nasal spray Commonly known as:  FLONASE USE 2 SPRAYS EACH NOSTRIL DAILY AS NEEDED for allergies   furosemide 20 MG tablet Commonly known as:  LASIX Take 20 mg by mouth daily.   levothyroxine 100 MCG tablet Commonly known as:  SYNTHROID, LEVOTHROID Take 100 mcg by mouth daily before breakfast.   ondansetron 4 MG tablet Commonly known as:  ZOFRAN Take 1 tablet (4 mg total) by mouth every 6 (six) hours as needed for nausea.   pantoprazole 40 MG tablet Commonly known as:  PROTONIX Take 40  mg by mouth daily.   potassium chloride SA 20 MEQ tablet Commonly known as:  K-DUR,KLOR-CON Take 20 mEq by mouth daily.   REMICADE IV Inject into the vein every 8 (eight) weeks.   simvastatin 20 MG tablet Commonly known as:  ZOCOR Take 20 mg by mouth daily with breakfast.   VITAMIN D (CHOLECALCIFEROL) PO Take 5,000 Units by mouth daily. D3        Signed: Valarie MerinoMARTIN,Canda Podgorski Wood 10/08/2015, 7:35 AM

## 2015-10-15 NOTE — Progress Notes (Signed)
eLink Physician-Brief Progress Note Patient Name: FRANSHESCA TEPPER DOB: [birth year] MRN: [id number]   Date of Service  10-30-2015  HPI/Events of Note  Patient's brother Sam and close friend updated at bedside via camera. Explained ongoing multisystem organ failure and high probability for further clinical deterioration and likely death. Both agree that the patient would not wish to prolong her suffering or be maintained in her current state. They want her to "pass peacefully". We discussed proceeding with full comfort care with pain treatment via morphine infusion as well as relief of dyspnea with morphine. They are in agreement with this plan of care. Nurse at bedside and updated via camera regarding plan of care at the same time of discussion with patient's family.   eICU Interventions  1. Terminal vent wean and extubation per protocol 2. Morphine infusion and bolus for relief of pain and dyspnea 3. Atropine drops sublingual for oral secretions 4. Plan to discontinue vasopressor infusion postextubation 5. Continue with full DO NOT RESUSCITATE status 6. Nurse to contact hospital chaplain for spiritual support      Intervention Category Major Interventions: Other:  Lawanda Cousins 10/30/15, 5:33 AM

## 2015-10-15 NOTE — Progress Notes (Signed)
Terminal extubation completed per facility protocol.

## 2015-10-15 NOTE — Progress Notes (Signed)
eLink Physician-Brief Progress Note Patient Name: ADAIRA WESTERVELT DOB: 1945/03/28 MRN: [id number]   Date of Service  09/26/2015  HPI/Events of Note  Nurse notified of diffuse bruising noted on bathing this morning. Can recheck shows patchy ecchymoses identified on abdomen & chest.   eICU Interventions  1. Sending morning labs now 2. Ordering coag's 3. Switch from BMP to CMP      Intervention Category Major Interventions: Other:  Lawanda Cousins 09/21/2015, 4:47 AM

## 2015-10-15 NOTE — Progress Notes (Signed)
Patient expired at [phone], family at bedside at time of expiration. Emotional support provided.

## 2015-10-15 NOTE — Progress Notes (Signed)
Chaplain received a request from the  Nursing Unit to provide spiritual care support for the family at the bedside of the patient in major life transition.  Chaplain provided a listening presence for the family as they shared life stories of their loved one.  A prayer of comfort, and peace was offered for them. Chaplain also informed the family of the process of needed information needed by the hospital for final arrangements for the patient. The family was appreciative of all the support they received from the staff. Chaplain Janell Quiet [phone]

## 2015-10-15 NOTE — Progress Notes (Signed)
eLink Physician-Brief Progress Note Patient Name: Jamie Wood DOB: 1946/02/10 MRN: [id number]   Date of Service  November 01, 2015  HPI/Events of Note  Notified by bedside nurse of elevated troponin I. Troponin I now 0.11 up from 0.05. Patient currently with septic shock on vasopressor support with acute renal failure. Prognosis is poor. Patient underwent subtotal colectomy on 8/18. Transfused 1 unit packed red blood cells on 8/19 for hemoglobin 6.9.   eICU Interventions  1. Await repeat hemoglobin/hematocrit posttransfusion 2. Goal hemoglobin 8.0 3. Holding off on systemic anticoagulation in the setting of septic shock and recent subtotal colectomy 4. Checking transthoracic echocardiogram to evaluate for new focal wall motion abnormality to suggest an acute ischemic event but suspect this represents demand ischemia      Intervention Category Major Interventions: Shock - evaluation and management  Lawanda Cousins 11/01/15, 12:50 AM

## 2015-10-15 DEATH — deceased

## 2015-10-27 ENCOUNTER — Encounter (HOSPITAL_COMMUNITY): Payer: Medicare Other

## 2015-11-18 NOTE — Telephone Encounter (Signed)
DONE

## 2017-08-15 IMAGING — CT CT ABD-PELV W/O CM
2 of 4 series · 17 of 46 positions shown, 19 images · non-contrast
Comparison: CT of the abdomen and pelvis from 06/25/2015

CLINICAL DATA: Acute onset of GI bleeding.  Initial encounter.

EXAM:
CT ABDOMEN AND PELVIS WITHOUT CONTRAST
TECHNIQUE: Multidetector CT imaging of the abdomen and pelvis was performed
following the standard protocol without IV contrast.

[Series 2: rtn a/p w/o · axial · non-contrast · 0.88mm/px · z∈[-515,-20]mm · 14 of 113 slices shown, 16 images]
[im 7/113  soft-tissue]
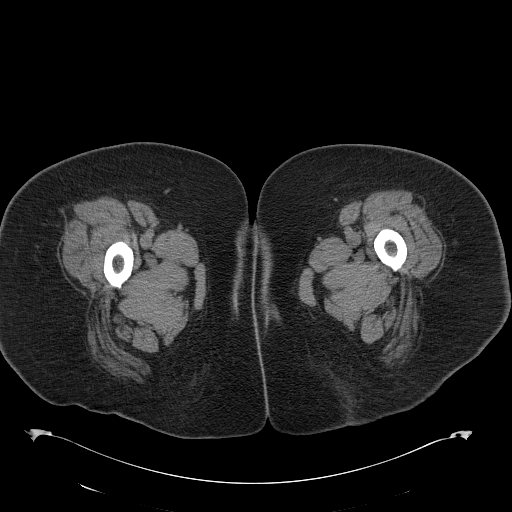
[im 7/113  bone]
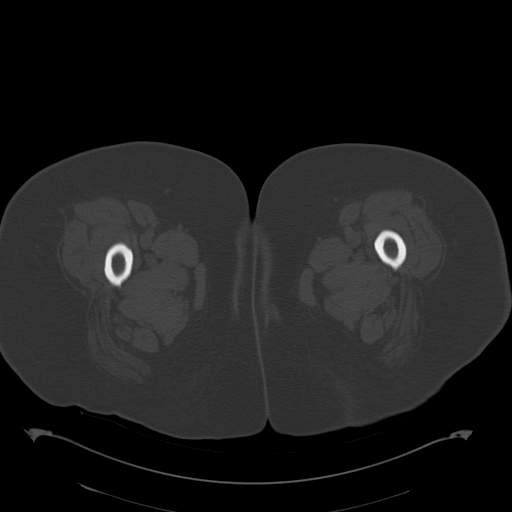
[im 14/113  soft-tissue]
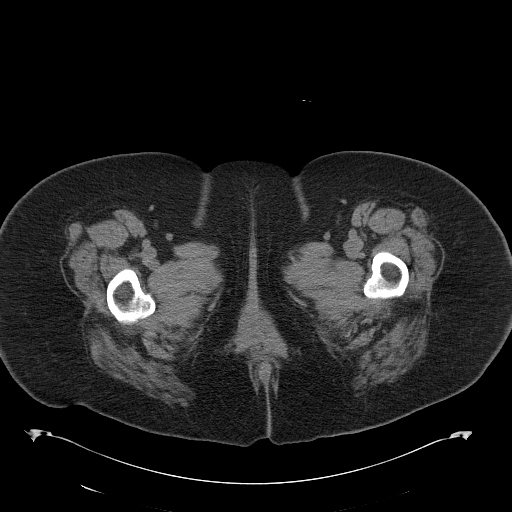
[im 20/113  soft-tissue]
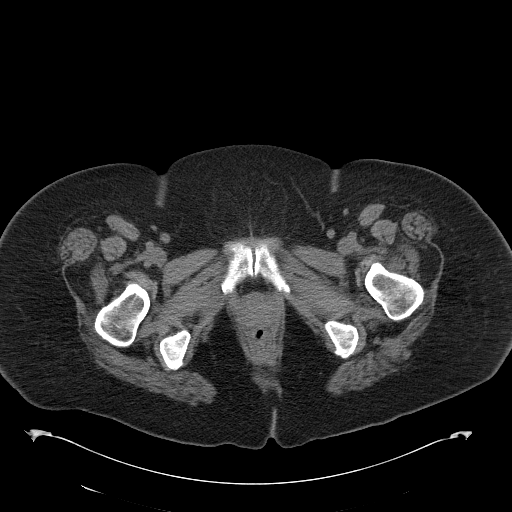
[im 33/113  soft-tissue]
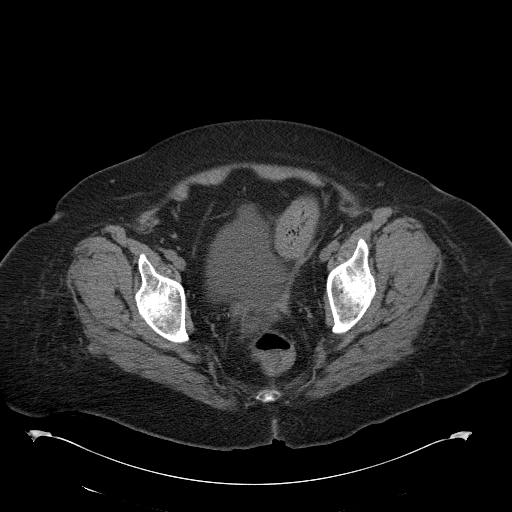
[im 40/113  soft-tissue]
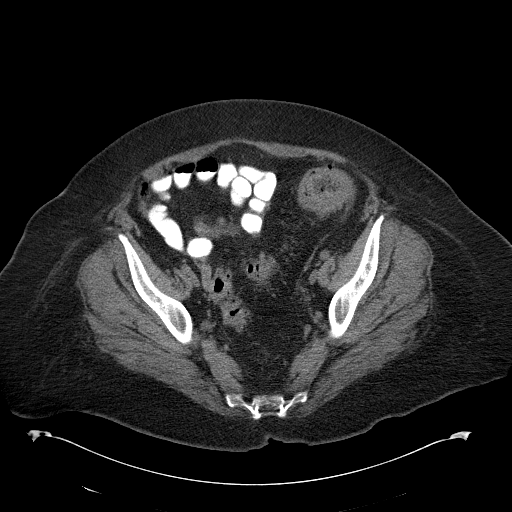
[im 47/113  soft-tissue]
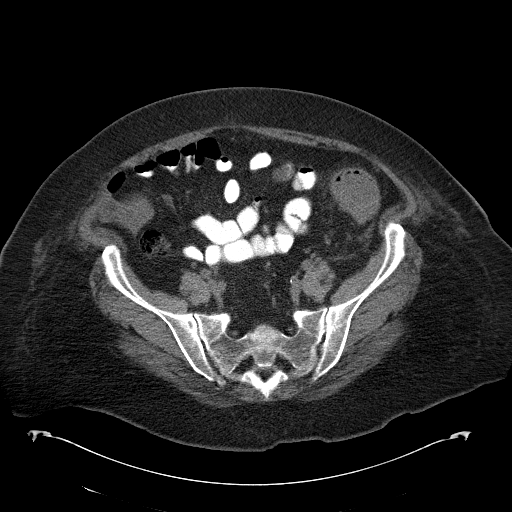
[im 53/113  soft-tissue]
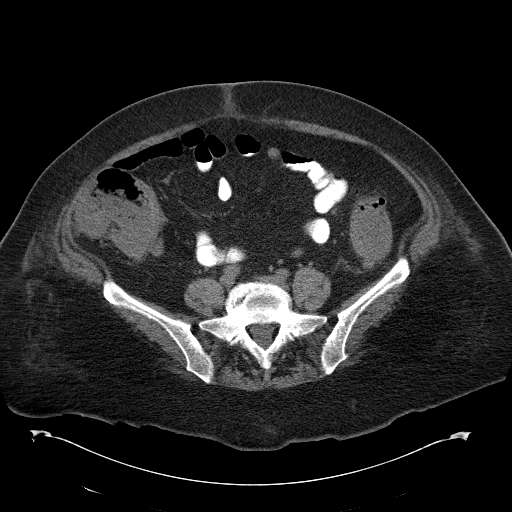
[im 60/113  soft-tissue]
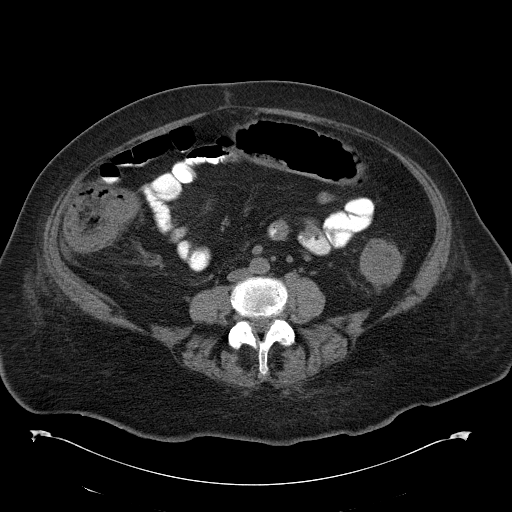
[im 66/113  soft-tissue]
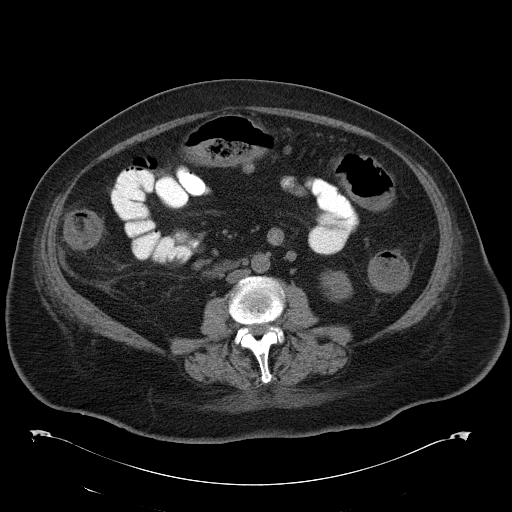
[im 66/113  bone]
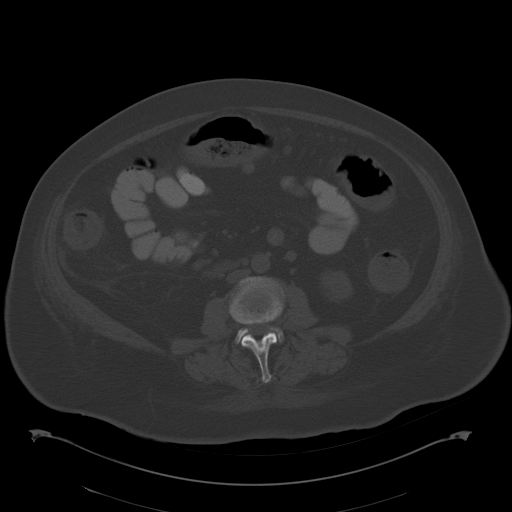
[im 73/113  soft-tissue]
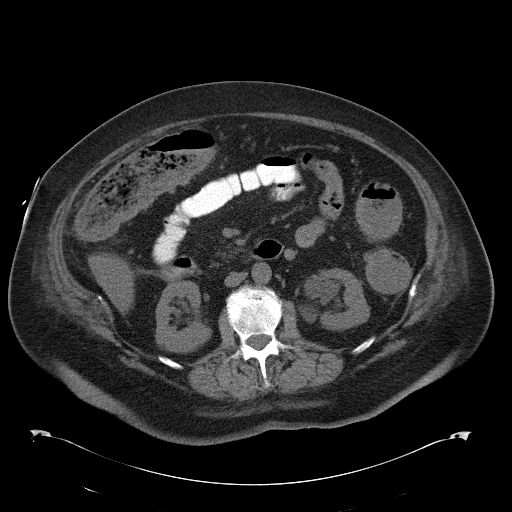
[im 86/113  soft-tissue]
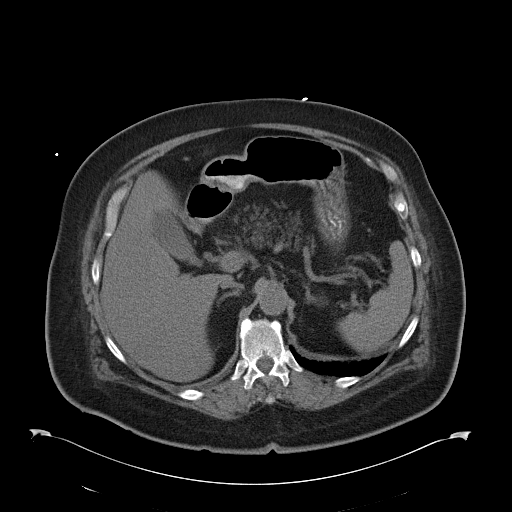
[im 93/113  soft-tissue]
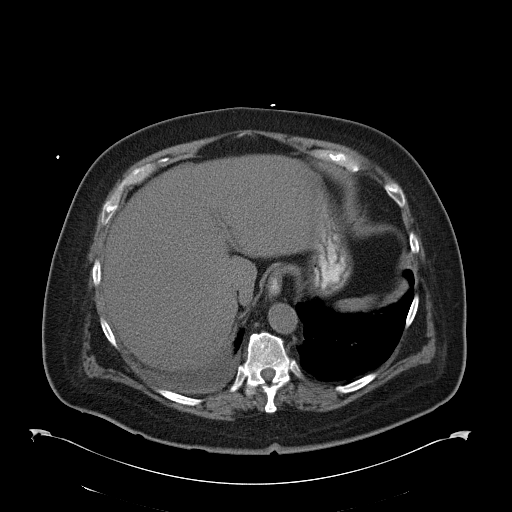
[im 99/113  soft-tissue]
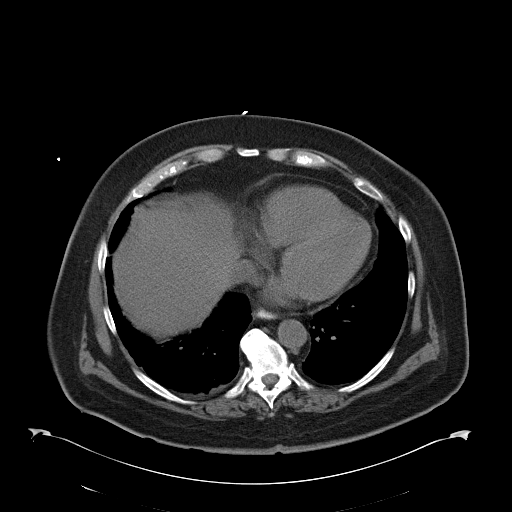
[im 106/113  soft-tissue]
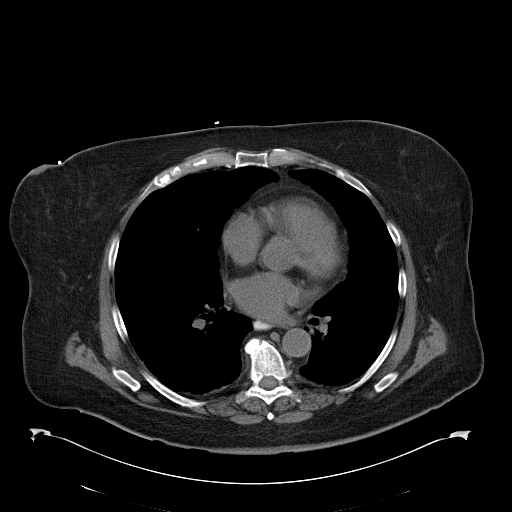

[Series 602: <mpr thick range> · coronal · 1.10mm/px · 3 of 158 slices shown]
[im 53/158  soft-tissue]
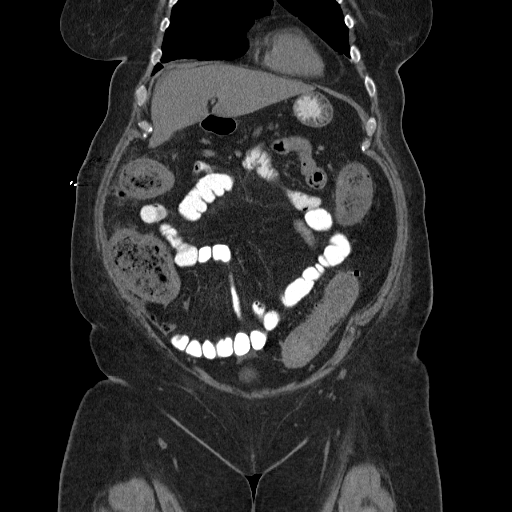
[im 70/158  soft-tissue]
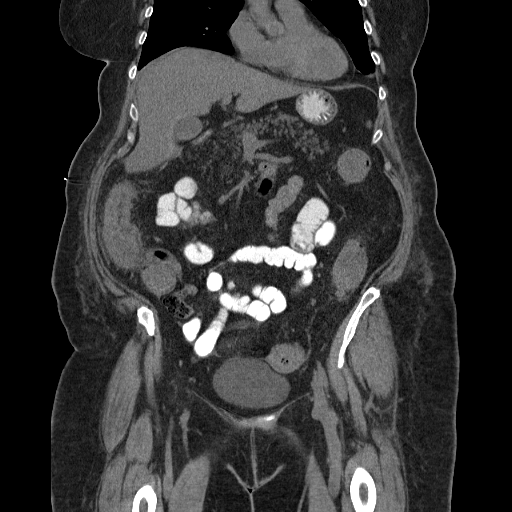
[im 88/158  soft-tissue]
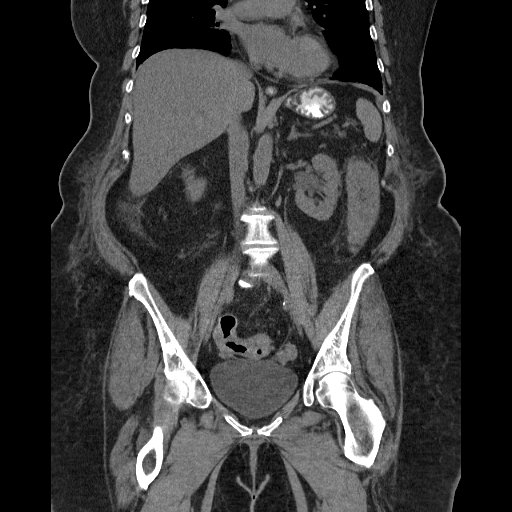

[17 of 46 positions shown; findings below may reference images not displayed]

FINDINGS: Trace right-sided pleural fluid is noted, with associated
atelectasis.

The liver and spleen are unremarkable in appearance. The gallbladder
is within normal limits. The pancreas and adrenal glands are
unremarkable.

Scattered bilateral parapelvic renal cysts are seen. Minimal
nonspecific perinephric stranding is noted bilaterally. There is no
evidence of hydronephrosis. No renal or ureteral stones are
identified.

The small bowel is unremarkable in appearance. The stomach is within
normal limits. No acute vascular abnormalities are seen.

The patient is status post appendectomy. Vague soft tissue
inflammation is noted along the ascending, descending and proximal
sigmoid colon, concerning for acute infectious or inflammatory
colitis. Underlying diverticulosis is noted along the transverse,
descending and sigmoid colon. Trace fluid is seen tracking along the
paracolic gutters.

The bladder is mildly distended and grossly unremarkable. The
patient is status post hysterectomy. The ovaries are grossly
symmetric. No suspicious adnexal masses are seen. No inguinal
lymphadenopathy is seen.

No acute osseous abnormalities are identified.
IMPRESSION: 1. Vague soft tissue inflammation along the ascending, descending
and proximal sigmoid colon, concerning for acute infectious or
inflammatory colitis. Trace fluid tracking along the paracolic
gutters.
2. Underlying diverticulosis along the transverse, descending and
sigmoid colon.
3. Scattered bilateral parapelvic renal cysts again noted.
4. Trace right-sided pleural fluid, with associated atelectasis.
